# Patient Record
Sex: Male | Born: 1940 | Race: White | Hispanic: No | Marital: Married | Smoking: Former smoker
Health system: Southern US, Community
[De-identification: ages and names within clinical notes are randomized; demographics above are authoritative.]

## PROBLEM LIST (undated history)

## (undated) ENCOUNTER — Observation Stay (HOSPITAL_COMMUNITY)
Admission: RE | Payer: No Typology Code available for payment source | Source: Ambulatory Visit | Admitting: Cardiovascular Disease

## (undated) DIAGNOSIS — E669 Obesity, unspecified: Secondary | ICD-10-CM

## (undated) DIAGNOSIS — I2510 Atherosclerotic heart disease of native coronary artery without angina pectoris: Secondary | ICD-10-CM

## (undated) DIAGNOSIS — I739 Peripheral vascular disease, unspecified: Secondary | ICD-10-CM

## (undated) DIAGNOSIS — D509 Iron deficiency anemia, unspecified: Secondary | ICD-10-CM

## (undated) DIAGNOSIS — I219 Acute myocardial infarction, unspecified: Secondary | ICD-10-CM

## (undated) DIAGNOSIS — F1010 Alcohol abuse, uncomplicated: Secondary | ICD-10-CM

## (undated) DIAGNOSIS — E119 Type 2 diabetes mellitus without complications: Secondary | ICD-10-CM

## (undated) DIAGNOSIS — R112 Nausea with vomiting, unspecified: Secondary | ICD-10-CM

## (undated) DIAGNOSIS — M1990 Unspecified osteoarthritis, unspecified site: Secondary | ICD-10-CM

## (undated) DIAGNOSIS — Z9889 Other specified postprocedural states: Secondary | ICD-10-CM

## (undated) DIAGNOSIS — N400 Enlarged prostate without lower urinary tract symptoms: Secondary | ICD-10-CM

## (undated) DIAGNOSIS — I10 Essential (primary) hypertension: Secondary | ICD-10-CM

## (undated) DIAGNOSIS — E785 Hyperlipidemia, unspecified: Secondary | ICD-10-CM

## (undated) HISTORY — PX: ABDOMINAL HERNIA REPAIR: SHX539

## (undated) HISTORY — DX: Alcohol abuse, uncomplicated: F10.10

## (undated) HISTORY — PX: CORONARY ANGIOPLASTY WITH STENT PLACEMENT: SHX49

## (undated) HISTORY — PX: SKIN CANCER DESTRUCTION: SHX778

## (undated) HISTORY — PX: CAROTID ENDARTERECTOMY: SUR193

## (undated) HISTORY — DX: Iron deficiency anemia, unspecified: D50.9

## (undated) HISTORY — PX: SHOULDER ARTHROSCOPY W/ ROTATOR CUFF REPAIR: SHX2400

## (undated) HISTORY — DX: Obesity, unspecified: E66.9

## (undated) HISTORY — DX: Peripheral vascular disease, unspecified: I73.9

## (undated) HISTORY — PX: KNEE CARTILAGE SURGERY: SHX688

## (undated) HISTORY — DX: Atherosclerotic heart disease of native coronary artery without angina pectoris: I25.10

## (undated) HISTORY — PX: HERNIA REPAIR: SHX51

## (undated) HISTORY — DX: Benign prostatic hyperplasia without lower urinary tract symptoms: N40.0

## (undated) HISTORY — PX: UMBILICAL HERNIA REPAIR: SHX196

## (undated) HISTORY — DX: Hyperlipidemia, unspecified: E78.5

## (undated) HISTORY — PX: INGUINAL HERNIA REPAIR: SUR1180

## (undated) HISTORY — DX: Essential (primary) hypertension: I10

## (undated) HISTORY — PX: KNEE ARTHROSCOPY: SHX127

---

## 1966-05-31 HISTORY — PX: EYE SURGERY: SHX253

## 1993-05-31 DIAGNOSIS — I219 Acute myocardial infarction, unspecified: Secondary | ICD-10-CM

## 1993-05-31 HISTORY — DX: Acute myocardial infarction, unspecified: I21.9

## 1997-08-29 ENCOUNTER — Encounter: Admission: RE | Admit: 1997-08-29 | Discharge: 1997-11-27 | Payer: Self-pay | Admitting: Family Medicine

## 1998-05-16 ENCOUNTER — Emergency Department (HOSPITAL_COMMUNITY): Admission: EM | Admit: 1998-05-16 | Discharge: 1998-05-16 | Payer: Self-pay | Admitting: Emergency Medicine

## 2001-01-06 ENCOUNTER — Ambulatory Visit (HOSPITAL_COMMUNITY): Admission: RE | Admit: 2001-01-06 | Discharge: 2001-01-06 | Payer: Self-pay | Admitting: Gastroenterology

## 2001-01-06 ENCOUNTER — Encounter (INDEPENDENT_AMBULATORY_CARE_PROVIDER_SITE_OTHER): Payer: Self-pay | Admitting: *Deleted

## 2003-06-01 DIAGNOSIS — I2510 Atherosclerotic heart disease of native coronary artery without angina pectoris: Secondary | ICD-10-CM

## 2003-06-01 HISTORY — DX: Atherosclerotic heart disease of native coronary artery without angina pectoris: I25.10

## 2003-06-01 HISTORY — PX: CORONARY ARTERY BYPASS GRAFT: SHX141

## 2003-06-01 HISTORY — PX: CARDIAC CATHETERIZATION: SHX172

## 2004-01-30 ENCOUNTER — Ambulatory Visit (HOSPITAL_COMMUNITY): Admission: RE | Admit: 2004-01-30 | Discharge: 2004-01-30 | Payer: Self-pay | Admitting: Interventional Cardiology

## 2004-02-24 ENCOUNTER — Encounter: Admission: RE | Admit: 2004-02-24 | Discharge: 2004-02-24 | Payer: Self-pay | Admitting: Interventional Cardiology

## 2004-02-25 ENCOUNTER — Inpatient Hospital Stay (HOSPITAL_BASED_OUTPATIENT_CLINIC_OR_DEPARTMENT_OTHER): Admission: RE | Admit: 2004-02-25 | Discharge: 2004-02-25 | Payer: Self-pay | Admitting: Interventional Cardiology

## 2004-04-03 ENCOUNTER — Ambulatory Visit: Payer: Self-pay | Admitting: Oncology

## 2004-04-17 ENCOUNTER — Encounter: Admission: RE | Admit: 2004-04-17 | Discharge: 2004-04-17 | Payer: Self-pay | Admitting: Cardiothoracic Surgery

## 2004-04-27 ENCOUNTER — Inpatient Hospital Stay (HOSPITAL_COMMUNITY): Admission: RE | Admit: 2004-04-27 | Discharge: 2004-05-03 | Payer: Self-pay | Admitting: Cardiothoracic Surgery

## 2004-05-15 ENCOUNTER — Encounter: Admission: RE | Admit: 2004-05-15 | Discharge: 2004-05-15 | Payer: Self-pay | Admitting: Cardiothoracic Surgery

## 2004-05-19 ENCOUNTER — Ambulatory Visit: Payer: Self-pay | Admitting: Oncology

## 2004-06-04 ENCOUNTER — Encounter: Admission: RE | Admit: 2004-06-04 | Discharge: 2004-06-04 | Payer: Self-pay | Admitting: Cardiothoracic Surgery

## 2004-06-08 ENCOUNTER — Encounter (HOSPITAL_COMMUNITY): Admission: RE | Admit: 2004-06-08 | Discharge: 2004-09-06 | Payer: Self-pay | Admitting: Interventional Cardiology

## 2004-07-03 ENCOUNTER — Encounter: Admission: RE | Admit: 2004-07-03 | Discharge: 2004-07-03 | Payer: Self-pay | Admitting: Cardiothoracic Surgery

## 2004-07-07 ENCOUNTER — Ambulatory Visit: Payer: Self-pay | Admitting: Oncology

## 2004-08-24 ENCOUNTER — Ambulatory Visit: Payer: Self-pay | Admitting: Oncology

## 2004-09-07 ENCOUNTER — Ambulatory Visit: Payer: Self-pay | Admitting: Oncology

## 2004-11-02 ENCOUNTER — Ambulatory Visit: Payer: Self-pay | Admitting: Oncology

## 2005-02-11 ENCOUNTER — Ambulatory Visit: Payer: Self-pay | Admitting: Oncology

## 2005-04-08 ENCOUNTER — Ambulatory Visit: Payer: Self-pay | Admitting: Oncology

## 2005-06-09 ENCOUNTER — Ambulatory Visit: Payer: Self-pay | Admitting: Oncology

## 2005-09-17 ENCOUNTER — Encounter: Admission: RE | Admit: 2005-09-17 | Discharge: 2005-09-17 | Payer: Self-pay | Admitting: Family Medicine

## 2005-10-01 ENCOUNTER — Ambulatory Visit: Payer: Self-pay | Admitting: Oncology

## 2005-10-05 LAB — COMPREHENSIVE METABOLIC PANEL
ALT: 16 U/L (ref 0–40)
CO2: 24 mEq/L (ref 19–32)
Calcium: 9.2 mg/dL (ref 8.4–10.5)
Chloride: 97 mEq/L (ref 96–112)
Creatinine, Ser: 0.8 mg/dL (ref 0.4–1.5)
Sodium: 133 mEq/L — ABNORMAL LOW (ref 135–145)
Total Protein: 7.6 g/dL (ref 6.0–8.3)

## 2005-10-05 LAB — CBC WITH DIFFERENTIAL/PLATELET
BASO%: 0.4 % (ref 0.0–2.0)
HCT: 39.6 % (ref 38.7–49.9)
MCHC: 33.9 g/dL (ref 32.0–35.9)
MONO#: 0.5 10*3/uL (ref 0.1–0.9)
NEUT#: 3.7 10*3/uL (ref 1.5–6.5)
NEUT%: 64.1 % (ref 40.0–75.0)
WBC: 5.8 10*3/uL (ref 4.0–10.0)
lymph#: 1 10*3/uL (ref 0.9–3.3)

## 2005-10-05 LAB — LACTATE DEHYDROGENASE: LDH: 157 U/L (ref 94–250)

## 2006-02-01 ENCOUNTER — Ambulatory Visit: Payer: Self-pay | Admitting: Oncology

## 2006-02-03 LAB — COMPREHENSIVE METABOLIC PANEL
ALT: 11 U/L (ref 0–40)
AST: 19 U/L (ref 0–37)
Albumin: 4 g/dL (ref 3.5–5.2)
Alkaline Phosphatase: 53 U/L (ref 39–117)
BUN: 9 mg/dL (ref 6–23)
Calcium: 9 mg/dL (ref 8.4–10.5)
Chloride: 98 mEq/L (ref 96–112)
Potassium: 4 mEq/L (ref 3.5–5.3)
Sodium: 134 mEq/L — ABNORMAL LOW (ref 135–145)
Total Protein: 7.4 g/dL (ref 6.0–8.3)

## 2006-02-03 LAB — CBC WITH DIFFERENTIAL/PLATELET
BASO%: 0.3 % (ref 0.0–2.0)
EOS%: 8.1 % — ABNORMAL HIGH (ref 0.0–7.0)
LYMPH%: 19.4 % (ref 14.0–48.0)
MCHC: 33.8 g/dL (ref 32.0–35.9)
MONO#: 0.7 10*3/uL (ref 0.1–0.9)
Platelets: 239 10*3/uL (ref 145–400)
RBC: 4.58 10*6/uL (ref 4.20–5.71)
WBC: 5.9 10*3/uL (ref 4.0–10.0)

## 2006-02-03 LAB — IRON AND TIBC
%SAT: 19 % — ABNORMAL LOW (ref 20–55)
Iron: 80 ug/dL (ref 42–165)

## 2006-02-03 LAB — VITAMIN B12: Vitamin B-12: 233 pg/mL (ref 211–911)

## 2006-04-05 ENCOUNTER — Ambulatory Visit: Payer: Self-pay | Admitting: Oncology

## 2006-05-06 LAB — CBC WITH DIFFERENTIAL/PLATELET
BASO%: 0.3 % (ref 0.0–2.0)
Basophils Absolute: 0 10*3/uL (ref 0.0–0.1)
EOS%: 7.8 % — ABNORMAL HIGH (ref 0.0–7.0)
Eosinophils Absolute: 0.6 10*3/uL — ABNORMAL HIGH (ref 0.0–0.5)
HCT: 41.3 % (ref 38.7–49.9)
HGB: 13.9 g/dL (ref 13.0–17.1)
LYMPH%: 16.7 % (ref 14.0–48.0)
MCH: 30.3 pg (ref 28.0–33.4)
MCHC: 33.8 g/dL (ref 32.0–35.9)
MCV: 89.8 fL (ref 81.6–98.0)
MONO#: 0.6 10*3/uL (ref 0.1–0.9)
MONO%: 8.5 % (ref 0.0–13.0)
NEUT#: 5.1 10*3/uL (ref 1.5–6.5)
NEUT%: 66.7 % (ref 40.0–75.0)
Platelets: 237 10*3/uL (ref 145–400)
RBC: 4.59 10*6/uL (ref 4.20–5.71)
RDW: 13.9 % (ref 11.2–14.6)
WBC: 7.6 10*3/uL (ref 4.0–10.0)
lymph#: 1.3 10*3/uL (ref 0.9–3.3)

## 2006-05-06 LAB — IRON AND TIBC
%SAT: 25 % (ref 20–55)
Iron: 95 ug/dL (ref 42–165)
TIBC: 379 ug/dL (ref 215–435)
UIBC: 284 ug/dL

## 2006-05-06 LAB — VITAMIN B12: Vitamin B-12: 395 pg/mL (ref 211–911)

## 2006-05-28 ENCOUNTER — Ambulatory Visit: Payer: Self-pay | Admitting: Oncology

## 2006-07-29 ENCOUNTER — Ambulatory Visit: Payer: Self-pay | Admitting: Oncology

## 2006-08-03 LAB — COMPREHENSIVE METABOLIC PANEL
AST: 25 U/L (ref 0–37)
Albumin: 4.2 g/dL (ref 3.5–5.2)
Alkaline Phosphatase: 85 U/L (ref 39–117)
Calcium: 9.4 mg/dL (ref 8.4–10.5)
Chloride: 100 mEq/L (ref 96–112)
Potassium: 4.5 mEq/L (ref 3.5–5.3)
Sodium: 137 mEq/L (ref 135–145)
Total Protein: 7.7 g/dL (ref 6.0–8.3)

## 2006-08-03 LAB — CBC WITH DIFFERENTIAL/PLATELET
Basophils Absolute: 0 10*3/uL (ref 0.0–0.1)
EOS%: 9.3 % — ABNORMAL HIGH (ref 0.0–7.0)
Eosinophils Absolute: 0.6 10*3/uL — ABNORMAL HIGH (ref 0.0–0.5)
HGB: 14.6 g/dL (ref 13.0–17.1)
MCH: 30.6 pg (ref 28.0–33.4)
MCV: 89 fL (ref 81.6–98.0)
MONO%: 9 % (ref 0.0–13.0)
NEUT#: 4.6 10*3/uL (ref 1.5–6.5)
RBC: 4.79 10*6/uL (ref 4.20–5.71)
RDW: 13.1 % (ref 11.2–14.6)
lymph#: 0.9 10*3/uL (ref 0.9–3.3)

## 2006-08-03 LAB — VITAMIN B12: Vitamin B-12: 371 pg/mL (ref 211–911)

## 2006-08-03 LAB — FERRITIN: Ferritin: 35 ng/mL (ref 22–322)

## 2006-09-26 ENCOUNTER — Ambulatory Visit: Payer: Self-pay | Admitting: Oncology

## 2006-11-18 ENCOUNTER — Ambulatory Visit: Payer: Self-pay | Admitting: Oncology

## 2007-02-10 ENCOUNTER — Ambulatory Visit: Payer: Self-pay | Admitting: Oncology

## 2007-02-14 LAB — CBC WITH DIFFERENTIAL/PLATELET
Basophils Absolute: 0 10*3/uL (ref 0.0–0.1)
EOS%: 11 % — ABNORMAL HIGH (ref 0.0–7.0)
Eosinophils Absolute: 0.7 10*3/uL — ABNORMAL HIGH (ref 0.0–0.5)
HCT: 38.9 % (ref 38.7–49.9)
HGB: 13.8 g/dL (ref 13.0–17.1)
MONO#: 0.5 10*3/uL (ref 0.1–0.9)
NEUT#: 4.4 10*3/uL (ref 1.5–6.5)
RDW: 13.1 % (ref 11.2–14.6)
WBC: 6.7 10*3/uL (ref 4.0–10.0)
lymph#: 1 10*3/uL (ref 0.9–3.3)

## 2007-02-14 LAB — IRON AND TIBC: UIBC: 254 ug/dL

## 2007-02-14 LAB — COMPREHENSIVE METABOLIC PANEL
AST: 22 U/L (ref 0–37)
Albumin: 4.1 g/dL (ref 3.5–5.2)
BUN: 7 mg/dL (ref 6–23)
CO2: 24 mEq/L (ref 19–32)
Calcium: 8.8 mg/dL (ref 8.4–10.5)
Chloride: 98 mEq/L (ref 96–112)
Potassium: 4 mEq/L (ref 3.5–5.3)

## 2007-02-14 LAB — LACTATE DEHYDROGENASE: LDH: 152 U/L (ref 94–250)

## 2007-02-14 LAB — VITAMIN B12: Vitamin B-12: 301 pg/mL (ref 211–911)

## 2007-04-28 ENCOUNTER — Encounter: Admission: RE | Admit: 2007-04-28 | Discharge: 2007-04-28 | Payer: Self-pay | Admitting: Interventional Cardiology

## 2007-08-11 ENCOUNTER — Ambulatory Visit: Payer: Self-pay | Admitting: Oncology

## 2007-08-15 LAB — CBC WITH DIFFERENTIAL/PLATELET
BASO%: 0.5 % (ref 0.0–2.0)
Basophils Absolute: 0 10*3/uL (ref 0.0–0.1)
EOS%: 8.6 % — ABNORMAL HIGH (ref 0.0–7.0)
HGB: 13.9 g/dL (ref 13.0–17.1)
MCH: 30.6 pg (ref 28.0–33.4)
MONO#: 0.7 10*3/uL (ref 0.1–0.9)
RDW: 12.5 % (ref 11.2–14.6)
WBC: 8.2 10*3/uL (ref 4.0–10.0)
lymph#: 1.3 10*3/uL (ref 0.9–3.3)

## 2007-08-15 LAB — COMPREHENSIVE METABOLIC PANEL
ALT: 16 U/L (ref 0–53)
AST: 28 U/L (ref 0–37)
Albumin: 4 g/dL (ref 3.5–5.2)
BUN: 9 mg/dL (ref 6–23)
Calcium: 9.2 mg/dL (ref 8.4–10.5)
Chloride: 98 mEq/L (ref 96–112)
Potassium: 4.7 mEq/L (ref 3.5–5.3)

## 2007-08-15 LAB — VITAMIN B12: Vitamin B-12: 447 pg/mL (ref 211–911)

## 2008-02-13 ENCOUNTER — Ambulatory Visit: Payer: Self-pay | Admitting: Oncology

## 2008-02-15 LAB — CBC WITH DIFFERENTIAL/PLATELET
Basophils Absolute: 0 10*3/uL (ref 0.0–0.1)
Eosinophils Absolute: 0.8 10*3/uL — ABNORMAL HIGH (ref 0.0–0.5)
HGB: 13.9 g/dL (ref 13.0–17.1)
LYMPH%: 15.1 % (ref 14.0–48.0)
MONO#: 0.6 10*3/uL (ref 0.1–0.9)
NEUT#: 4.9 10*3/uL (ref 1.5–6.5)
Platelets: 200 10*3/uL (ref 145–400)
RBC: 4.5 10*6/uL (ref 4.20–5.71)
WBC: 7.4 10*3/uL (ref 4.0–10.0)

## 2008-02-15 LAB — COMPREHENSIVE METABOLIC PANEL
Albumin: 3.9 g/dL (ref 3.5–5.2)
BUN: 10 mg/dL (ref 6–23)
CO2: 25 mEq/L (ref 19–32)
Glucose, Bld: 112 mg/dL — ABNORMAL HIGH (ref 70–99)
Potassium: 4.4 mEq/L (ref 3.5–5.3)
Sodium: 135 mEq/L (ref 135–145)
Total Bilirubin: 0.5 mg/dL (ref 0.3–1.2)
Total Protein: 7.6 g/dL (ref 6.0–8.3)

## 2008-02-15 LAB — IRON AND TIBC
Iron: 85 ug/dL (ref 42–165)
UIBC: 219 ug/dL

## 2008-02-15 LAB — LACTATE DEHYDROGENASE: LDH: 162 U/L (ref 94–250)

## 2008-08-12 ENCOUNTER — Ambulatory Visit: Payer: Self-pay | Admitting: Oncology

## 2008-08-14 LAB — CBC WITH DIFFERENTIAL/PLATELET
BASO%: 0.3 % (ref 0.0–2.0)
Basophils Absolute: 0 10*3/uL (ref 0.0–0.1)
EOS%: 9.9 % — ABNORMAL HIGH (ref 0.0–7.0)
Eosinophils Absolute: 0.7 10*3/uL — ABNORMAL HIGH (ref 0.0–0.5)
HCT: 38.8 % (ref 38.4–49.9)
HGB: 13.3 g/dL (ref 13.0–17.1)
LYMPH%: 17.5 % (ref 14.0–49.0)
MCH: 30.5 pg (ref 27.2–33.4)
MCHC: 34.3 g/dL (ref 32.0–36.0)
MCV: 88.9 fL (ref 79.3–98.0)
MONO#: 0.7 10*3/uL (ref 0.1–0.9)
MONO%: 9.7 % (ref 0.0–14.0)
NEUT#: 4.3 10*3/uL (ref 1.5–6.5)
NEUT%: 62.6 % (ref 39.0–75.0)
Platelets: 188 10*3/uL (ref 140–400)
RBC: 4.36 10*6/uL (ref 4.20–5.82)
RDW: 13.1 % (ref 11.0–14.6)
WBC: 6.9 10*3/uL (ref 4.0–10.3)
lymph#: 1.2 10*3/uL (ref 0.9–3.3)

## 2008-08-14 LAB — COMPREHENSIVE METABOLIC PANEL
ALT: 20 U/L (ref 0–53)
Albumin: 3.9 g/dL (ref 3.5–5.2)
CO2: 27 mEq/L (ref 19–32)
Calcium: 9 mg/dL (ref 8.4–10.5)
Chloride: 99 mEq/L (ref 96–112)
Glucose, Bld: 168 mg/dL — ABNORMAL HIGH (ref 70–99)
Potassium: 4.4 mEq/L (ref 3.5–5.3)
Sodium: 135 mEq/L (ref 135–145)
Total Protein: 7.7 g/dL (ref 6.0–8.3)

## 2008-08-14 LAB — LACTATE DEHYDROGENASE: LDH: 157 U/L (ref 94–250)

## 2008-08-14 LAB — IRON AND TIBC: %SAT: 23 % (ref 20–55)

## 2009-02-12 ENCOUNTER — Ambulatory Visit: Payer: Self-pay | Admitting: Oncology

## 2009-02-14 LAB — COMPREHENSIVE METABOLIC PANEL
ALT: 15 U/L (ref 0–53)
AST: 21 U/L (ref 0–37)
Albumin: 3.8 g/dL (ref 3.5–5.2)
Alkaline Phosphatase: 79 U/L (ref 39–117)
BUN: 9 mg/dL (ref 6–23)
CO2: 23 mEq/L (ref 19–32)
Calcium: 8.8 mg/dL (ref 8.4–10.5)
Chloride: 97 mEq/L (ref 96–112)
Creatinine, Ser: 0.85 mg/dL (ref 0.40–1.50)
Glucose, Bld: 176 mg/dL — ABNORMAL HIGH (ref 70–99)
Potassium: 4 mEq/L (ref 3.5–5.3)
Sodium: 131 mEq/L — ABNORMAL LOW (ref 135–145)
Total Bilirubin: 0.5 mg/dL (ref 0.3–1.2)
Total Protein: 7.3 g/dL (ref 6.0–8.3)

## 2009-02-14 LAB — CBC WITH DIFFERENTIAL/PLATELET
BASO%: 0.3 % (ref 0.0–2.0)
Basophils Absolute: 0 10*3/uL (ref 0.0–0.1)
EOS%: 9.3 % — ABNORMAL HIGH (ref 0.0–7.0)
Eosinophils Absolute: 0.8 10*3/uL — ABNORMAL HIGH (ref 0.0–0.5)
HCT: 41.3 % (ref 38.4–49.9)
HGB: 14.2 g/dL (ref 13.0–17.1)
LYMPH%: 12.3 % — ABNORMAL LOW (ref 14.0–49.0)
MCH: 31 pg (ref 27.2–33.4)
MCHC: 34.4 g/dL (ref 32.0–36.0)
MCV: 90 fL (ref 79.3–98.0)
MONO#: 0.6 10*3/uL (ref 0.1–0.9)
MONO%: 7.1 % (ref 0.0–14.0)
NEUT#: 6 10*3/uL (ref 1.5–6.5)
NEUT%: 71 % (ref 39.0–75.0)
Platelets: 180 10*3/uL (ref 140–400)
RBC: 4.59 10*6/uL (ref 4.20–5.82)
RDW: 12.8 % (ref 11.0–14.6)
WBC: 8.5 10*3/uL (ref 4.0–10.3)
lymph#: 1.1 10*3/uL (ref 0.9–3.3)

## 2009-02-14 LAB — FERRITIN: Ferritin: 45 ng/mL (ref 22–322)

## 2009-02-14 LAB — VITAMIN B12: Vitamin B-12: 187 pg/mL — ABNORMAL LOW (ref 211–911)

## 2009-02-14 LAB — IRON AND TIBC
%SAT: 31 % (ref 20–55)
Iron: 108 ug/dL (ref 42–165)
TIBC: 344 ug/dL (ref 215–435)
UIBC: 236 ug/dL

## 2009-02-14 LAB — LACTATE DEHYDROGENASE: LDH: 153 U/L (ref 94–250)

## 2009-05-15 ENCOUNTER — Ambulatory Visit: Payer: Self-pay | Admitting: Oncology

## 2009-08-11 ENCOUNTER — Ambulatory Visit: Payer: Self-pay | Admitting: Oncology

## 2009-08-13 LAB — IRON AND TIBC
%SAT: 15 % — ABNORMAL LOW (ref 20–55)
Iron: 54 ug/dL (ref 42–165)
TIBC: 360 ug/dL (ref 215–435)
UIBC: 306 ug/dL

## 2009-08-13 LAB — CBC WITH DIFFERENTIAL/PLATELET
BASO%: 0.5 % (ref 0.0–2.0)
Basophils Absolute: 0 10*3/uL (ref 0.0–0.1)
EOS%: 10.4 % — ABNORMAL HIGH (ref 0.0–7.0)
Eosinophils Absolute: 0.8 10*3/uL — ABNORMAL HIGH (ref 0.0–0.5)
HCT: 39.5 % (ref 38.4–49.9)
HGB: 13.5 g/dL (ref 13.0–17.1)
LYMPH%: 14.8 % (ref 14.0–49.0)
MCH: 31.5 pg (ref 27.2–33.4)
MCHC: 34.2 g/dL (ref 32.0–36.0)
MCV: 91.9 fL (ref 79.3–98.0)
MONO#: 0.6 10*3/uL (ref 0.1–0.9)
MONO%: 8.2 % (ref 0.0–14.0)
NEUT#: 4.8 10*3/uL (ref 1.5–6.5)
NEUT%: 66.1 % (ref 39.0–75.0)
Platelets: 238 10*3/uL (ref 140–400)
RBC: 4.3 10*6/uL (ref 4.20–5.82)
RDW: 13 % (ref 11.0–14.6)
WBC: 7.3 10*3/uL (ref 4.0–10.3)
lymph#: 1.1 10*3/uL (ref 0.9–3.3)

## 2009-08-13 LAB — COMPREHENSIVE METABOLIC PANEL
ALT: 16 U/L (ref 0–53)
AST: 25 U/L (ref 0–37)
Albumin: 4 g/dL (ref 3.5–5.2)
Alkaline Phosphatase: 88 U/L (ref 39–117)
BUN: 12 mg/dL (ref 6–23)
CO2: 23 mEq/L (ref 19–32)
Calcium: 9 mg/dL (ref 8.4–10.5)
Chloride: 98 mEq/L (ref 96–112)
Creatinine, Ser: 0.81 mg/dL (ref 0.40–1.50)
Glucose, Bld: 214 mg/dL — ABNORMAL HIGH (ref 70–99)
Potassium: 4.3 mEq/L (ref 3.5–5.3)
Sodium: 133 mEq/L — ABNORMAL LOW (ref 135–145)
Total Bilirubin: 0.4 mg/dL (ref 0.3–1.2)
Total Protein: 7.4 g/dL (ref 6.0–8.3)

## 2009-08-13 LAB — FERRITIN: Ferritin: 91 ng/mL (ref 22–322)

## 2009-08-13 LAB — LACTATE DEHYDROGENASE: LDH: 164 U/L (ref 94–250)

## 2009-08-13 LAB — VITAMIN B12: Vitamin B-12: 1096 pg/mL — ABNORMAL HIGH (ref 211–911)

## 2010-02-10 ENCOUNTER — Ambulatory Visit: Payer: Self-pay | Admitting: Oncology

## 2010-03-13 ENCOUNTER — Ambulatory Visit: Payer: Self-pay | Admitting: Oncology

## 2010-03-17 LAB — CBC WITH DIFFERENTIAL/PLATELET
BASO%: 0.3 % (ref 0.0–2.0)
Basophils Absolute: 0 10*3/uL (ref 0.0–0.1)
EOS%: 10.5 % — ABNORMAL HIGH (ref 0.0–7.0)
Eosinophils Absolute: 0.9 10*3/uL — ABNORMAL HIGH (ref 0.0–0.5)
HCT: 39.3 % (ref 38.4–49.9)
HGB: 13.4 g/dL (ref 13.0–17.1)
LYMPH%: 14.7 % (ref 14.0–49.0)
MCH: 31.6 pg (ref 27.2–33.4)
MCHC: 34.2 g/dL (ref 32.0–36.0)
MCV: 92.2 fL (ref 79.3–98.0)
MONO#: 1 10*3/uL — ABNORMAL HIGH (ref 0.1–0.9)
MONO%: 11.2 % (ref 0.0–14.0)
NEUT#: 5.6 10*3/uL (ref 1.5–6.5)
NEUT%: 63.3 % (ref 39.0–75.0)
Platelets: 205 10*3/uL (ref 140–400)
RBC: 4.26 10*6/uL (ref 4.20–5.82)
RDW: 12.6 % (ref 11.0–14.6)
WBC: 8.9 10*3/uL (ref 4.0–10.3)
lymph#: 1.3 10*3/uL (ref 0.9–3.3)

## 2010-03-17 LAB — COMPREHENSIVE METABOLIC PANEL
ALT: 21 U/L (ref 0–53)
AST: 34 U/L (ref 0–37)
Albumin: 3.5 g/dL (ref 3.5–5.2)
Alkaline Phosphatase: 77 U/L (ref 39–117)
BUN: 7 mg/dL (ref 6–23)
CO2: 28 mEq/L (ref 19–32)
Calcium: 8.9 mg/dL (ref 8.4–10.5)
Chloride: 100 mEq/L (ref 96–112)
Creatinine, Ser: 0.96 mg/dL (ref 0.40–1.50)
Glucose, Bld: 114 mg/dL — ABNORMAL HIGH (ref 70–99)
Potassium: 4.4 mEq/L (ref 3.5–5.3)
Sodium: 135 mEq/L (ref 135–145)
Total Bilirubin: 0.8 mg/dL (ref 0.3–1.2)
Total Protein: 7.5 g/dL (ref 6.0–8.3)

## 2010-03-17 LAB — LACTATE DEHYDROGENASE: LDH: 171 U/L (ref 94–250)

## 2010-03-17 LAB — IRON AND TIBC
%SAT: 21 % (ref 20–55)
Iron: 65 ug/dL (ref 42–165)
TIBC: 316 ug/dL (ref 215–435)
UIBC: 251 ug/dL

## 2010-03-17 LAB — FERRITIN: Ferritin: 116 ng/mL (ref 22–322)

## 2010-03-17 LAB — VITAMIN B12: Vitamin B-12: 369 pg/mL (ref 211–911)

## 2010-06-20 ENCOUNTER — Encounter: Payer: Self-pay | Admitting: Cardiothoracic Surgery

## 2010-06-21 ENCOUNTER — Encounter: Payer: Self-pay | Admitting: Interventional Cardiology

## 2010-10-16 NOTE — Procedures (Signed)
[place]. [facility]  Patient:    [name], [name]                     MRN: [id number] Proc. Date: 01/06/01 Adm. Date:  [id number] Attending:  Rich Brave CC:         Desma Maxim, M.D.   Procedure Report  PROCEDURE:  Colonoscopy with biopsies.  INDICATION:  Screening for colon cancer in a 70 year old gentleman.  FINDINGS:  Several diminutive polyps present.  Scattered diverticulosis.  DESCRIPTION OF PROCEDURE:  The nature, purpose, and risks of the procedure had been discussed with the patient, who provided written consent.  Sedation was Phenergan 25 mg IV, followed by fentanyl 100 mcg and Versed 6 mg, without arrhythmias other than some mild bradycardia, or desaturation.  Digital exam of the prostate was normal.  The Olympus adult video colonoscope was advanced to the cecum as identified by visualization of the appendiceal orifice without significant difficulty, and pullback was then performed.  On the way in, I encountered a sessile 2 x 3 mm polyp in the general vicinity of the hepatic flexure, removed by several cold biopsies.  On the way out, I encountered several hyperplastic-appearing diminutive sessile 2 mm polyps, each removed by a single cold biopsy.  No large polyps, cancer, colitis, or vascular malformations were seen.  The patient did have scattered diverticular disease throughout the colon, especially in the left and the right colon, not so much in the transverse colon.  Retroflexion was not performed in the rectum due to the proximity of the biopsies of some small rectal hyperplastic-appearing polyps.  Careful antegrade viewing, however, disclosed no significant lesions.  The patient tolerated the procedure well, and there were no apparent complications.  He did not experience nausea or vomiting within 30 minutes following this procedure as he had following anesthesia for other minor surgical  procedures.  IMPRESSION: 1. Diminutive colonic polyps, pathology pending. 2. Scattered diverticulosis.  PLAN:  Await pathology.  Colonoscopic follow-up in three to five years depending on the histologic findings.DD:  01/06/01 TD:  01/07/01 Job: [id number]

## 2010-10-16 NOTE — Cardiovascular Report (Signed)
[name], [name]              ACCOUNT NO.:  [id number]   MEDICAL RECORD NO.:  [id number]          PATIENT TYPE:  OIB   LOCATION:  6501                         FACILITY:  [facility]   PHYSICIAN:  Lyn Records III, M.D.DATE OF BIRTH:  Jan 04, 1941   DATE OF PROCEDURE:  02/25/2004  DATE OF DISCHARGE:                              CARDIAC CATHETERIZATION   INDICATION:  The patient is diabetic, has history of coronary disease, and  is status post right coronary angioplasty in 1997 due to an acute inferior  infarction.  Recent surveillance Cardiolite study demonstrated anterior  ischemia.  This is a new finding in this patient who is known to have at the  time of angioplasty of the right coronary residual 50-70% proximal LAD  disease.  This study is being done to define anatomy and to look for  evidence of progression of disease.   PROCEDURE PERFORMED:  1.  Left heart catheterization.  2.  Selective coronary angiography.  3.  Left ventriculography.   DESCRIPTION:  After informed consent, a 4-French sheath was placed in the  right femoral artery using modified Seldinger technique.  A 4-French A2  multipurpose catheter was used for hemodynamic recordings, left  ventriculography by hand injection and selective right and left coronary  angiography.  We switched to a 4 left Judkins catheter, 4 [place].  200 mcg  of intracoronary nitroglycerin was administered.  The angiograms were then  reperformed on the LAD.  The patient tolerated the procedure without  complications.   RESULTS:   I. HEMODYNAMIC DATA:  A.  Aortic pressure 172/72.  B.  Left ventricular pressure 174/23.   II. LEFT VENTRICULOGRAPHY:  The left ventricle demonstrates inferobasal  severe hypokinesis.  EF 55%.  No significant MR.   III. CORONARY ANGIOGRAPHY:  Both the left and right coronary are heavily  calcified.  The left coronary in particular is calcified.  Both in the  circumflex, proximal LAD and in the left main.   A.  Left main coronary:  Mild proximal narrowing, perhaps 20%.  No high  grade obstruction.  B.  Left anterior descending coronary:  The LAD is a large vessel that wraps  around the left ventricular apex.  There is a segmental region of disease  starting in the proximal LAD near the origin of the first diagonal/ramus  branch and extending to the second diagonal/second septal perforator with  segmental narrowing in the 80-90% range.  The first diagonal contains  eccentric 70% narrowing.  The second diagonal contains ostial 90% narrowing.  The LAD beyond the second septal perforator is narrowed to 50% and then  becomes a large vessel that courses around the left ventricular apex.  C.  Circumflex artery:  Circumflex artery is a large vessel that gives  origin to two obtuse marginal branches.  There is 30-40% proximal circumflex  narrowing.  The second obtuse marginal branch is large, distal and free of  significant obstruction.  D.  Right coronary:  The right coronary artery contains irregularities in  the mid vessel with up to 50-60% narrowing.  Just proximal to the first  acute  marginal branch, there is 60-70% narrowing.  No collaterals are noted.  The PDA is large.  Two left ventricular branches are large.  No significant  obstruction is seen.   CONCLUSIONS:  1.  Severe two-vessel coronary disease with high grade diffuse narrowing in      a heavily calcified proximal left anterior descending obstructing the      artery up to 85-90% involving the first and second diagonals.  The first      diagonal is a large near ramus-appearing vessel that contains 70%      obstruction.  The right coronary contains borderline significant      proximal disease in the region of prior angioplasty.  Posterior descending artery and left ventricular branches are free of  significant obstruction.  The circumflex artery contains no significant  obstruction.  1.  Decreased left ventricular function with  inferobasal severe hypokinesis.   PLAN:  The patient has significant anemia at 9.1.  This needs to be  evaluated.  We will give consideration to left coronary percutaneous  intervention that will be at higher than normal risk versus coronary artery  bypass grafting.  Will discuss this with the patient and his family and  proceed accordingly after the anemia has been properly evaluated.       HWS/MEDQ  D:  02/25/2004  T:  02/25/2004  Job:  [id number]   cc:   Donia Guiles, M.D.  [address] [place]  [place] [address]  Fax: [phone]

## 2010-10-16 NOTE — Op Note (Signed)
NAMEARGYLE, GUSTAFSON              ACCOUNT NO.:  [id number]   MEDICAL RECORD NO.:  [id number]          PATIENT TYPE:  INP   LOCATION:  2302                         FACILITY:  [facility]   PHYSICIAN:  Kathlee Nations Trigt III, M.D.DATE OF BIRTH:  Jan 10, 1941   DATE OF PROCEDURE:  04/27/2004  DATE OF DISCHARGE:                                 OPERATIVE REPORT   OPERATION:  Coronary artery bypass grafting x3. (Left internal mammary  artery to LAD; saphenous vein graft to ramus intermediate; saphenous vein  graft to right coronary artery).   PREOPERATIVE DIAGNOSIS:  Class 3 progressive angina with severe three-vessel  coronary artery disease.   POSTOPERATIVE DIAGNOSIS:  Class 3 progressive angina with severe three-  vessel coronary artery disease.   SURGEON:  Kerin Perna, M.D.   ASSISTANT:  Jerold Coombe, P.A.   ANESTHESIA:  General.   ANESTHESIOLOGIST:  Judie Petit, M.D.   INDICATIONS:  The patient is a 70 year old obese male diabetic, who  presented with recurrent dyspnea on exertion, following a right coronary  stent procedure approximately one year ago.  Cardiac catheterization  demonstrated progression in his native coronary disease, with a patent  stent.  He had a 90% stenosis at the LAD-ramus intermediate bifurcation, and  a 70-80% stenosis in the mid right coronary.  His circumflex vessels had  mild disease, less than 30%.  His ejection fraction was fairly well  preserved, and he was felt to be a candidate for surgical revascularization  -- based on his symptoms and coronary anatomy.   Prior to surgery, I examined the patient in the office on two occasions.  Reviewed the results of the cardiac catheterization with the patient and his  wife.  I discussed the indications and expected benefits of coronary bypass  surgery for treatment of his coronary artery disease.  I discussed the  alternatives to surgical therapy for treatment of his coronary artery  disease as  well.  I reviewed with the patient and wife the major aspects of  the planned procedure, including the choice of conduits to include mammary  artery and saphenous vein, the location of the surgical incisions, the use  of general anesthesia and cardiopulmonary bypass, and the expected  postoperative hospital recovery.  I discussed with the patient the risks of  coronary artery bypass surgery, including the risks of MI, CVA, bleeding,  blood transfusion, infection and death.  The patient understood that because  of his preoperative anemia he was at high risk to require blood transfusion  therapy.  After reviewing all these factors, the patient indicated his  understanding of the benefits and risks of surgery, and agreed to proceed  with the operation under what I felt was an informed consent.   OPERATIVE FINDINGS:  The patient's body habitus made exposure of the back of  the heart difficult.  The coronaries had severe calcified disease, with  severe calcification of the posterior aspect of the entire right coronary  and diffuse disease of the LAD, with a posterior plaque as well.   DESCRIPTION OF PROCEDURE:  The saphenous vein was harvested endoscopically  from the right thigh.  It was exposed at the left thigh, but was a poor  conduit vessel.  The mammary artery was a good vessel with excellent flow.  The patient did not receive any blood products in the operating room.   The patient was brought to the operating room and placed supine on the  operating room table, where general anesthesia was induced under invasive  hemodynamic monitoring.  The chest, abdomen and legs were prepped with  Betadine and draped as a sterile field.  A sternal incision was made and the  saphenous vein was harvested from the right leg.  The left internal mammary  artery was harvested as a pedicle graft from its origin at the subclavian  vessels.  It was a good vessel with excellent flow.  Heparin was   administered and the ACT was documented as being therapeutic.  The sternal  retractor was placed using the deep blades.  The pericardium was opened and  suspended.  Pursestrings were placed in the ascending aorta and right  atrium, and the patient was cannulated and placed on bypass.  The coronaries  were identified for grafting and the mammary artery and vein grafts were  prepared for the distal anastomoses.  Cardioplegia cannula was replaced for  both antegrade aortic and retrograde coronary sinus cardioplegia.  The  patient was cooled to 32 degrees. The aortic crossclamp was applied.  Then  850 cc of cold blood cardioplegia was delivered in split doses, between the  antegrade aortic and retrograde coronary sinus catheters.  There was a good  cardioplegic arrest, with septal temperature dropping less than 12 degrees.  Topical iced saline was used to augment myocardial preservation, and a  pericardial insulator pad was used to insulate the heart and protect the  left phrenic nerve.   The distal coronary anastomoses were then performed.  The first distal  anastomosis was at the distal right.  This a 2.0 mm vessel with proximal 75%  stenosis.  It had a very heavily calcified posterior wall.  A reverse  saphenous vein was sewn end-to-side with running 7-0 Prolene, with good flow  through the graft.   The second distal anastomosis was the ramus intermediate.  This was a poor  target.  It was intramyocardial.  It was dissected out and had diffuse  calcium.  It was a 1.4 mm vessel in diameter.  A reverse saphenous vein was  sewn end-to-side with running 7-0 Prolene to this thin-walled vessel, and  there was adequate flow through the graft.   After the first anastomosis there was some bleeding, where some sutures had  pulled through on the heel end of the anastomosis.  The median anastomosis  was taken down and redone, using another 7-0 Prolene.  With this anastomosis there was excellent flow  and there was  hemostasis.  Cardioplegia was redosed.   The third distal anastomosis was to the distal aspect of the LAD.  Here it  was a 1.5 mm vessel and was more proximally and distally under a deep layer  of epicardial fat.  The left IMA with the pedicle was brought out through an  opening created; and then the left lateral pericardium was brought down onto  the LAD and sewn end-to-side with running 8-0 Prolene.  There was excellent  flow through the anastomosis, after briefly releasing the bulldog clamp on  the mammary pedicle.  The vascular bulldog was reapplied and the pedicle was  secured to the epicardium.  Cardioplegia was redosed.   While the crossclamp was still in place, two proximal vein anastomoses were  placed on the ascending aorta, using a 4.0 mm punch with running 6-0  Prolene.  The crossclamp was then removed, after the air had been vented  from the coronaries and ascending aorta.   The heart was cardioverted back to a regular rhythm.  Air was aspirated from  the vein grafts, using a 27-gauge needle.  The proximal and distal  anastomoses were checked and found to be hemostatic.  The heart resumed a  vigorous contraction.  The patient was rewarmed to 37 degrees.  Temporary  pacing wires were applied to the right atrium and right ventricle.  The  lungs were expanded and ventilator was resumed.  The patient was then weaned  from bypass, after reaching 37 degrees.  EKG and hemodynamics were stable.  Protamine was administered without adverse reaction, and the cannulae were  removed.  The mediastinum was inspected and found to be hemostatic; it was  irrigated with warm saline.  The leg incision was irrigated and closed in a  standard fashion.  The superior pericardial and mediastinal fat was closed  over the aorta and vein grafts.  Two mediastinal and a left pleural chest  tube were placed, and then brought out through separate incisions.  The  sternum was closed with  interrupted steel wire.  The pectoralis fascia was  closed with a running #1 Vicryl. The subcutaneous and skin layers were  closed with a running Vicryl.   TOTAL BYPASS TIME:  130 min.   CROSSCLAMP TIME:  (to perform the distal and proximal anastomoses)  84 min.      Pete   PV/MEDQ  D:  04/27/2004  T:  04/27/2004  Job:  [id number]   cc:   Lyn Records III, M.D.  [address]  [address]  [place]  [place] [address]  Fax: [phone]

## 2010-10-16 NOTE — Discharge Summary (Signed)
NAMEBREWER, HITCHMAN NO.:  [id number]   MEDICAL RECORD NO.:  [id number]          PATIENT TYPE:  INP   LOCATION:  2035                         FACILITY:  [facility]   PHYSICIAN:  Kerin Perna, M.D.  DATE OF BIRTH:  Jan 01, 1941   DATE OF ADMISSION:  04/27/2004  DATE OF DISCHARGE:  05/03/2004                                 DISCHARGE SUMMARY   ADMITTING DIAGNOSES:  Two vessel coronary artery disease.   DISCHARGE/SECONDARY DIAGNOSES:  1.  Two vessel coronary artery disease status post coronary artery bypass      graft.  2.  Postoperative atrial fibrillation with rapid ventricular response now in      sinus rhythm on amiodarone.  3.  History of anemia with negative upper and lower endoscopy.      Postoperative anemia as well status post transfusion.  4.  Obesity.  5.  Diabetes mellitus type 2.  6.  Hypertension.  7.  Hyperlipidemia.  8.  Remote history of smoking with 40-pack-year history.  9.  Peripheral vascular disease with intermittent claudication with last ABI      0.8 bilaterally.   ALLERGIES:  MYCINS which cause rash.   PROCEDURES:  On April 27, 2004 he underwent coronary artery bypass  grafting x3 using a left internal mammary artery to the LAD, saphenous vein  graft to the ramus intermediate, saphenous vein graft to the right coronary  artery, endoscopic vein harvesting from the right thigh.  Surgeon Dr. Kathlee Nations Trigt.   BRIEF HISTORY:  Mr. [name] is a 70 year old Caucasian male with history of  diabetes who underwent a cardiac catheterization earlier this fall by Dr.  Verdis Prime.  He had a history of coronary disease with an inferior  myocardial infarction in 1995 which was treated with a proximal right  coronary artery stent.  Recently, he developed dyspnea on exertion and was  evaluated for recurrent coronary artery disease which led to his cardiac  catheterization and a diagnosis of severe LAD diagonal stenosis of 90% and  60% stenosis of  the right coronary stent.  His ejection fraction was 55%  with an elevated LVEDP of 23 mmHg.  At the time of his cardiac  catheterization his hemoglobin was 9 and he underwent evaluation by Dr.  Matthias Hughs with negative upper and lower endoscopy.  He was also evaluated by  Dr. 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  He has  been taking iron and B12 shots and his hemoglobin had increased to 9.5.  Once his hemoglobin stabilized he was felt appropriate for surgical  revascularization and he was seen by Dr. Kathlee Nations Trigt in the CVTS office  in early November.  After examining the patient and review of his cardiac  catheterization findings, Dr. Donata Clay did feel that coronary artery bypass  grafting was the best treatment option and his surgery was tentatively  scheduled for April 27, 2004.   HOSPITAL COURSE:  On April 27, 2004 Mr. Prusinski was electively admitted  to [facility] and did undergo coronary artery bypass grafting as  discussed  above.  Overall, he was felt to tolerate the procedure well and  was transferred to the surgical intensive care unit in stable condition.  He  did require postoperative transfusion for a hemoglobin of 7.2.  Later that  evening he remained hemodynamically stable and was extubated neurologically  intact.  The following day he remained stable and atrial paced at 80.  Chest  x-ray showed mild basilar atelectasis as Pleurovac was positive for air  leak.  Otherwise, his laboratories remained stable.  He was started on  diuretic therapy for a mild fluid volume excess.  On postoperative day two  he continued to do well.  His chest tubes were discontinued without  incident.  He had been maintaining sinus rhythm.  However, late that evening  he did develop atrial fibrillation with rapid ventricular response with a  rate up into 160s.  He was started on IV Cardizem as well as amiodarone.  Shortly thereafter he did convert back to  sinus rhythm.  Postoperative day  three he had been transferred out of the surgical intensive care unit and  out to the floor.  He was maintaining a sinus rhythm on the previous  mentioned drips with a rate in the 60s and 70s.  By this point he had been  weaned from supplemental oxygen and was saturating above 90%.  His  hemoglobin and hematocrit were decreased, but stable and improved at 8.8 and  25.9, respectively.  He was continued on iron supplement.  He was tolerating  oral diet and the pain was controlled with oral medication.  His bowel and  bladder function were working appropriately.  His diabetes mellitus, fair  controlled and were primarily running less than 200 on a regimen of Lantus  and Avandia.  It was felt that at discharge the Lantus could be discontinued  and he could resume his home regimen of Glucophage as well.  By  postoperative day four he was maintaining sinus rhythm.  He was now on oral  amiodarone and the Cardizem had been discontinued.  He was maintaining sinus  rhythm with a rate in the 60s-80s.  His systolic blood pressure was ranging  from about 110-140 with diastolic ranging in the 60s and 70s.  He was  afebrile and saturating 95% on room air.  On examination his heart had a  regular rate and rhythm, but with fairly distant heart sounds.  His lung  sounds were clear.  His abdominal examination was rather benign.  His  extremities did show mild edema and his sternal and right leg incisions were  healing nicely without signs of infection.  His mobilization was also  increasing with cardiac rehabilitation.  He was ambulating in the hallways  and noted to have a steady gait.  It was felt that if he continued to make  good progression and his heart rhythm remained stable that he would be ready  for discharge within the next couple days.  His anticipated date of  discharge is May 03, 2004 pending no significant changes in his status.  LABORATORY DATA:  At the  time of this dictation his most recent laboratories  show a white blood count of 5.9, hemoglobin 8.8, hematocrit 25.9, platelet  count 138.  Sodium 134, potassium 3.7, chloride 102, CO2 28, BUN 10,  creatinine 0.7, glucose 137.  SGOT 29, SGPT 22, alkaline phosphatase 72,  total bilirubin 0.7, albumin 3.9.  Hemoglobin A1C 6.4.   DISCHARGE MEDICATIONS:  1.  Enteric-coated aspirin 325 mg  one p.o. daily.  2.  Lopressor 25 mg one p.o. b.i.d.  3.  Lipitor 20 mg one p.o. daily.  4.  Avandia 8 mg one p.o. q.h.s.  5.  Glucophage 1000 mg b.i.d.  6.  Niacin daily (home dose).  7.  Ferrous sulfate 325 mg daily.  8.  Vitamin B12 injections monthly.  9.  Amiodarone 200 mg one p.o. b.i.d.  10. Lasix 40 mg one p.o. daily x5 days.  11. K-Dur 20 mEq daily x5 days.  12. Darvocet-N 100 one to two tablets p.o. q.4-6h. p.r.n. pain.   ACTIVITY:  He is to avoid driving and heavy lifting of more than 10 pounds.  He is encouraged to continue daily walking and breathing exercises.   DIET:  He is to follow a low fat, low salt, carbohydrate modified diet.   WOUND CARE:  He may shower and clean his incisions daily with mild soap and  water.  He should notify the CVTS office if he develops fever greater than  101 or redness or drainage from his incision site.   FOLLOWUP:  He is to follow up with Dr. Kathlee Nations Trigt at the CVTS office in  three weeks.  The CVTS office will contact him regarding a specific  appointment time.  He was to call [phone] to schedule two-week follow-up  with Dr. Verdis Prime.  He is to have a chest x-ray taken at this appointment  and was instructed to bring this chest x-ray film with him to the  appointment with Dr. Donata Clay.      Revonda Standard   AWZ/MEDQ  D:  05/01/2004  T:  05/02/2004  Job:  [id number]   cc:   Lesleigh Noe, M.D.  [address]  [address]  [place]  [place] [address]  Fax: [phone]   Donia Guiles, M.D.  [address] [place]  [place] [address]   Fax: [phone]

## 2013-03-23 ENCOUNTER — Other Ambulatory Visit: Payer: Self-pay | Admitting: Physician Assistant

## 2013-03-23 ENCOUNTER — Ambulatory Visit
Admission: RE | Admit: 2013-03-23 | Discharge: 2013-03-23 | Disposition: A | Discharge status: Home / Self Care | Payer: Federal, State, Local not specified - PPO | Source: Ambulatory Visit | Attending: Physician Assistant | Admitting: Physician Assistant

## 2013-03-23 DIAGNOSIS — R0781 Pleurodynia: Secondary | ICD-10-CM

## 2013-07-31 ENCOUNTER — Encounter: Payer: Self-pay | Admitting: Interventional Cardiology

## 2013-09-05 ENCOUNTER — Ambulatory Visit: Payer: Self-pay | Admitting: Interventional Cardiology

## 2013-09-26 ENCOUNTER — Encounter: Payer: Self-pay | Admitting: Interventional Cardiology

## 2013-09-26 ENCOUNTER — Encounter (INDEPENDENT_AMBULATORY_CARE_PROVIDER_SITE_OTHER): Payer: Self-pay

## 2013-09-26 ENCOUNTER — Ambulatory Visit (INDEPENDENT_AMBULATORY_CARE_PROVIDER_SITE_OTHER): Payer: Federal, State, Local not specified - PPO | Admitting: Interventional Cardiology

## 2013-09-26 VITALS — BP 150/62 | HR 53 | Ht 68.0 in | Wt 193.0 lb

## 2013-09-26 DIAGNOSIS — E785 Hyperlipidemia, unspecified: Secondary | ICD-10-CM

## 2013-09-26 DIAGNOSIS — E119 Type 2 diabetes mellitus without complications: Secondary | ICD-10-CM | POA: Insufficient documentation

## 2013-09-26 DIAGNOSIS — I10 Essential (primary) hypertension: Secondary | ICD-10-CM

## 2013-09-26 DIAGNOSIS — I25810 Atherosclerosis of coronary artery bypass graft(s) without angina pectoris: Secondary | ICD-10-CM

## 2013-09-26 DIAGNOSIS — Z951 Presence of aortocoronary bypass graft: Secondary | ICD-10-CM | POA: Insufficient documentation

## 2013-09-26 DIAGNOSIS — I2510 Atherosclerotic heart disease of native coronary artery without angina pectoris: Secondary | ICD-10-CM

## 2013-09-26 NOTE — Progress Notes (Signed)
Patient ID: [name], male   DOB: 10/17/1940, 73 y.o.   MRN: [id number]    [address], [address] [place], [place]  [address] Phone: [phone] Fax:  [phone]  Date:  09/26/2013   ID:  [name], DOB 28-Dec-1940, MRN [id number]  PCP:  Lupita Raider, MD   ASSESSMENT:  1. CAD with bypass her 1995, asymptomatic 2. Hypertension, with only borderline control in office today. He states that at home his pressures are usually less than 140/90. 3. Hyperlipidemia  PLAN:  1. Active lifestyle 2. No change in therapy 3. One-year followup 4. Notify if chest discomfort was dramatic change in exertional tolerance   SUBJECTIVE: [name] is a 73 y.o. male who is doing well. His only limitation is burning and aching in his thighs and buttocks with activity. This is been an ongoing complaint now over several years. Doppler arterial evaluation has not demonstrated significant abnormality. He denies palpitations, orthopnea, PND, angina, dyspnea, and orthopnea. No medication side effects.   Wt Readings from Last 3 Encounters:  09/26/13 193 lb (87.544 kg)     No past medical history on file.  Current Outpatient Prescriptions  Medication Sig Dispense Refill  . amLODipine (NORVASC) 10 MG tablet Take 1/2 tab daily      . aspirin 81 MG tablet Take 81 mg by mouth daily.      . ferrous sulfate 325 (65 FE) MG tablet Take 325 mg by mouth daily with breakfast.      . furosemide (LASIX) 40 MG tablet Take 1 tab daily      . losartan (COZAAR) 50 MG tablet Take 1 tab daily      . metFORMIN (GLUCOPHAGE) 1000 MG tablet 1 tab twice a day      . metoprolol (LOPRESSOR) 100 MG tablet Take 1 tab twice a day      . Niacin CR 1000 MG TBCR Take by mouth. Take 1 tab twice a day      . nitroGLYCERIN (NITROSTAT) 0.4 MG SL tablet Place 0.4 mg under the tongue every 5 (five) minutes as needed for chest pain.      Marland Kitchen ZETIA 10 MG tablet Take 1 tab daily       No current facility-administered  medications for this visit.    Allergies:    Allergies  Allergen Reactions  . Fish Oil     Social History:  The patient  reports that he has quit smoking. He does not have any smokeless tobacco history on file.   ROS:  Please see the history of present illness.   No transient neurological symptomshe has not had syncope   All other systems reviewed and negative.   OBJECTIVE: VS:  BP 150/62  Pulse 53  Ht 5\' 8"  (1.727 m)  Wt 193 lb (87.544 kg)  BMI 29.35 kg/m2 Well nourished, well developed, in no acute distress, obese HEENT: normal Neck: JVD elderlyflat. Carotid bruit absent  Cardiac:  normal S1, S2; RRR; no murmur Lungs:  clear to auscultation bilaterally, no wheezing, rhonchi or rales Abd: soft, nontender, no hepatomegaly Ext: Edema absent. Pulses 2+ femoral and popliteal pulses Skin: warm and dry Neuro:  CNs 2-12 intact, no focal abnormalities noted  EKG:  Normal sinus rhythm with normal appearing tracing with mild left axis deviation       Signed, Darci Needle III, MD 09/26/2013 10:26 AM

## 2013-09-26 NOTE — Patient Instructions (Signed)
Your physician recommends that you continue on your current medications as directed. Please refer to the Current Medication list given to you today.  Your physician discussed the importance of regular exercise and recommended that you start or continue a regular exercise program for good health.   Your physician wants you to follow-up in: 1 year You will receive a reminder letter in the mail two months in advance. If you don't receive a letter, please call our office to schedule the follow-up appointment.  

## 2013-11-06 ENCOUNTER — Encounter: Payer: Self-pay | Admitting: Interventional Cardiology

## 2014-09-24 ENCOUNTER — Encounter: Payer: Self-pay | Admitting: *Deleted

## 2014-09-27 ENCOUNTER — Ambulatory Visit (INDEPENDENT_AMBULATORY_CARE_PROVIDER_SITE_OTHER): Payer: Federal, State, Local not specified - PPO | Admitting: Interventional Cardiology

## 2014-09-27 ENCOUNTER — Encounter: Payer: Self-pay | Admitting: Interventional Cardiology

## 2014-09-27 VITALS — BP 162/60 | HR 54 | Ht 68.0 in | Wt 152.1 lb

## 2014-09-27 DIAGNOSIS — R809 Proteinuria, unspecified: Secondary | ICD-10-CM

## 2014-09-27 DIAGNOSIS — I10 Essential (primary) hypertension: Secondary | ICD-10-CM

## 2014-09-27 DIAGNOSIS — E119 Type 2 diabetes mellitus without complications: Secondary | ICD-10-CM

## 2014-09-27 DIAGNOSIS — N184 Chronic kidney disease, stage 4 (severe): Secondary | ICD-10-CM | POA: Insufficient documentation

## 2014-09-27 DIAGNOSIS — I25810 Atherosclerosis of coronary artery bypass graft(s) without angina pectoris: Secondary | ICD-10-CM | POA: Diagnosis not present

## 2014-09-27 DIAGNOSIS — E785 Hyperlipidemia, unspecified: Secondary | ICD-10-CM | POA: Diagnosis not present

## 2014-09-27 NOTE — Progress Notes (Signed)
Cardiology Office Note   Date:  09/27/2014   ID:  Dnaiel, Dehring [birth year], MRN [id number]  PCP:  Lupita Raider, MD  Cardiologist:   Lesleigh Noe, MD   Chief Complaint  Patient presents with  . Medication Refill  . Coronary Artery Disease      History of Present Illness: SAGEN AREOLA is a 74 y.o. male who presents for hyperlipidemia, hypertension, coronary artery disease, previous bypass surgery, proteinuria, and statin intolerance.  He denies chest discomfort and dyspnea. There is no orthopnea or lower extremity swelling. He continues to play golf and to be physically active without limitations. He is concerned about his lipid levels. He wants to be off Zetia because of cost. We have tried statins in the past but they caused myopathy.  Past Medical History  Diagnosis Date  . CAD (coronary artery disease)   . HTN (hypertension)   . Hyperlipemia   . Diabetes mellitus type 2 with peripheral artery disease   . Peripheral vascular disease   . Benign prostatic hyperplasia   . Iron deficiency anemia   . Alcohol abuse   . Obesity     No past surgical history on file.   Current Outpatient Prescriptions  Medication Sig Dispense Refill  . amLODipine (NORVASC) 10 MG tablet Take 5 mg by mouth daily. Patient taking one half tablet by mouth daily    . aspirin 81 MG tablet Take 81 mg by mouth daily.    . ferrous sulfate 325 (65 FE) MG tablet Take 325 mg by mouth daily with breakfast.    . furosemide (LASIX) 40 MG tablet Take 40 mg by mouth daily.    Marland Kitchen losartan (COZAAR) 50 MG tablet Take 50 mg by mouth daily.    . metFORMIN (GLUCOPHAGE) 1000 MG tablet Take 1,000 mg by mouth 2 (two) times daily with a meal.    . metoprolol (LOPRESSOR) 100 MG tablet Take 100 mg by mouth 2 (two) times daily.    . Niacin CR 1000 MG TBCR Take by mouth. Take 1 tab twice a day    . nitroGLYCERIN (NITROSTAT) 0.4 MG SL tablet Place 0.4 mg under the tongue every 5 (five) minutes as needed  for chest pain.    Marland Kitchen ZETIA 10 MG tablet Take 10 mg by mouth daily. Take 1 tab daily     No current facility-administered medications for this visit.    Allergies:   Fish oil    Social History:  The patient  reports that he has quit smoking. He does not have any smokeless tobacco history on file.   Family History:  The patient's family history includes Heart attack in his mother.    ROS:  Please see the history of present illness.   Otherwise, review of systems are positive for none.   All other systems are reviewed and negative.    PHYSICAL EXAM: VS:  BP 162/60 mmHg  Pulse 54  Ht 5\' 8"  (1.727 m)  Wt 152 lb 1.9 oz (69.001 kg)  BMI 23.14 kg/m2 , BMI Body mass index is 23.14 kg/(m^2). GEN: Well nourished, well developed, in no acute distress HEENT: normal Neck: no JVD, carotid bruits, or masses Cardiac: RRR; no murmurs, rubs, or gallops,no edema  Respiratory:  clear to auscultation bilaterally, normal work of breathing GI: soft, nontender, nondistended, + BS MS: no deformity or atrophy Skin: warm and dry, no rash Neuro:  Strength and sensation are intact Psych: euthymic mood, full affect   EKG:  EKG is ordered today. The ekg ordered today demonstrates sinus bradycardia with PVC.   Recent Labs: No results found for requested labs within last 365 days.    Lipid Panel No results found for: CHOL, TRIG, HDL, CHOLHDL, VLDL, LDLCALC, LDLDIRECT    Wt Readings from Last 3 Encounters:  09/27/14 152 lb 1.9 oz (69.001 kg)  09/26/13 193 lb (87.544 kg)      Other studies Reviewed: Additional studies/ records that were reviewed today include:    ASSESSMENT AND PLAN:  Coronary artery disease involving coronary bypass graft of native heart without angina pectoris - Plan: EKG 12-Lead  Essential hypertension: Elevated systolic pressure. Possibly related to alcohol.  Hyperlipidemia: Not currently on an aggressive lipid regimen: Unable tolerate statins. He wants to come off  Zetia because of expense. No recent lipid values within this system. Last LDL was 84 in 2014. Lipid clinic for help with management.  Diabetes mellitus type II, controlled  Proteinuria     Current medicines are reviewed at length with the patient today.  The patient does not have concerns regarding medicines.  The following changes have been made:  no change  Labs/ tests ordered today include:  No orders of the defined types were placed in this encounter.     Disposition:   FU with HS in 1 year  Signed, Lesleigh Noe, MD  09/27/2014 9:05 AM    [facility] [address] [place], [place], [place]  [address] Phone: [phone]; Fax: [phone]

## 2014-09-27 NOTE — Patient Instructions (Addendum)
Medication Instructions:  Your physician recommends that you continue on your current medications as directed. Please refer to the Current Medication list given to you today.   Labwork: None   Testing/Procedures: None   Follow-Up: You have been referred to our Lipid Clinic  Your physician wants you to follow-up in: 1 year with Dr.Smith You will receive a reminder letter in the mail two months in advance. If you don't receive a letter, please call our office to schedule the follow-up appointment.    Any Other Special Instructions Will Be Listed Below (If Applicable).  Stay Active

## 2014-10-07 ENCOUNTER — Telehealth: Payer: Self-pay | Admitting: Pharmacist

## 2014-10-07 NOTE — Telephone Encounter (Signed)
Called patient to discuss Lipid Clinic appt for 5/10.  We did not have any labs since June 2015.  At that time his LDL was well controlled.  He was being sent by Dr. Katrinka Blazing because his Zetia was too expensive.  He has had intolerances to all of the statins he has tried in the past.  Given this information, would really prefer he stay on Zetia.  It will be generic sometime before the end of the year.  Will have him cut his tablet in half given the clinical data of LDL lowering between the 5mg  and the 10mg  dose were very similar and this will allow him to stretch his current Rx out for 6 months.  He is agreeable to this.  I do not see any reason to see the patient in the clinic so we have canceled his appointment.

## 2014-10-08 ENCOUNTER — Ambulatory Visit: Payer: Federal, State, Local not specified - PPO | Admitting: Pharmacist

## 2014-11-11 ENCOUNTER — Encounter: Payer: Self-pay | Admitting: Interventional Cardiology

## 2015-06-18 ENCOUNTER — Encounter: Payer: Self-pay | Admitting: Interventional Cardiology

## 2015-08-27 ENCOUNTER — Encounter (HOSPITAL_COMMUNITY): Payer: Self-pay | Admitting: Emergency Medicine

## 2015-08-27 ENCOUNTER — Inpatient Hospital Stay (HOSPITAL_COMMUNITY)
Admission: EM | Admit: 2015-08-27 | Discharge: 2015-08-31 | DRG: 287 | Disposition: A | Discharge status: Home / Self Care | Payer: Medicare Other | Attending: Internal Medicine | Admitting: Internal Medicine

## 2015-08-27 ENCOUNTER — Emergency Department (HOSPITAL_COMMUNITY): Payer: Medicare Other

## 2015-08-27 DIAGNOSIS — D6489 Other specified anemias: Secondary | ICD-10-CM | POA: Diagnosis present

## 2015-08-27 DIAGNOSIS — E669 Obesity, unspecified: Secondary | ICD-10-CM | POA: Diagnosis present

## 2015-08-27 DIAGNOSIS — E860 Dehydration: Secondary | ICD-10-CM | POA: Diagnosis present

## 2015-08-27 DIAGNOSIS — R079 Chest pain, unspecified: Secondary | ICD-10-CM

## 2015-08-27 DIAGNOSIS — E1151 Type 2 diabetes mellitus with diabetic peripheral angiopathy without gangrene: Secondary | ICD-10-CM | POA: Diagnosis present

## 2015-08-27 DIAGNOSIS — I10 Essential (primary) hypertension: Secondary | ICD-10-CM | POA: Diagnosis present

## 2015-08-27 DIAGNOSIS — I129 Hypertensive chronic kidney disease with stage 1 through stage 4 chronic kidney disease, or unspecified chronic kidney disease: Secondary | ICD-10-CM | POA: Diagnosis present

## 2015-08-27 DIAGNOSIS — N184 Chronic kidney disease, stage 4 (severe): Secondary | ICD-10-CM | POA: Diagnosis present

## 2015-08-27 DIAGNOSIS — I25710 Atherosclerosis of autologous vein coronary artery bypass graft(s) with unstable angina pectoris: Principal | ICD-10-CM | POA: Diagnosis present

## 2015-08-27 DIAGNOSIS — Z7984 Long term (current) use of oral hypoglycemic drugs: Secondary | ICD-10-CM

## 2015-08-27 DIAGNOSIS — I25709 Atherosclerosis of coronary artery bypass graft(s), unspecified, with unspecified angina pectoris: Secondary | ICD-10-CM | POA: Insufficient documentation

## 2015-08-27 DIAGNOSIS — Z87891 Personal history of nicotine dependence: Secondary | ICD-10-CM

## 2015-08-27 DIAGNOSIS — R001 Bradycardia, unspecified: Secondary | ICD-10-CM | POA: Diagnosis present

## 2015-08-27 DIAGNOSIS — I25110 Atherosclerotic heart disease of native coronary artery with unstable angina pectoris: Secondary | ICD-10-CM | POA: Diagnosis present

## 2015-08-27 DIAGNOSIS — D509 Iron deficiency anemia, unspecified: Secondary | ICD-10-CM | POA: Diagnosis present

## 2015-08-27 DIAGNOSIS — I252 Old myocardial infarction: Secondary | ICD-10-CM

## 2015-08-27 DIAGNOSIS — N400 Enlarged prostate without lower urinary tract symptoms: Secondary | ICD-10-CM | POA: Diagnosis present

## 2015-08-27 DIAGNOSIS — N183 Chronic kidney disease, stage 3 (moderate): Secondary | ICD-10-CM | POA: Diagnosis present

## 2015-08-27 DIAGNOSIS — Z951 Presence of aortocoronary bypass graft: Secondary | ICD-10-CM | POA: Diagnosis present

## 2015-08-27 DIAGNOSIS — E1122 Type 2 diabetes mellitus with diabetic chronic kidney disease: Secondary | ICD-10-CM | POA: Diagnosis present

## 2015-08-27 DIAGNOSIS — I249 Acute ischemic heart disease, unspecified: Secondary | ICD-10-CM | POA: Diagnosis present

## 2015-08-27 DIAGNOSIS — E785 Hyperlipidemia, unspecified: Secondary | ICD-10-CM | POA: Diagnosis present

## 2015-08-27 DIAGNOSIS — F1010 Alcohol abuse, uncomplicated: Secondary | ICD-10-CM | POA: Diagnosis present

## 2015-08-27 DIAGNOSIS — Z955 Presence of coronary angioplasty implant and graft: Secondary | ICD-10-CM

## 2015-08-27 DIAGNOSIS — Z7982 Long term (current) use of aspirin: Secondary | ICD-10-CM

## 2015-08-27 DIAGNOSIS — E871 Hypo-osmolality and hyponatremia: Secondary | ICD-10-CM | POA: Diagnosis present

## 2015-08-27 DIAGNOSIS — E1159 Type 2 diabetes mellitus with other circulatory complications: Secondary | ICD-10-CM | POA: Diagnosis present

## 2015-08-27 HISTORY — DX: Unspecified osteoarthritis, unspecified site: M19.90

## 2015-08-27 HISTORY — DX: Type 2 diabetes mellitus without complications: E11.9

## 2015-08-27 HISTORY — DX: Other specified postprocedural states: R11.2

## 2015-08-27 HISTORY — DX: Acute myocardial infarction, unspecified: I21.9

## 2015-08-27 HISTORY — DX: Other specified postprocedural states: Z98.890

## 2015-08-27 LAB — BASIC METABOLIC PANEL
ANION GAP: 11 (ref 5–15)
BUN: 12 mg/dL (ref 6–20)
CALCIUM: 8.2 mg/dL — AB (ref 8.9–10.3)
CO2: 21 mmol/L — ABNORMAL LOW (ref 22–32)
Chloride: 96 mmol/L — ABNORMAL LOW (ref 101–111)
Creatinine, Ser: 1.19 mg/dL (ref 0.61–1.24)
GFR calc Af Amer: >60 mL/min (ref >60)
GFR, EST NON AFRICAN AMERICAN: 58 mL/min — AB (ref >60)
GLUCOSE: 154 mg/dL — AB (ref 65–99)
POTASSIUM: 4.2 mmol/L (ref 3.5–5.1)
SODIUM: 128 mmol/L — AB (ref 135–145)

## 2015-08-27 LAB — CBC
HCT: 31.1 % — ABNORMAL LOW (ref 39.0–52.0)
HEMOGLOBIN: 10.5 g/dL — AB (ref 13.0–17.0)
MCH: 30.3 pg (ref 26.0–34.0)
MCHC: 33.8 g/dL (ref 30.0–36.0)
MCV: 89.9 fL (ref 78.0–100.0)
Platelets: 190 10*3/uL (ref 150–400)
RBC: 3.46 MIL/uL — ABNORMAL LOW (ref 4.22–5.81)
RDW: 12.9 % (ref 11.5–15.5)
WBC: 11.1 10*3/uL — AB (ref 4.0–10.5)

## 2015-08-27 LAB — I-STAT TROPONIN, ED: TROPONIN I, POC: 0.03 ng/mL (ref 0.00–0.08)

## 2015-08-27 MED ORDER — GI COCKTAIL ~~LOC~~
30.0000 mL | Freq: Once | ORAL | Status: AC
  Administered 2015-08-27: 30 mL via ORAL
  Filled 2015-08-27: qty 30

## 2015-08-27 MED ORDER — SODIUM CHLORIDE 0.9 % IV BOLUS (SEPSIS)
500.0000 mL | Freq: Once | INTRAVENOUS | Status: AC
  Administered 2015-08-27: 500 mL via INTRAVENOUS

## 2015-08-27 MED ORDER — ASPIRIN 81 MG PO CHEW
324.0000 mg | CHEWABLE_TABLET | Freq: Once | ORAL | Status: AC
  Administered 2015-08-27: 324 mg via ORAL
  Filled 2015-08-27: qty 4

## 2015-08-27 MED ORDER — METOPROLOL TARTRATE 25 MG PO TABS
100.0000 mg | ORAL_TABLET | Freq: Once | ORAL | Status: AC
  Administered 2015-08-27: 100 mg via ORAL
  Filled 2015-08-27: qty 4

## 2015-08-27 NOTE — ED Notes (Signed)
EDP ( Dr. Alric Ran ) , Charge nurse Elliot Gurney ) , and nurse first Evlyn Kanner RN ) notified on pt.'s elevated blood pressure , no order received from EDP .

## 2015-08-27 NOTE — ED Provider Notes (Signed)
CSN: [id number]     Arrival date & time 08/27/15  1918 History  By signing my name below, I, Rohini Rajnarayanan, attest that this documentation has been prepared under the direction and in the presence of Tilden Fossa, MD Electronically Signed: Charlean Merl, ED Scribe 08/27/2015 at 11:24 PM.   Chief Complaint  Patient presents with  . Chest Pain   The history is provided by the patient. No language interpreter was used.    HPI Comments: [name] is a 75 y.o. male with a pmhx of CAD, HTN, HLD, type 2 diabetes, peripheral vascular disease, MI -37- , and CABG, who presents to the Emergency Department complaining of sudden onset, constant, central CP with associated SOB, occasional dry cough, and emesis x2 which began around 4:30PM today. Pt states that CP "hurts all the way through his back." Pain is ameliorated by laying down and avoiding movement. He states no exacerbating factors. Pt took 1 NTG sublingually, PTA, but had no relief. Pt did not take any aspirin today, and states that he usually takes it at night. His last dose of aspirin was taken normally yesterday night. Pt states that his pain is currently rated 3/10, and is constantly decreasing after arrival to the ED. Pt denies any fever, cough, or leg swelling. Pt denies any recent medication changes. He will usually drink 5 lite beers a day. Pt has had 2 surgeries for hernias in the past. Pt's cardiologist is Dr. Verdis Prime.  Past Medical History  Diagnosis Date  . CAD (coronary artery disease)   . HTN (hypertension)   . Hyperlipemia   . Diabetes mellitus type 2 with peripheral artery disease (HCC)   . Peripheral vascular disease (HCC)   . Benign prostatic hyperplasia   . Iron deficiency anemia   . Alcohol abuse   . Obesity    Past Surgical History  Procedure Laterality Date  . Carotid endarterectomy    . Hernia repair     Family History  Problem Relation Age of Onset  . Heart attack Mother   . CAD Father     Social History  Substance Use Topics  . Smoking status: Former Games developer  . Smokeless tobacco: None  . Alcohol Use: Yes     Comment: 4-5 beers everyday.    Review of Systems  Constitutional: Negative for fever.  Respiratory: Positive for shortness of breath. Negative for cough.   Cardiovascular: Positive for chest pain. Negative for leg swelling.  Gastrointestinal: Positive for vomiting (x2).  All other systems reviewed and are negative.  10 Systems reviewed and all are negative for acute change except as noted in the HPI.  Allergies  Other; Statins; and Fish oil  Home Medications   Prior to Admission medications   Medication Sig Start Date End Date Taking? Authorizing Provider  aspirin 81 MG tablet Take 81 mg by mouth daily.   Yes Historical Provider, MD  Coenzyme Q10 (COQ10) 100 MG CAPS Take 100 mg by mouth daily.   Yes Historical Provider, MD  ferrous sulfate 325 (65 FE) MG tablet Take 325 mg by mouth daily with breakfast.   Yes Historical Provider, MD  furosemide (LASIX) 40 MG tablet Take 40 mg by mouth every morning.    Yes Historical Provider, MD  losartan (COZAAR) 50 MG tablet Take 50 mg by mouth every evening.    Yes Historical Provider, MD  metFORMIN (GLUCOPHAGE) 1000 MG tablet Take 1,000 mg by mouth 2 (two) times daily with a meal.  Yes Historical Provider, MD  metoprolol (LOPRESSOR) 100 MG tablet Take 100 mg by mouth 2 (two) times daily.   Yes Historical Provider, MD  Niacin CR 1000 MG TBCR Take 1 tablet by mouth 2 (two) times daily. Take 1 tab twice a day   Yes Historical Provider, MD  nitroGLYCERIN (NITROSTAT) 0.4 MG SL tablet Place 0.4 mg under the tongue every 5 (five) minutes as needed for chest pain.   Yes Historical Provider, MD  ZETIA 10 MG tablet Take 5 mg by mouth daily. TAKES 1/2 TAB 07/30/13  Yes Historical Provider, MD   BP 165/48 mmHg  Pulse 60  Temp(Src) 98.8 F (37.1 C) (Oral)  Resp 18  Ht  (1.727 m)  Wt 182 lb 8 oz (82.781 kg)  BMI 27.76  kg/m2  SpO2 100% Physical Exam  Constitutional: He is oriented to person, place, and time. He appears well-developed and well-nourished.  HENT:  Head: Normocephalic and atraumatic.  Cardiovascular: Normal rate and regular rhythm.   No murmur heard. Pulmonary/Chest: Effort normal and breath sounds normal. No respiratory distress.  Abdominal: Soft. There is no tenderness. There is no rebound and no guarding.  Musculoskeletal: He exhibits no edema or tenderness.  Neurological: He is alert and oriented to person, place, and time.  Skin: Skin is warm and dry.  Psychiatric: He has a normal mood and affect. His behavior is normal.  Nursing note and vitals reviewed.   ED Course  Procedures  DIAGNOSTIC STUDIES: Oxygen Saturation is 100% on RA, normal by my interpretation.    COORDINATION OF CARE: 11:15 PM-Discussed treatment plan which includes cardiac monitoring, blood work, DG Chest, EKG, and troponin, with pt at bedside and pt agreed to plan.   Labs Review Labs Reviewed  BASIC METABOLIC PANEL - Abnormal; Notable for the following:    Sodium 128 (*)    Chloride 96 (*)    CO2 21 (*)    Glucose, Bld 154 (*)    Calcium 8.2 (*)    GFR calc non Af Amer 58 (*)    All other components within normal limits  CBC - Abnormal; Notable for the following:    WBC 11.1 (*)    RBC 3.46 (*)    Hemoglobin 10.5 (*)    HCT 31.1 (*)    All other components within normal limits  HEPATIC FUNCTION PANEL - Abnormal; Notable for the following:    Total Protein 6.4 (*)    Albumin 2.6 (*)    AST 44 (*)    All other components within normal limits  TROPONIN I - Abnormal; Notable for the following:    Troponin I 0.04 (*)    All other components within normal limits  TROPONIN I - Abnormal; Notable for the following:    Troponin I 0.05 (*)    All other components within normal limits  CBC - Abnormal; Notable for the following:    RBC 3.22 (*)    Hemoglobin 10.2 (*)    HCT 28.9 (*)    All other  components within normal limits  BASIC METABOLIC PANEL - Abnormal; Notable for the following:    Sodium 131 (*)    Chloride 99 (*)    Glucose, Bld 119 (*)    Calcium 8.3 (*)    All other components within normal limits  HEPATIC FUNCTION PANEL - Abnormal; Notable for the following:    Total Protein 5.8 (*)    Albumin 2.4 (*)    All other components within  normal limits  LIPASE, BLOOD  LIPASE, BLOOD  TROPONIN I  TROPONIN I  SODIUM, URINE, RANDOM  I-STAT TROPOININ, ED    Imaging Review Dg Chest 2 View  08/27/2015  CLINICAL DATA:  75 year old male with chest pain EXAM: CHEST  2 VIEW COMPARISON:  Chest radiograph dated 03/23/2013 FINDINGS: Two views of the chest demonstrate mild emphysematous changes of the lungs. There is no focal consolidation, pleural effusion, or pneumothorax. 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 Stable cardiac silhouette. Median sternotomy wires and CABG vascular clips noted. There is degenerative changes of the shoulders and spine. No acute fracture. IMPRESSION: No active cardiopulmonary disease. Electronically Signed   By: Elgie Collard M.D.   On: 08/27/2015 19:59   I have personally reviewed and evaluated these images and lab results as part of my medical decision-making.   EKG Interpretation   Date/Time:  Wednesday August 27 2015 19:29:20 EDT Ventricular Rate:  59 PR Interval:  156 QRS Duration: 94 QT Interval:  446 QTC Calculation: 441 R Axis:   75 Text Interpretation:  Sinus bradycardia with Premature supraventricular  complexes Otherwise normal ECG Confirmed by Lincoln Brigham [phone]) on 08/27/2015  11:02:29 PM      MDM   Final diagnoses:  Chest pain, unspecified chest pain type   Patient here for evaluation of chest pain, has a history of  coronary artery disease. His pain is resolved on evaluation in the emergency department. Medicine consultation for observation for his chest pain. Presentation is not consistent with PE or dissection.  I personally performed the services described in this documentation, which was scribed in my presence. The recorded information has been reviewed and is accurate.     Tilden Fossa, MD 08/28/15 [phone]

## 2015-08-27 NOTE — ED Notes (Signed)
Pt. reports central chest pain with mild SOB , occasional dry cough and emesis onset this afternoon . Pt. took 1 NTG sl prior to arrival with no relief, history of CAD / CABG his cardiologist is Dr. Verdis Prime.

## 2015-08-28 ENCOUNTER — Observation Stay (HOSPITAL_BASED_OUTPATIENT_CLINIC_OR_DEPARTMENT_OTHER): Payer: Medicare Other

## 2015-08-28 ENCOUNTER — Encounter (HOSPITAL_COMMUNITY): Payer: Self-pay | Admitting: Internal Medicine

## 2015-08-28 DIAGNOSIS — E1159 Type 2 diabetes mellitus with other circulatory complications: Secondary | ICD-10-CM | POA: Diagnosis present

## 2015-08-28 DIAGNOSIS — R001 Bradycardia, unspecified: Secondary | ICD-10-CM | POA: Diagnosis present

## 2015-08-28 DIAGNOSIS — E785 Hyperlipidemia, unspecified: Secondary | ICD-10-CM | POA: Diagnosis present

## 2015-08-28 DIAGNOSIS — I252 Old myocardial infarction: Secondary | ICD-10-CM | POA: Diagnosis not present

## 2015-08-28 DIAGNOSIS — I249 Acute ischemic heart disease, unspecified: Secondary | ICD-10-CM | POA: Diagnosis present

## 2015-08-28 DIAGNOSIS — R071 Chest pain on breathing: Secondary | ICD-10-CM | POA: Diagnosis not present

## 2015-08-28 DIAGNOSIS — D6489 Other specified anemias: Secondary | ICD-10-CM | POA: Diagnosis present

## 2015-08-28 DIAGNOSIS — I25710 Atherosclerosis of autologous vein coronary artery bypass graft(s) with unstable angina pectoris: Secondary | ICD-10-CM | POA: Diagnosis present

## 2015-08-28 DIAGNOSIS — R079 Chest pain, unspecified: Secondary | ICD-10-CM

## 2015-08-28 DIAGNOSIS — D509 Iron deficiency anemia, unspecified: Secondary | ICD-10-CM | POA: Diagnosis present

## 2015-08-28 DIAGNOSIS — I25700 Atherosclerosis of coronary artery bypass graft(s), unspecified, with unstable angina pectoris: Secondary | ICD-10-CM

## 2015-08-28 DIAGNOSIS — Z87891 Personal history of nicotine dependence: Secondary | ICD-10-CM | POA: Diagnosis not present

## 2015-08-28 DIAGNOSIS — E860 Dehydration: Secondary | ICD-10-CM | POA: Diagnosis present

## 2015-08-28 DIAGNOSIS — N183 Chronic kidney disease, stage 3 (moderate): Secondary | ICD-10-CM | POA: Diagnosis not present

## 2015-08-28 DIAGNOSIS — Z7984 Long term (current) use of oral hypoglycemic drugs: Secondary | ICD-10-CM | POA: Diagnosis not present

## 2015-08-28 DIAGNOSIS — Z7982 Long term (current) use of aspirin: Secondary | ICD-10-CM | POA: Diagnosis not present

## 2015-08-28 DIAGNOSIS — E871 Hypo-osmolality and hyponatremia: Secondary | ICD-10-CM | POA: Diagnosis present

## 2015-08-28 DIAGNOSIS — I10 Essential (primary) hypertension: Secondary | ICD-10-CM

## 2015-08-28 DIAGNOSIS — N400 Enlarged prostate without lower urinary tract symptoms: Secondary | ICD-10-CM | POA: Diagnosis present

## 2015-08-28 DIAGNOSIS — E1151 Type 2 diabetes mellitus with diabetic peripheral angiopathy without gangrene: Secondary | ICD-10-CM | POA: Diagnosis present

## 2015-08-28 DIAGNOSIS — E1122 Type 2 diabetes mellitus with diabetic chronic kidney disease: Secondary | ICD-10-CM | POA: Diagnosis present

## 2015-08-28 DIAGNOSIS — F1010 Alcohol abuse, uncomplicated: Secondary | ICD-10-CM | POA: Diagnosis present

## 2015-08-28 DIAGNOSIS — R072 Precordial pain: Secondary | ICD-10-CM | POA: Diagnosis not present

## 2015-08-28 DIAGNOSIS — Z955 Presence of coronary angioplasty implant and graft: Secondary | ICD-10-CM | POA: Diagnosis not present

## 2015-08-28 DIAGNOSIS — E669 Obesity, unspecified: Secondary | ICD-10-CM | POA: Diagnosis present

## 2015-08-28 DIAGNOSIS — I25110 Atherosclerotic heart disease of native coronary artery with unstable angina pectoris: Secondary | ICD-10-CM | POA: Diagnosis not present

## 2015-08-28 DIAGNOSIS — I129 Hypertensive chronic kidney disease with stage 1 through stage 4 chronic kidney disease, or unspecified chronic kidney disease: Secondary | ICD-10-CM | POA: Diagnosis present

## 2015-08-28 LAB — SODIUM, URINE, RANDOM: Sodium, Ur: 51 mmol/L

## 2015-08-28 LAB — BASIC METABOLIC PANEL
Anion gap: 7 (ref 5–15)
BUN: 10 mg/dL (ref 6–20)
CHLORIDE: 99 mmol/L — AB (ref 101–111)
CO2: 25 mmol/L (ref 22–32)
Calcium: 8.3 mg/dL — ABNORMAL LOW (ref 8.9–10.3)
Creatinine, Ser: 1.05 mg/dL (ref 0.61–1.24)
GFR calc non Af Amer: >60 mL/min (ref >60)
Glucose, Bld: 119 mg/dL — ABNORMAL HIGH (ref 65–99)
POTASSIUM: 4 mmol/L (ref 3.5–5.1)
SODIUM: 131 mmol/L — AB (ref 135–145)

## 2015-08-28 LAB — LIPASE, BLOOD
LIPASE: 36 U/L (ref 11–51)
Lipase: 33 U/L (ref 11–51)

## 2015-08-28 LAB — HEPARIN LEVEL (UNFRACTIONATED): HEPARIN UNFRACTIONATED: 0.39 [IU]/mL (ref 0.30–0.70)

## 2015-08-28 LAB — HEPATIC FUNCTION PANEL
ALBUMIN: 2.4 g/dL — AB (ref 3.5–5.0)
ALK PHOS: 108 U/L (ref 38–126)
ALK PHOS: 109 U/L (ref 38–126)
ALT: 23 U/L (ref 17–63)
ALT: 25 U/L (ref 17–63)
AST: 40 U/L (ref 15–41)
AST: 44 U/L — ABNORMAL HIGH (ref 15–41)
Albumin: 2.6 g/dL — ABNORMAL LOW (ref 3.5–5.0)
BILIRUBIN DIRECT: 0.1 mg/dL (ref 0.1–0.5)
BILIRUBIN INDIRECT: 0.7 mg/dL (ref 0.3–0.9)
Bilirubin, Direct: 0.1 mg/dL (ref 0.1–0.5)
Indirect Bilirubin: 0.5 mg/dL (ref 0.3–0.9)
TOTAL PROTEIN: 5.8 g/dL — AB (ref 6.5–8.1)
Total Bilirubin: 0.6 mg/dL (ref 0.3–1.2)
Total Bilirubin: 0.8 mg/dL (ref 0.3–1.2)
Total Protein: 6.4 g/dL — ABNORMAL LOW (ref 6.5–8.1)

## 2015-08-28 LAB — GLUCOSE, CAPILLARY
GLUCOSE-CAPILLARY: 178 mg/dL — AB (ref 65–99)
GLUCOSE-CAPILLARY: 170 mg/dL — AB (ref 65–99)

## 2015-08-28 LAB — CBC
HCT: 28.9 % — ABNORMAL LOW (ref 39.0–52.0)
HEMOGLOBIN: 10.2 g/dL — AB (ref 13.0–17.0)
MCH: 31.7 pg (ref 26.0–34.0)
MCHC: 35.3 g/dL (ref 30.0–36.0)
MCV: 89.8 fL (ref 78.0–100.0)
Platelets: 166 10*3/uL (ref 150–400)
RBC: 3.22 MIL/uL — AB (ref 4.22–5.81)
RDW: 13 % (ref 11.5–15.5)
WBC: 10.1 10*3/uL (ref 4.0–10.5)

## 2015-08-28 LAB — PROTIME-INR
INR: 1.14 (ref 0.00–1.49)
Prothrombin Time: 14.8 seconds (ref 11.6–15.2)

## 2015-08-28 LAB — TROPONIN I
TROPONIN I: 0.05 ng/mL — AB (ref <0.031)
Troponin I: 0.04 ng/mL — ABNORMAL HIGH (ref <0.031)
Troponin I: 0.04 ng/mL — ABNORMAL HIGH (ref <0.031)
Troponin I: 0.06 ng/mL — ABNORMAL HIGH (ref <0.031)

## 2015-08-28 LAB — ECHOCARDIOGRAM COMPLETE
HEIGHTINCHES: 68 in
Weight: 2920 oz

## 2015-08-28 LAB — CBG MONITORING, ED
GLUCOSE-CAPILLARY: 93 mg/dL (ref 65–99)
Glucose-Capillary: 112 mg/dL — ABNORMAL HIGH (ref 65–99)

## 2015-08-28 MED ORDER — LORAZEPAM 2 MG/ML IJ SOLN: 1.0000 mg | Freq: Four times a day (QID) | INTRAMUSCULAR | Status: AC | PRN

## 2015-08-28 MED ORDER — NIACIN ER 500 MG PO TBCR
1000.0000 mg | EXTENDED_RELEASE_TABLET | Freq: Two times a day (BID) | ORAL | Status: DC
  Administered 2015-08-29 – 2015-08-31 (×5): 1000 mg via ORAL
  Filled 2015-08-28 (×9): qty 2

## 2015-08-28 MED ORDER — SODIUM CHLORIDE 0.9 % IV SOLN: 250.0000 mL | INTRAVENOUS | Status: DC | PRN

## 2015-08-28 MED ORDER — ACETAMINOPHEN 325 MG PO TABS: 650.0000 mg | ORAL_TABLET | ORAL | Status: DC | PRN

## 2015-08-28 MED ORDER — SODIUM CHLORIDE 0.9 % WEIGHT BASED INFUSION
1.0000 mL/kg/h | INTRAVENOUS | Status: DC
  Administered 2015-08-29: 1 mL/kg/h via INTRAVENOUS

## 2015-08-28 MED ORDER — NITROGLYCERIN 0.4 MG SL SUBL
0.4000 mg | SUBLINGUAL_TABLET | SUBLINGUAL | Status: DC | PRN
  Administered 2015-08-28 (×2): 0.4 mg via SUBLINGUAL
  Filled 2015-08-28: qty 1

## 2015-08-28 MED ORDER — FOLIC ACID 1 MG PO TABS
1.0000 mg | ORAL_TABLET | Freq: Every day | ORAL | Status: DC
  Administered 2015-08-28 – 2015-08-31 (×4): 1 mg via ORAL
  Filled 2015-08-28 (×4): qty 1

## 2015-08-28 MED ORDER — FERROUS SULFATE 325 (65 FE) MG PO TABS
325.0000 mg | ORAL_TABLET | Freq: Every day | ORAL | Status: DC
  Administered 2015-08-30 – 2015-08-31 (×2): 325 mg via ORAL
  Filled 2015-08-28 (×3): qty 1

## 2015-08-28 MED ORDER — HEPARIN (PORCINE) IN NACL 100-0.45 UNIT/ML-% IJ SOLN
1000.0000 [IU]/h | INTRAMUSCULAR | Status: DC
  Administered 2015-08-28: 1000 [IU]/h via INTRAVENOUS
  Filled 2015-08-28 (×2): qty 250

## 2015-08-28 MED ORDER — MORPHINE SULFATE (PF) 2 MG/ML IV SOLN
2.0000 mg | INTRAVENOUS | Status: DC | PRN
  Administered 2015-08-29: 2 mg via INTRAVENOUS
  Filled 2015-08-28: qty 1

## 2015-08-28 MED ORDER — INSULIN ASPART 100 UNIT/ML ~~LOC~~ SOLN
0.0000 [IU] | Freq: Three times a day (TID) | SUBCUTANEOUS | Status: DC
  Administered 2015-08-28: 2 [IU] via SUBCUTANEOUS
  Administered 2015-08-30: 1 [IU] via SUBCUTANEOUS

## 2015-08-28 MED ORDER — SODIUM CHLORIDE 0.9% FLUSH: 3.0000 mL | Freq: Two times a day (BID) | INTRAVENOUS | Status: DC

## 2015-08-28 MED ORDER — ASPIRIN 81 MG PO CHEW
81.0000 mg | CHEWABLE_TABLET | ORAL | Status: AC
  Administered 2015-08-29: 81 mg via ORAL
  Filled 2015-08-28: qty 1

## 2015-08-28 MED ORDER — LORAZEPAM 1 MG PO TABS
0.0000 mg | ORAL_TABLET | Freq: Two times a day (BID) | ORAL | Status: DC
Start: 2015-08-30 — End: 2015-08-31

## 2015-08-28 MED ORDER — LOSARTAN POTASSIUM 50 MG PO TABS
50.0000 mg | ORAL_TABLET | Freq: Every evening | ORAL | Status: DC
  Administered 2015-08-28 – 2015-08-29 (×2): 50 mg via ORAL
  Filled 2015-08-28 (×3): qty 1

## 2015-08-28 MED ORDER — ONDANSETRON HCL 4 MG/2ML IJ SOLN: 4.0000 mg | Freq: Four times a day (QID) | INTRAMUSCULAR | Status: DC | PRN

## 2015-08-28 MED ORDER — EZETIMIBE 10 MG PO TABS
5.0000 mg | ORAL_TABLET | Freq: Every day | ORAL | Status: DC
  Administered 2015-08-28 – 2015-08-31 (×4): 5 mg via ORAL
  Filled 2015-08-28: qty 1
  Filled 2015-08-28: qty 0.5
  Filled 2015-08-28 (×2): qty 1

## 2015-08-28 MED ORDER — SODIUM CHLORIDE 0.9 % IV SOLN
INTRAVENOUS | Status: AC
  Administered 2015-08-28 (×2): via INTRAVENOUS

## 2015-08-28 MED ORDER — NITROGLYCERIN IN D5W 200-5 MCG/ML-% IV SOLN
20.0000 ug/min | INTRAVENOUS | Status: DC
  Administered 2015-08-28: 20 ug/min via INTRAVENOUS
  Filled 2015-08-28: qty 250

## 2015-08-28 MED ORDER — VITAMIN B-1 100 MG PO TABS
100.0000 mg | ORAL_TABLET | Freq: Every day | ORAL | Status: DC
  Administered 2015-08-28 – 2015-08-30 (×3): 100 mg via ORAL
  Filled 2015-08-28 (×3): qty 1

## 2015-08-28 MED ORDER — METOPROLOL TARTRATE 100 MG PO TABS
100.0000 mg | ORAL_TABLET | Freq: Two times a day (BID) | ORAL | Status: DC
  Administered 2015-08-28 – 2015-08-31 (×7): 100 mg via ORAL
  Filled 2015-08-28: qty 4
  Filled 2015-08-28 (×6): qty 1

## 2015-08-28 MED ORDER — ASPIRIN EC 325 MG PO TBEC
325.0000 mg | DELAYED_RELEASE_TABLET | Freq: Every day | ORAL | Status: DC
  Administered 2015-08-28 – 2015-08-31 (×4): 325 mg via ORAL
  Filled 2015-08-28 (×4): qty 1

## 2015-08-28 MED ORDER — HEPARIN BOLUS VIA INFUSION
2000.0000 [IU] | Freq: Once | INTRAVENOUS | Status: AC
Start: 2015-08-28 — End: 2015-08-28
  Administered 2015-08-28: 2000 [IU] via INTRAVENOUS
  Filled 2015-08-28: qty 2000

## 2015-08-28 MED ORDER — SODIUM CHLORIDE 0.9% FLUSH: 3.0000 mL | INTRAVENOUS | Status: DC | PRN

## 2015-08-28 MED ORDER — ADULT MULTIVITAMIN W/MINERALS CH
1.0000 | ORAL_TABLET | Freq: Every day | ORAL | Status: DC
  Administered 2015-08-28 – 2015-08-30 (×3): 1 via ORAL
  Filled 2015-08-28 (×3): qty 1

## 2015-08-28 MED ORDER — LORAZEPAM 1 MG PO TABS: 0.0000 mg | ORAL_TABLET | Freq: Four times a day (QID) | ORAL | Status: AC

## 2015-08-28 MED ORDER — ENOXAPARIN SODIUM 40 MG/0.4ML ~~LOC~~ SOLN
40.0000 mg | SUBCUTANEOUS | Status: DC
  Administered 2015-08-28: 40 mg via SUBCUTANEOUS
  Filled 2015-08-28: qty 0.4

## 2015-08-28 MED ORDER — LORAZEPAM 1 MG PO TABS: 1.0000 mg | ORAL_TABLET | Freq: Four times a day (QID) | ORAL | Status: AC | PRN

## 2015-08-28 MED ORDER — THIAMINE HCL 100 MG/ML IJ SOLN: 100.0000 mg | Freq: Every day | INTRAMUSCULAR | Status: DC

## 2015-08-28 NOTE — ED Notes (Signed)
Called and informed provider of increase in Trop. Level, he reported that he would be in to see the pt.

## 2015-08-28 NOTE — Progress Notes (Signed)
ANTICOAGULATION CONSULT NOTE - Follow Up Consult  Pharmacy Consult for Heparin  Indication: chest pain/ACS   Patient Measurements: Height: 5\' 8"  (172.7 cm) Weight: 177 lb 11.1 oz (80.6 kg) (scale A) IBW/kg (Calculated) : 68.4  Vital Signs: BP: 189/71 mmHg (03/30 1759) Pulse Rate: 57 (03/30 1651)  Labs:  Recent Labs  08/27/15 1936  08/28/15 0445 08/28/15 1022 08/28/15 1557 08/28/15 1830 08/28/15 2147  HGB 10.5*  --  10.2*  --   --   --   --   HCT 31.1*  --  28.9*  --   --   --   --   PLT 190  --  166  --   --   --   --   LABPROT  --   --   --   --   --  14.8  --   INR  --   --   --   --   --  1.14  --   HEPARINUNFRC  --   --   --   --   --   --  0.39  CREATININE 1.19  --  1.05  --   --   --   --   TROPONINI  --   < > 0.05* 0.06* 0.04*  --   --   < > = values in this interval not displayed.  Estimated Creatinine Clearance: 58.8 mL/min (by C-G formula based on Cr of 1.05).   Assessment: Started on heparin, likely cath in AM, initial heparin level is therapeutic  Goal of Therapy:  Heparin level 0.3-0.7 units/ml Monitor platelets by anticoagulation protocol: Yes   Plan:  -Cont heparin 1000 units/hr -Confirmatory HL with AM labs  Abran Duke 08/28/2015,11:25 PM

## 2015-08-28 NOTE — ED Notes (Signed)
PT returned to room

## 2015-08-28 NOTE — Consult Note (Addendum)
Cardiologist: Katrinka Blazing Reason for Consult:  Chest Pain Referring Physician: Saiquan Hands is an 75 y.o. male.  HPI:   Patient is a 75 year old male with history of coronary artery disease, hypertension, hyperlipidemia, diabetes mellitus type 2, peripheral vascular disease, obesity.  He had coronary artery bypass grafting x3 in 2005 by Dr. Morton Peters.    He presents with chest pain(epigastric) which started yesterday after getting off his tractor.  He was giving his grandchild a ride.  He went into the house and sat down.  After about 15 mins he vomited.  Pain was 7/10 and radiated through to his back.  He ate about 2.5 hours before onset.  One SL NTG did nothing.  He received a GI cocktail at 2345hrs.  Current pain level 1/10.  The patient denies fever, shortness of breath, diaphoresis, orthopnea, dizziness, PND, cough, congestion, abdominal pain, hematochezia, melena,  claudication.  He had some issues with LEE about 2 months ago and held amlodipine for a while and it improved.     Past Medical History  Diagnosis Date  . CAD (coronary artery disease)   . HTN (hypertension)   . Hyperlipemia   . Diabetes mellitus type 2 with peripheral artery disease (HCC)   . Peripheral vascular disease (HCC)   . Benign prostatic hyperplasia   . Iron deficiency anemia   . Alcohol abuse   . Obesity     Past Surgical History  Procedure Laterality Date  . Carotid endarterectomy    . Hernia repair      Family History  Problem Relation Age of Onset  . Heart attack Mother   . CAD Father     Social History:  reports that he has quit smoking. He does not have any smokeless tobacco history on file. He reports that he drinks alcohol. He reports that he does not use illicit drugs.  Allergies:  Allergies  Allergen Reactions  . Other Anaphylaxis    ALL BELL PEPPERS  . Statins Other (See Comments)    MYLAGIAS AND LEG NERVE DAMAGE    . Fish Oil Hives    Medications: Scheduled Meds: .  aspirin EC  325 mg Oral Daily  . enoxaparin (LOVENOX) injection  40 mg Subcutaneous Q24H  . ezetimibe  5 mg Oral Daily  . ferrous sulfate  325 mg Oral Q breakfast  . folic acid  1 mg Oral Daily  . insulin aspart  0-9 Units Subcutaneous TID WC  . LORazepam  0-4 mg Oral Q6H   Followed by  . [START ON 08/30/2015] LORazepam  0-4 mg Oral Q12H  . losartan  50 mg Oral QPM  . metoprolol  100 mg Oral BID  . multivitamin with minerals  1 tablet Oral Daily  . Niacin CR  1 tablet Oral BID  . thiamine  100 mg Oral Daily   Or  . thiamine  100 mg Intravenous Daily   Continuous Infusions: . sodium chloride 75 mL/hr at 08/28/15 0452   PRN Meds:.acetaminophen, LORazepam **OR** LORazepam, morphine injection, nitroGLYCERIN, ondansetron (ZOFRAN) IV   Results for orders placed or performed during the hospital encounter of 08/27/15 (from the past 48 hour(s))  Basic metabolic panel     Status: Abnormal   Collection Time: 08/27/15  7:36 PM  Result Value Ref Range   Sodium 128 (L) 135 - 145 mmol/L   Potassium 4.2 3.5 - 5.1 mmol/L   Chloride 96 (L) 101 - 111 mmol/L   CO2 21 (L)  22 - 32 mmol/L   Glucose, Bld 154 (H) 65 - 99 mg/dL   BUN 12 6 - 20 mg/dL   Creatinine, Ser 1.19 0.61 - 1.24 mg/dL   Calcium 8.2 (L) 8.9 - 10.3 mg/dL   GFR calc non Af Amer 58 (L) >60 mL/min   GFR calc Af Amer >60 >60 mL/min    Comment: (NOTE) The eGFR has been calculated using the CKD EPI equation. This calculation has not been validated in all clinical situations. eGFR's persistently <60 mL/min signify possible Chronic Kidney Disease.    Anion gap 11 5 - 15  CBC     Status: Abnormal   Collection Time: 08/27/15  7:36 PM  Result Value Ref Range   WBC 11.1 (H) 4.0 - 10.5 K/uL   RBC 3.46 (L) 4.22 - 5.81 MIL/uL   Hemoglobin 10.5 (L) 13.0 - 17.0 g/dL   HCT 31.1 (L) 39.0 - 52.0 %   MCV 89.9 78.0 - 100.0 fL   MCH 30.3 26.0 - 34.0 pg   MCHC 33.8 30.0 - 36.0 g/dL   RDW 12.9 11.5 - 15.5 %   Platelets 190 150 - 400 K/uL    I-stat troponin, ED (not at [facility], [facility])     Status: None   Collection Time: 08/27/15  7:42 PM  Result Value Ref Range   Troponin i, poc 0.03 0.00 - 0.08 ng/mL   Comment 3            Comment: Due to the release kinetics of cTnI, a negative result within the first hours of the onset of symptoms does not rule out myocardial infarction with certainty. If myocardial infarction is still suspected, repeat the test at appropriate intervals.   Hepatic function panel     Status: Abnormal   Collection Time: 08/27/15 11:36 PM  Result Value Ref Range   Total Protein 6.4 (L) 6.5 - 8.1 g/dL   Albumin 2.6 (L) 3.5 - 5.0 g/dL   AST 44 (H) 15 - 41 U/L   ALT 25 17 - 63 U/L   Alkaline Phosphatase 109 38 - 126 U/L   Total Bilirubin 0.8 0.3 - 1.2 mg/dL   Bilirubin, Direct 0.1 0.1 - 0.5 mg/dL   Indirect Bilirubin 0.7 0.3 - 0.9 mg/dL  Lipase, blood     Status: None   Collection Time: 08/27/15 11:36 PM  Result Value Ref Range   Lipase 36 11 - 51 U/L  Troponin I     Status: Abnormal   Collection Time: 08/27/15 11:36 PM  Result Value Ref Range   Troponin I 0.04 (H) <0.031 ng/mL    Comment:        PERSISTENTLY INCREASED TROPONIN VALUES IN THE RANGE OF 0.04-0.49 ng/mL CAN BE SEEN IN:       -UNSTABLE ANGINA       -CONGESTIVE HEART FAILURE       -MYOCARDITIS       -CHEST TRAUMA       -ARRYHTHMIAS       -LATE PRESENTING MYOCARDIAL INFARCTION       -COPD   CLINICAL FOLLOW-UP RECOMMENDED.   Troponin I (q 6hr x 3)     Status: Abnormal   Collection Time: 08/28/15  4:45 AM  Result Value Ref Range   Troponin I 0.05 (H) <0.031 ng/mL    Comment:        PERSISTENTLY INCREASED TROPONIN VALUES IN THE RANGE OF 0.04-0.49 ng/mL CAN BE SEEN IN:       -UNSTABLE  ANGINA       -CONGESTIVE HEART FAILURE       -MYOCARDITIS       -CHEST TRAUMA       -ARRYHTHMIAS       -LATE PRESENTING MYOCARDIAL INFARCTION       -COPD   CLINICAL FOLLOW-UP RECOMMENDED.   CBC     Status: Abnormal   Collection Time: 08/28/15   4:45 AM  Result Value Ref Range   WBC 10.1 4.0 - 10.5 K/uL   RBC 3.22 (L) 4.22 - 5.81 MIL/uL   Hemoglobin 10.2 (L) 13.0 - 17.0 g/dL   HCT 28.9 (L) 39.0 - 52.0 %   MCV 89.8 78.0 - 100.0 fL   MCH 31.7 26.0 - 34.0 pg   MCHC 35.3 30.0 - 36.0 g/dL   RDW 13.0 11.5 - 15.5 %   Platelets 166 150 - 400 K/uL  Basic metabolic panel     Status: Abnormal   Collection Time: 08/28/15  4:45 AM  Result Value Ref Range   Sodium 131 (L) 135 - 145 mmol/L   Potassium 4.0 3.5 - 5.1 mmol/L   Chloride 99 (L) 101 - 111 mmol/L   CO2 25 22 - 32 mmol/L   Glucose, Bld 119 (H) 65 - 99 mg/dL   BUN 10 6 - 20 mg/dL   Creatinine, Ser 1.05 0.61 - 1.24 mg/dL   Calcium 8.3 (L) 8.9 - 10.3 mg/dL   GFR calc non Af Amer >60 >60 mL/min   GFR calc Af Amer >60 >60 mL/min    Comment: (NOTE) The eGFR has been calculated using the CKD EPI equation. This calculation has not been validated in all clinical situations. eGFR's persistently <60 mL/min signify possible Chronic Kidney Disease.    Anion gap 7 5 - 15  Hepatic function panel     Status: Abnormal   Collection Time: 08/28/15  4:45 AM  Result Value Ref Range   Total Protein 5.8 (L) 6.5 - 8.1 g/dL   Albumin 2.4 (L) 3.5 - 5.0 g/dL   AST 40 15 - 41 U/L   ALT 23 17 - 63 U/L   Alkaline Phosphatase 108 38 - 126 U/L   Total Bilirubin 0.6 0.3 - 1.2 mg/dL   Bilirubin, Direct 0.1 0.1 - 0.5 mg/dL   Indirect Bilirubin 0.5 0.3 - 0.9 mg/dL  Lipase, blood     Status: None   Collection Time: 08/28/15  4:45 AM  Result Value Ref Range   Lipase 33 11 - 51 U/L  Sodium, urine, random     Status: None   Collection Time: 08/28/15  6:54 AM  Result Value Ref Range   Sodium, Ur 51 mmol/L  CBG monitoring, ED     Status: Abnormal   Collection Time: 08/28/15  7:53 AM  Result Value Ref Range   Glucose-Capillary 112 (H) 65 - 99 mg/dL    Dg Chest 2 View  08/27/2015  CLINICAL DATA:  74 year old male with chest pain EXAM: CHEST  2 VIEW COMPARISON:  Chest radiograph dated 03/23/2013  FINDINGS: Two views of the chest demonstrate mild emphysematous changes of the lungs. There is no focal consolidation, pleural effusion, or pneumothorax. 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 Stable cardiac silhouette. Median sternotomy wires and CABG vascular clips noted. There is degenerative changes of the shoulders and  spine. No acute fracture. IMPRESSION: No active cardiopulmonary disease. Electronically Signed   By: Anner Crete M.D.   On: 08/27/2015 19:59    Review of Systems  Constitutional: Negative for fever and diaphoresis.  HENT: Negative for congestion and sore throat.   Respiratory: Negative for cough and shortness of breath.   Cardiovascular: Positive for chest pain. Negative for palpitations, orthopnea, leg swelling and PND.  Gastrointestinal: Positive for nausea and vomiting. Negative for abdominal pain, blood in stool and melena.  Genitourinary: Negative for hematuria.  Musculoskeletal: Positive for back pain. Negative for myalgias.  Neurological: Negative for dizziness.  All other systems reviewed and are negative.  Blood pressure 189/71, pulse 53, temperature 98.8 F (37.1 C), temperature source Oral, resp. rate 16, height '5\' 8"'$  (1.727 m), weight 182 lb 8 oz (82.781 kg), SpO2 95 %. Physical Exam  Nursing note and vitals reviewed. Constitutional: He is oriented to person, place, and time. He appears well-developed and well-nourished. No distress.  HENT:  Head: Normocephalic and atraumatic.  Mouth/Throat: No oropharyngeal exudate.  Eyes: EOM are normal. Pupils are equal, round, and reactive to light. No scleral icterus.  Neck: Normal range of motion. Neck supple. No JVD present.  Cardiovascular: Normal rate, regular rhythm, S1 normal and S2 normal.   No  murmur heard. Pulses:      Radial pulses are 2+ on the right side, and 2+ on the left side.       Dorsalis pedis pulses are 2+ on the right side, and 2+ on the left side.  Respiratory: Effort normal and breath sounds normal. He has no wheezes. He has no rales.  GI: Soft. Bowel sounds are normal. He exhibits no distension. There is no tenderness.  Musculoskeletal: He exhibits edema (Trace LEE).  Lymphadenopathy:    He has no cervical adenopathy.  Neurological: He is alert and oriented to person, place, and time. He exhibits normal muscle tone.  Skin: Skin is warm and dry.  Psychiatric: He has a normal mood and affect.    Assessment/Plan: Active Problems:   Essential hypertension   Hyperlipidemia   CAD (coronary artery disease) of artery bypass graft   CKD (chronic kidney disease), stage III   Chest pain   Type 2 diabetes mellitus with vascular disease (HCC)   Hyponatremia  75 year old male with history of coronary artery disease, hypertension, hyperlipidemia, diabetes mellitus type 2, peripheral vascular disease, obesity.  He had coronary artery bypass grafting x3 in 2005 by Dr. Nils Pyle.   He presents with CP/epigastric pain, HTN.  Troponin is 0.05 and there are no acute ischemic changes on the EKG.  BP is ranging 153/71-192/72 and may explain his troponin elevation.  Will review echo.  A nuclear stress test is probably reasonable.  Will review echo.   BP is very high.  He received lopressor already but Cozaar has not been given yet.    Dr. Tamala Julian to see.   Tarri Fuller, [place] 08/28/2015, 10:14 AM     The patient has been seen in conjunction with Tarri Fuller, PAC. All aspects of care have been considered and discussed. The patient has been personally interviewed, examined, and all clinical data has been reviewed.   Presentation worrisome for ACS. Had recurrent pain in ER responsive to NTG. Bypass grafts >  62 years old, should have cath with coronary and Bypass angiography to r/o  suspected graft failure.  IV heparin and NTG  Plan to proceed in AM. The patient was counseled to undergo left  heart catheterization, coronary angiography, and possible percutaneous coronary intervention with stent implantation. The procedural risks and benefits were discussed in detail. The risks discussed included death, stroke, myocardial infarction, life-threatening bleeding, limb ischemia, kidney injury, allergy, and possible emergency cardiac surgery. The risk of these significant complications were estimated to occur less than 1% of the time. After discussion, the patient has agreed to proceed.

## 2015-08-28 NOTE — ED Notes (Signed)
Heart healthy/carb modified lunch tray ordered at 13:30pm. Spoke w/ Darius

## 2015-08-28 NOTE — Progress Notes (Signed)
  Echocardiogram 2D Echocardiogram has been performed.  [name] M 08/28/2015, 10:01 AM

## 2015-08-28 NOTE — ED Notes (Signed)
Carb modified breakfast tray ordered 

## 2015-08-28 NOTE — ED Notes (Signed)
Cardiology at the bedside.

## 2015-08-28 NOTE — ED Notes (Signed)
Pt transported to echo 

## 2015-08-28 NOTE — ED Notes (Signed)
MD Hager at the bedside

## 2015-08-28 NOTE — Progress Notes (Signed)
ANTICOAGULATION CONSULT NOTE - Initial Consult  Pharmacy Consult for heparin  Indication: chest pain/ACS  Allergies  Allergen Reactions  . Other Anaphylaxis    ALL BELL PEPPERS  . Statins Other (See Comments)    MYLAGIAS AND LEG NERVE DAMAGE    . Fish Oil Hives    Patient Measurements: Height: 5\' 8"  (172.7 cm) Weight: 182 lb 8 oz (82.781 kg) IBW/kg (Calculated) : 68.4   Vital Signs: BP: 166/63 mmHg (03/30 1330) Pulse Rate: 63 (03/30 1330)  Labs:  Recent Labs  08/27/15 1936 08/27/15 2336 08/28/15 0445 08/28/15 1022  HGB 10.5*  --  10.2*  --   HCT 31.1*  --  28.9*  --   PLT 190  --  166  --   CREATININE 1.19  --  1.05  --   TROPONINI  --  0.04* 0.05* 0.06*    Estimated Creatinine Clearance: 63.8 mL/min (by C-G formula based on Cr of 1.05).   Medical History: Past Medical History  Diagnosis Date  . CAD (coronary artery disease)   . HTN (hypertension)   . Hyperlipemia   . Diabetes mellitus type 2 with peripheral artery disease (HCC)   . Peripheral vascular disease (HCC)   . Benign prostatic hyperplasia   . Iron deficiency anemia   . Alcohol abuse   . Obesity      Assessment: 16 yoM admitted with CP and concern for ACS. Pharmacy to assist with heparin dosing. No known anticoagulation PTA, Lovenox 40 mg this am at ~ 0630) , CBC stable.    Goal of Therapy:  Heparin level 0.3-0.7 units/ml Monitor platelets by anticoagulation protocol: Yes   Plan:  1. Stop Lovenox 2. Heparin 2000 units bolus; partial bolus 2/2 to lovenox VTE prophylaxis dose given earlier today 3. Heparin gtt at 1000 units/hr 4. HL in 8 hours and daily HL with CBC thereafter 5. F/u cath plans  Pollyann Samples, PharmD, BCPS 08/28/2015, 1:55 PM Pager: [phone]

## 2015-08-28 NOTE — H&P (Addendum)
[facility] History and Physical  [name] SRP:[id number] DOB: [birth year] DOA: 08/27/2015  Referring physician: Dr. Madilyn Hook. PCP: Lupita Raider, MD  Specialists: Dr. Katrinka Blazing. Cardiologist.  Chief Complaint: Chest pain.  HPI: [name] is a 75 y.o. male history of CAD status post stenting and CABG, hypertension, diabetes mellitus, hyperlipidemia presents to the ER because of chest pain. Patient started developing chest pain last evening around 4 PM after driving his tractor. Pain is mostly in the lower part of the sternum and epigastrium and was associated with nausea and vomiting 3-4 times. Denies any abdominal pain and abdomen was benign on exam. Denies any shortness of breath. Pain lasted for almost 4-5 hours. Improved after patient got pain relief medication. Cardiac markers is mildly elevated. EKG was unremarkable. Patient is currently chest pain-free. Chest x-ray is unremarkable. Patient will be admitted for further management of chest pain with history of CAD status post CABG.   Review of Systems: As presented in the history of presenting illness, rest negative.  Past Medical History  Diagnosis Date  . CAD (coronary artery disease)   . HTN (hypertension)   . Hyperlipemia   . Diabetes mellitus type 2 with peripheral artery disease (HCC)   . Peripheral vascular disease (HCC)   . Benign prostatic hyperplasia   . Iron deficiency anemia   . Alcohol abuse   . Obesity    Past Surgical History  Procedure Laterality Date  . Carotid endarterectomy    . Hernia repair     Social History:  reports that he has quit smoking. He does not have any smokeless tobacco history on file. He reports that he drinks alcohol. He reports that he does not use illicit drugs. Where does patient live At home. Can patient participate in ADLs? Yes.  Allergies  Allergen Reactions  . Other Anaphylaxis    ALL BELL PEPPERS  . Statins Other (See Comments)    MYLAGIAS AND LEG NERVE DAMAGE     . Fish Oil Hives    Family History:  Family History  Problem Relation Age of Onset  . Heart attack Mother   . CAD Father       Prior to Admission medications   Medication Sig Start Date End Date Taking? Authorizing Provider  aspirin 81 MG tablet Take 81 mg by mouth daily.   Yes Historical Provider, MD  Coenzyme Q10 (COQ10) 100 MG CAPS Take 100 mg by mouth daily.   Yes Historical Provider, MD  ferrous sulfate 325 (65 FE) MG tablet Take 325 mg by mouth daily with breakfast.   Yes Historical Provider, MD  furosemide (LASIX) 40 MG tablet Take 40 mg by mouth every morning.    Yes Historical Provider, MD  losartan (COZAAR) 50 MG tablet Take 50 mg by mouth every evening.    Yes Historical Provider, MD  metFORMIN (GLUCOPHAGE) 1000 MG tablet Take 1,000 mg by mouth 2 (two) times daily with a meal.   Yes Historical Provider, MD  metoprolol (LOPRESSOR) 100 MG tablet Take 100 mg by mouth 2 (two) times daily.   Yes Historical Provider, MD  Niacin CR 1000 MG TBCR Take 1 tablet by mouth 2 (two) times daily. Take 1 tab twice a day   Yes Historical Provider, MD  nitroGLYCERIN (NITROSTAT) 0.4 MG SL tablet Place 0.4 mg under the tongue every 5 (five) minutes as needed for chest pain.   Yes Historical Provider, MD  ZETIA 10 MG tablet Take 5 mg by mouth daily. TAKES 1/2 TAB  07/30/13  Yes Historical Provider, MD    Physical Exam: Filed Vitals:   08/28/15 0145 08/28/15 0215 08/28/15 0230 08/28/15 0315  BP: 170/68 176/62 181/53 183/60  Pulse: 51 51 50 47  Temp:      TempSrc:      Resp: 17 13 14 19   Height:      Weight:      SpO2: 98% 100% 95% 99%     General:  Moderately built and nourished.  Eyes: Anicteric no pallor.  ENT: No discharge from the ears eyes nose or mouth.  Neck: No mass felt. No JVD appreciated.  Cardiovascular: S1 and S2 heard.  Respiratory: No rhonchi or crepitations.  Abdomen: Soft nontender bowel sounds present.  Skin: No rash.  Musculoskeletal: No  edema.  Psychiatric: Appears normal.  Neurologic: Alert and oriented to time place and person. Moves all extremities.  Labs on Admission:  Basic Metabolic Panel:  Recent Labs Lab 08/27/15 1936  NA 128*  K 4.2  CL 96*  CO2 21*  GLUCOSE 154*  BUN 12  CREATININE 1.19  CALCIUM 8.2*   Liver Function Tests:  Recent Labs Lab 08/27/15 2336  AST 44*  ALT 25  ALKPHOS 109  BILITOT 0.8  PROT 6.4*  ALBUMIN 2.6*    Recent Labs Lab 08/27/15 2336  LIPASE 36   No results for input(s): AMMONIA in the last 168 hours. CBC:  Recent Labs Lab 08/27/15 1936  WBC 11.1*  HGB 10.5*  HCT 31.1*  MCV 89.9  PLT 190   Cardiac Enzymes:  Recent Labs Lab 08/27/15 2336  TROPONINI 0.04*    BNP (last 3 results) No results for input(s): BNP in the last 8760 hours.  ProBNP (last 3 results) No results for input(s): PROBNP in the last 8760 hours.  CBG: No results for input(s): GLUCAP in the last 168 hours.  Radiological Exams on Admission: Dg Chest 2 View  08/27/2015  CLINICAL DATA:  75 year old male with chest pain EXAM: CHEST  2 VIEW COMPARISON:  Chest radiograph dated 03/23/2013 FINDINGS: Two views of the chest demonstrate mild emphysematous changes of the lungs. There is no focal consolidation, pleural effusion, or pneumothorax. 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 Stable cardiac silhouette. Median sternotomy wires and CABG vascular clips noted. There is degenerative changes of the shoulders and spine. No acute fracture. IMPRESSION: No active cardiopulmonary disease. Electronically Signed   By: Elgie Collard M.D.   On: 08/27/2015 19:59    EKG: Independently reviewed. Sinus bradycardia with PVCs.  Assessment/Plan Active Problems:   Essential hypertension    Hyperlipidemia   CAD (coronary artery disease) of artery bypass graft   CKD (chronic kidney disease), stage III   Chest pain   Type 2 diabetes mellitus with vascular disease (HCC)   Hyponatremia   1. Chest pain with elevated troponin concerning for unstable angina versus non-ST elevation MI - at this time patient has been kept nothing by mouth except medications. Continue aspirin. Nitroglycerin morphine cycle cardiac markers check 2-D echo. Cardiologist was consulted and at this time requested regarding cardiac markers. Patient is intolerant to statins. Continue metoprolol note that patient is mildly bradycardic. 2. Hyponatremia - probably from nausea and vomiting and dehydration. Gently hydrate check urine sodium and closely follow metabolic panel. 3. Diabetes mellitus type 2 - oral metformin while  inpatient anticipation of procedure. Patient will be on sliding scale coverage. 4. Hypertension on Cozaar and metoprolol - closely follow blood pressure trends. 5. Chronic kidney disease stage II - follow metabolic panel. Lasix on hold due to dehydration. 6. Normocytic normochromic anemia on iron supplements follow CBC. 7. Hyperlipidemia intolerant to statins.  Patient is placed on CIWA protocol since patient drinks 4-5 beers a day.   DVT Prophylaxis Lovenox.  Code Status: Full code.  Family Communication: Discussed with patient.  Disposition Plan: Admit for observation.    Boomer Winders N. [facility] Pager [phone].  If 7PM-7AM, please contact night-coverage [email or web address] Password [email or web address] 08/28/2015, 4:01 AM

## 2015-08-28 NOTE — Progress Notes (Signed)
[facility]  75 y/o male admitted by my colleague this AM for chest pain followed by vomiting. H and P reviewed, patient examined. Currently eating his meal without difficulty. Appreciate cardiology eval. Plan is for cath in AM. We will continue to follow. No acute needs at this time.   Principal Problem:   ACS (acute coronary syndrome)-  CAD (coronary artery disease) of artery bypass graft - mild Troponin elevation, EKG unrevealing - Heparin/ Nitro per cardiology- cath tomorrow  Active Problems:   Hyponatremia - likely due to vomiting- IVF    Essential hypertension - Losartan, Metoprolol   CKD (chronic kidney disease), stage III - stable  HLD - allergic to statin, on Zetia and Niacin    Type 2 diabetes mellitus with vascular disease (HCC) - hold Metformin, SSI  Alcohol use - started on CIWA protocol on admission   Calvert Cantor, MD Pager: [email or web address]

## 2015-08-29 ENCOUNTER — Encounter (HOSPITAL_COMMUNITY)
Admission: EM | Disposition: A | Discharge status: Home / Self Care | Payer: Self-pay | Source: Home / Self Care | Attending: Internal Medicine

## 2015-08-29 ENCOUNTER — Encounter (HOSPITAL_COMMUNITY): Payer: Self-pay | Admitting: Cardiology

## 2015-08-29 DIAGNOSIS — R071 Chest pain on breathing: Secondary | ICD-10-CM

## 2015-08-29 DIAGNOSIS — I25709 Atherosclerosis of coronary artery bypass graft(s), unspecified, with unspecified angina pectoris: Secondary | ICD-10-CM | POA: Insufficient documentation

## 2015-08-29 DIAGNOSIS — I25110 Atherosclerotic heart disease of native coronary artery with unstable angina pectoris: Secondary | ICD-10-CM

## 2015-08-29 DIAGNOSIS — I249 Acute ischemic heart disease, unspecified: Secondary | ICD-10-CM

## 2015-08-29 HISTORY — PX: CARDIAC CATHETERIZATION: SHX172

## 2015-08-29 LAB — HEPARIN LEVEL (UNFRACTIONATED): Heparin Unfractionated: 0.47 IU/mL (ref 0.30–0.70)

## 2015-08-29 LAB — BASIC METABOLIC PANEL
ANION GAP: 8 (ref 5–15)
BUN: 10 mg/dL (ref 6–20)
CALCIUM: 8 mg/dL — AB (ref 8.9–10.3)
CO2: 23 mmol/L (ref 22–32)
Chloride: 103 mmol/L (ref 101–111)
Creatinine, Ser: 1.03 mg/dL (ref 0.61–1.24)
Glucose, Bld: 152 mg/dL — ABNORMAL HIGH (ref 65–99)
POTASSIUM: 4 mmol/L (ref 3.5–5.1)
Sodium: 134 mmol/L — ABNORMAL LOW (ref 135–145)

## 2015-08-29 LAB — GLUCOSE, CAPILLARY
GLUCOSE-CAPILLARY: 108 mg/dL — AB (ref 65–99)
GLUCOSE-CAPILLARY: 161 mg/dL — AB (ref 65–99)
Glucose-Capillary: 130 mg/dL — ABNORMAL HIGH (ref 65–99)

## 2015-08-29 LAB — CBC WITH DIFFERENTIAL/PLATELET
Basophils Absolute: 0 10*3/uL (ref 0.0–0.1)
Basophils Relative: 0 %
Eosinophils Absolute: 0.4 10*3/uL (ref 0.0–0.7)
Eosinophils Relative: 4 %
HEMATOCRIT: 28.4 % — AB (ref 39.0–52.0)
Hemoglobin: 9.3 g/dL — ABNORMAL LOW (ref 13.0–17.0)
LYMPHS ABS: 1 10*3/uL (ref 0.7–4.0)
LYMPHS PCT: 10 %
MCH: 29.2 pg (ref 26.0–34.0)
MCHC: 32.7 g/dL (ref 30.0–36.0)
MCV: 89.3 fL (ref 78.0–100.0)
Monocytes Absolute: 1.2 10*3/uL — ABNORMAL HIGH (ref 0.1–1.0)
Monocytes Relative: 11 %
NEUTROS ABS: 7.7 10*3/uL (ref 1.7–7.7)
Neutrophils Relative %: 75 %
PLATELETS: 179 10*3/uL (ref 150–400)
RBC: 3.18 MIL/uL — ABNORMAL LOW (ref 4.22–5.81)
RDW: 13 % (ref 11.5–15.5)
WBC: 10.3 10*3/uL (ref 4.0–10.5)

## 2015-08-29 SURGERY — LEFT HEART CATH AND CORONARY ANGIOGRAPHY

## 2015-08-29 MED ORDER — LIDOCAINE HCL (PF) 1 % IJ SOLN
INTRAMUSCULAR | Status: AC
  Filled 2015-08-29: qty 30

## 2015-08-29 MED ORDER — HYDRALAZINE HCL 20 MG/ML IJ SOLN
10.0000 mg | Freq: Four times a day (QID) | INTRAMUSCULAR | Status: DC | PRN
  Administered 2015-08-29 – 2015-08-30 (×2): 10 mg via INTRAVENOUS
  Filled 2015-08-29 (×2): qty 1

## 2015-08-29 MED ORDER — IOPAMIDOL (ISOVUE-370) INJECTION 76%
INTRAVENOUS | Status: DC | PRN
  Administered 2015-08-29: 110 mL via INTRA_ARTERIAL

## 2015-08-29 MED ORDER — SODIUM CHLORIDE 0.9 % WEIGHT BASED INFUSION
1.0000 mL/kg/h | INTRAVENOUS | Status: AC
  Administered 2015-08-29: 1 mL/kg/h via INTRAVENOUS

## 2015-08-29 MED ORDER — OXYMETAZOLINE HCL 0.05 % NA SOLN
1.0000 | Freq: Two times a day (BID) | NASAL | Status: DC | PRN
  Administered 2015-08-30: 1 via NASAL
  Filled 2015-08-29: qty 15

## 2015-08-29 MED ORDER — HEPARIN SODIUM (PORCINE) 1000 UNIT/ML IJ SOLN
INTRAMUSCULAR | Status: DC | PRN
  Administered 2015-08-29: 4000 [IU] via INTRAVENOUS

## 2015-08-29 MED ORDER — MIDAZOLAM HCL 2 MG/2ML IJ SOLN
INTRAMUSCULAR | Status: AC
Start: 2015-08-29 — End: 2015-08-29
  Filled 2015-08-29: qty 2

## 2015-08-29 MED ORDER — FENTANYL CITRATE (PF) 100 MCG/2ML IJ SOLN
INTRAMUSCULAR | Status: DC | PRN
  Administered 2015-08-29: 50 ug via INTRAVENOUS

## 2015-08-29 MED ORDER — SODIUM CHLORIDE 0.9% FLUSH: 3.0000 mL | INTRAVENOUS | Status: DC | PRN

## 2015-08-29 MED ORDER — LIDOCAINE HCL (PF) 1 % IJ SOLN
INTRAMUSCULAR | Status: DC | PRN
  Administered 2015-08-29: 2 mL via INTRADERMAL

## 2015-08-29 MED ORDER — IOPAMIDOL (ISOVUE-370) INJECTION 76%
INTRAVENOUS | Status: AC
  Filled 2015-08-29: qty 50

## 2015-08-29 MED ORDER — AMLODIPINE BESYLATE 5 MG PO TABS
5.0000 mg | ORAL_TABLET | Freq: Once | ORAL | Status: DC
  Filled 2015-08-29: qty 1

## 2015-08-29 MED ORDER — IOPAMIDOL (ISOVUE-370) INJECTION 76%
INTRAVENOUS | Status: AC
  Filled 2015-08-29: qty 100

## 2015-08-29 MED ORDER — SODIUM CHLORIDE 0.9% FLUSH
3.0000 mL | Freq: Two times a day (BID) | INTRAVENOUS | Status: DC
  Administered 2015-08-29 – 2015-08-30 (×3): 3 mL via INTRAVENOUS

## 2015-08-29 MED ORDER — GI COCKTAIL ~~LOC~~
30.0000 mL | Freq: Once | ORAL | Status: AC
  Administered 2015-08-29: 30 mL via ORAL
  Filled 2015-08-29: qty 30

## 2015-08-29 MED ORDER — HEPARIN (PORCINE) IN NACL 2-0.9 UNIT/ML-% IJ SOLN
INTRAMUSCULAR | Status: AC
  Filled 2015-08-29: qty 1000

## 2015-08-29 MED ORDER — HEPARIN (PORCINE) IN NACL 2-0.9 UNIT/ML-% IJ SOLN
INTRAMUSCULAR | Status: DC | PRN
  Administered 2015-08-29: 17:00:00 via INTRA_ARTERIAL

## 2015-08-29 MED ORDER — HEPARIN (PORCINE) IN NACL 2-0.9 UNIT/ML-% IJ SOLN
INTRAMUSCULAR | Status: DC | PRN
  Administered 2015-08-29: 1000 mL

## 2015-08-29 MED ORDER — FENTANYL CITRATE (PF) 100 MCG/2ML IJ SOLN
INTRAMUSCULAR | Status: AC
  Filled 2015-08-29: qty 2

## 2015-08-29 MED ORDER — VERAPAMIL HCL 2.5 MG/ML IV SOLN
INTRAVENOUS | Status: AC
  Filled 2015-08-29: qty 2

## 2015-08-29 MED ORDER — HEPARIN SODIUM (PORCINE) 1000 UNIT/ML IJ SOLN
INTRAMUSCULAR | Status: AC
  Filled 2015-08-29: qty 1

## 2015-08-29 MED ORDER — MIDAZOLAM HCL 2 MG/2ML IJ SOLN
INTRAMUSCULAR | Status: DC | PRN
  Administered 2015-08-29: 2 mg via INTRAVENOUS

## 2015-08-29 MED ORDER — SODIUM CHLORIDE 0.9 % IV SOLN: 250.0000 mL | INTRAVENOUS | Status: DC | PRN

## 2015-08-29 SURGICAL SUPPLY — 10 items
CATH INFINITI 5 FR IM (CATHETERS) ×3 IMPLANT
CATH INFINITI 5FR MULTPACK ANG (CATHETERS) ×3 IMPLANT
DEVICE RAD COMP TR BAND LRG (VASCULAR PRODUCTS) ×3 IMPLANT
GLIDESHEATH SLEND A-KIT 6F 22G (SHEATH) ×3 IMPLANT
KIT HEART LEFT (KITS) ×3 IMPLANT
PACK CARDIAC CATHETERIZATION (CUSTOM PROCEDURE TRAY) ×3 IMPLANT
SYR MEDRAD MARK V 150ML (SYRINGE) ×3 IMPLANT
TRANSDUCER W/STOPCOCK (MISCELLANEOUS) ×3 IMPLANT
TUBING CIL FLEX 10 FLL-RA (TUBING) ×3 IMPLANT
WIRE SAFE-T 1.5MM-J .035X260CM (WIRE) ×3 IMPLANT

## 2015-08-29 NOTE — Interval H&P Note (Signed)
History and Physical Interval Note:  08/29/2015 4:13 PM  [name]  has presented today for surgery, with the diagnosis of acs/Unstable Angina.  The various methods of treatment have been discussed with the patient and family. After consideration of risks, benefits and other options for treatment, the patient has consented to  Procedure(s): Left Heart Cath and Coronary Angiography (N/A) = & Graft Angiography with Possible Percutaneous Coronary Intervention as a surgical intervention .  The patient's history has been reviewed, patient examined, no change in status, stable for surgery.  I have reviewed the patient's chart and labs.  Questions were answered to the patient's satisfaction.    Cath Lab Visit (complete for each Cath Lab visit)  Clinical Evaluation Leading to the Procedure:   ACS: Yes.    Non-ACS:    Anginal Classification: CCS IV  Anti-ischemic medical therapy: Maximal Therapy (2 or more classes of medications)  Non-Invasive Test Results: No non-invasive testing performed  Prior CABG: Previous CABG  TIMI SCORE  Patient Information:  TIMI Score is 5   A/NSTEMI and high-risk features for short-term risk of death or nonfatal MI Revascularization of the presumed culprit artery  A (9)  Indication: 11; Score: 9 TIMI SCORE  Patient Information:  TIMI Score is 5   A/NSTEMI and high-risk features for short-term risk of death or nonfatal MI Revascularization of multiple coronary arteries when the culprit artery cannot clearly be determined  A (9)  Indication: 12; Score: 9   HARDING, DAVID W

## 2015-08-29 NOTE — Progress Notes (Addendum)
       Patient Name: [name] Date of Encounter: 08/29/2015    SUBJECTIVE: Presented with unstable angina and suspected bypass graft failure. Pain-free since initiation of IV heparin and nitroglycerin.  TELEMETRY:  Normal sinus rhythm: Filed Vitals:   08/28/15 1759 08/29/15 0429 08/29/15 0430 08/29/15 1000  BP: 189/71 161/58 159/63 170/58  Pulse:  57  61  Temp:  98.4 F (36.9 C)  98.1 F (36.7 C)  TempSrc:  Oral  Oral  Resp:  16    Height:      Weight:   178 lb 4.8 oz (80.876 kg)   SpO2:  92%  90%    Intake/Output Summary (Last 24 hours) at 08/29/15 1036 Last data filed at 08/29/15 0758  Gross per 24 hour  Intake 2569.43 ml  Output    650 ml  Net 1919.43 ml   LABS: Basic Metabolic Panel:  Recent Labs  08/28/15 0445 08/29/15 0512  NA 131* 134*  K 4.0 4.0  CL 99* 103  CO2 25 23  GLUCOSE 119* 152*  BUN 10 10  CREATININE 1.05 1.03  CALCIUM 8.3* 8.0*   CBC:  Recent Labs  08/28/15 0445 08/29/15 0512  WBC 10.1 10.3  NEUTROABS  --  7.7  HGB 10.2* 9.3*  HCT 28.9* 28.4*  MCV 89.8 89.3  PLT 166 179   Cardiac Enzymes:  Recent Labs  08/28/15 0445 08/28/15 1022 08/28/15 1557  TROPONINI 0.05* 0.06* 0.04*     Radiology/Studies:  No new data  Physical Exam: Blood pressure 170/58, pulse 61, temperature 98.1 F (36.7 C), temperature source Oral, resp. rate 16, height 5' 8" (1.727 m), weight 178 lb 4.8 oz (80.876 kg), SpO2 90 %. Weight change: -4 lb 13 oz (-2.182 kg)  Wt Readings from Last 3 Encounters:  08/29/15 178 lb 4.8 oz (80.876 kg)  09/27/14 152 lb 1.9 oz (69.001 kg)  09/26/13 193 lb (87.544 kg)    Chest is clear Unremarkable cardiac exam No peripheral edema  ASSESSMENT:  1. Non-ST elevation ACS suspect related to bypass graft failure 2. Hyponatremia improved 3. Hypertension is uncontrolled  Plan:  1. Remain nothing by mouth 2. Cath orders written yesterday 3. Intensify antihypertensive therapy: 1 dose of amlodipine today  since diuretics on hold for cath. Signed, SMITH III,HENRY W 08/29/2015, 10:36 AM 

## 2015-08-29 NOTE — Progress Notes (Signed)
ANTICOAGULATION CONSULT NOTE - Follow Up Consult  Pharmacy Consult for Heparin  Indication: chest pain/ACS   Patient Measurements: Height: 5\' 8"  (172.7 cm) Weight: 178 lb 4.8 oz (80.876 kg) IBW/kg (Calculated) : 68.4  Vital Signs: Temp: 98.4 F (36.9 C) (03/31 0429) Temp Source: Oral (03/31 0429) BP: 159/63 mmHg (03/31 0430) Pulse Rate: 57 (03/31 0429)  Labs:  Recent Labs  08/27/15 1936  08/28/15 0445 08/28/15 1022 08/28/15 1557 08/28/15 1830 08/28/15 2147 08/29/15 0512  HGB 10.5*  --  10.2*  --   --   --   --  9.3*  HCT 31.1*  --  28.9*  --   --   --   --  28.4*  PLT 190  --  166  --   --   --   --  179  LABPROT  --   --   --   --   --  14.8  --   --   INR  --   --   --   --   --  1.14  --   --   HEPARINUNFRC  --   --   --   --   --   --  0.39 0.47  CREATININE 1.19  --  1.05  --   --   --   --  1.03  TROPONINI  --   < > 0.05* 0.06* 0.04*  --   --   --   < > = values in this interval not displayed.  Estimated Creatinine Clearance: 60 mL/min (by C-G formula based on Cr of 1.03).   Assessment: Started on heparin, likely cath in AM, heparin level therapeutic  Goal of Therapy:  Heparin level 0.3-0.7 units/ml Monitor platelets by anticoagulation protocol: Yes   Plan:  -Cont heparin 1000 units/hr -Follow up after cath  Thank you Okey Regal, PharmD [phone]  08/29/2015,8:57 AM

## 2015-08-29 NOTE — Progress Notes (Signed)
Text paged doctor to see if they wanted nitro drip continued for its been discontinued in epic. Also patient is requesting something for congestion, notified in text of patient's blood pressure elevated to 184/72 with no PRNs to help lower it, as well as finding out if patient can advance to a heart healthy diet. Awaiting return page.

## 2015-08-29 NOTE — Progress Notes (Signed)
[facility] PROGRESS NOTE  [name]:[id number] DOB: 27-Mar-1941 DOA: 08/27/2015 PCP: Lupita Raider, MD  Length of stay: 1   Assessment/Plan: Principal Problem:   ACS (acute coronary syndrome) (HCC) Active Problems:   Essential hypertension   Hyperlipidemia   CAD (coronary artery disease) of artery bypass graft   CKD (chronic kidney disease), stage III   Type 2 diabetes mellitus with vascular disease (HCC)   Hyponatremia     75 y/o male admitted by my colleague this AM for chest pain followed by vomiting. H and P reviewed, patient examined. Currently eating his meal without difficulty. Appreciate cardiology eval. Plan is for cath in AM. We will continue to follow. No acute needs at this time.   Principal Problem:  ACS (acute coronary syndrome)- CAD (coronary artery disease) of artery bypass graft - mild Troponin elevation, EKG unrevealing - Heparin/ Nitro per cardiology- cath today Presentation worrisome for ACS. Had recurrent pain in ER responsive to NTG. Bypass grafts > 61 years old, should have cath with coronary and Bypass angiography to r/o suspected graft failure.    Hyponatremia, 128>134 - likely due to vomiting- cont  IVF   Essential hypertension - Losartan, Metoprolol  CKD (chronic kidney disease), stage III - stable  HLD - allergic to statin, on Zetia and Niacin   Type 2 diabetes mellitus with vascular disease (HCC) - hold Metformin, SSI  Alcohol use - started on CIWA protocol on admission      DVT prophylaxsis heparin gtt  Code Status:      Code Status Orders        Start     Ordered   08/28/15 0359  Full code   Continuous     08/28/15 0400    Code Status History    Date Active Date Inactive Code Status Order ID Comments User Context   This patient has a current code status but no historical code status.      Family Communication: Discussed in detail with the patient, all imaging results, lab results  explained to the patient   Disposition Plan:  Pending results of cath      Consultants:  cardiology  Procedures:  None   Antibiotics: Anti-infectives    None         HPI/Subjective: No cp this morning , no sob   Objective: Filed Vitals:   08/28/15 1651 08/28/15 1759 08/29/15 0429 08/29/15 0430  BP: 188/68 189/71 161/58 159/63  Pulse: 57  57   Temp:   98.4 F (36.9 C)   TempSrc:   Oral   Resp:   16   Height:      Weight:    80.876 kg (178 lb 4.8 oz)  SpO2: 99%  92%     Intake/Output Summary (Last 24 hours) at 08/29/15 9562 Last data filed at 08/29/15 1308  Gross per 24 hour  Intake 2569.43 ml  Output    650 ml  Net 1919.43 ml    Exam:  General: No acute respiratory distress Lungs: Clear to auscultation bilaterally without wheezes or crackles Cardiovascular: Regular rate and rhythm without murmur gallop or rub normal S1 and S2 Abdomen: Nontender, nondistended, soft, bowel sounds positive, no rebound, no ascites, no appreciable mass Extremities: No significant cyanosis, clubbing, or edema bilateral lower extremities     Data Review   Micro Results No results found for this or any previous visit (from the past 240 hour(s)).  Radiology Reports Dg Chest 2 View  08/27/2015  CLINICAL DATA:  75 year old male with chest pain EXAM: CHEST  2 VIEW COMPARISON:  Chest radiograph dated 03/23/2013 FINDINGS: Two views of the chest demonstrate mild emphysematous changes of the lungs. There is no focal consolidation, pleural effusion, or pneumothorax. 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 Stable cardiac silhouette. Median sternotomy wires and CABG vascular clips noted. There is degenerative changes of the shoulders and spine. No acute fracture. IMPRESSION: No  active cardiopulmonary disease. Electronically Signed   By: Elgie Collard M.D.   On: 08/27/2015 19:59     CBC  Recent Labs Lab 08/27/15 1936 08/28/15 0445 08/29/15 0512  WBC 11.1* 10.1 10.3  HGB 10.5* 10.2* 9.3*  HCT 31.1* 28.9* 28.4*  PLT 190 166 179  MCV 89.9 89.8 89.3  MCH 30.3 31.7 29.2  MCHC 33.8 35.3 32.7  RDW 12.9 13.0 13.0  LYMPHSABS  --   --  1.0  MONOABS  --   --  1.2*  EOSABS  --   --  0.4  BASOSABS  --   --  0.0    Chemistries   Recent Labs Lab 08/27/15 1936 08/27/15 2336 08/28/15 0445 08/29/15 0512  NA 128*  --  131* 134*  K 4.2  --  4.0 4.0  CL 96*  --  99* 103  CO2 21*  --  25 23  GLUCOSE 154*  --  119* 152*  BUN 12  --  10 10  CREATININE 1.19  --  1.05 1.03  CALCIUM 8.2*  --  8.3* 8.0*  AST  --  44* 40  --   ALT  --  25 23  --   ALKPHOS  --  109 108  --   BILITOT  --  0.8 0.6  --    ------------------------------------------------------------------------------------------------------------------ estimated creatinine clearance is 60 mL/min (by C-G formula based on Cr of 1.03). ------------------------------------------------------------------------------------------------------------------ No results for input(s): HGBA1C in the last 72 hours. ------------------------------------------------------------------------------------------------------------------ No results for input(s): CHOL, HDL, LDLCALC, TRIG, CHOLHDL, LDLDIRECT in the last 72 hours. ------------------------------------------------------------------------------------------------------------------ No results for input(s): TSH, T4TOTAL, T3FREE, THYROIDAB in the last 72 hours.  Invalid input(s): FREET3 ------------------------------------------------------------------------------------------------------------------ No results for input(s): VITAMINB12, FOLATE, FERRITIN, TIBC, IRON, RETICCTPCT in the last 72 hours.  Coagulation profile  Recent Labs Lab 08/28/15 1830  INR 1.14     No results for input(s): DDIMER in the last 72 hours.  Cardiac Enzymes  Recent Labs Lab 08/28/15 0445 08/28/15 1022 08/28/15 1557  TROPONINI 0.05* 0.06* 0.04*   ------------------------------------------------------------------------------------------------------------------ Invalid input(s): POCBNP   CBG:  Recent Labs Lab 08/28/15 0753 08/28/15 1154 08/28/15 1654 08/28/15 2226 08/29/15 0616  GLUCAP 112* 93 178* 170* 130*       Studies: Dg Chest 2 View  08/27/2015  CLINICAL DATA:  75 year old male with chest pain EXAM: CHEST  2 VIEW COMPARISON:  Chest radiograph dated 03/23/2013 FINDINGS: Two views of the chest demonstrate mild emphysematous changes of the lungs. There is no focal consolidation, pleural effusion, or pneumothorax. 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 Stable cardiac silhouette. Median sternotomy wires and CABG vascular clips noted. There is degenerative changes of the shoulders and spine. No  acute fracture. IMPRESSION: No active cardiopulmonary disease. Electronically Signed   By: Elgie Collard M.D.   On: 08/27/2015 19:59      No results found for: HGBA1C Lab Results  Component Value Date   CREATININE 1.03 08/29/2015       Scheduled Meds: . aspirin EC  325 mg Oral Daily  . ezetimibe  5 mg Oral Daily  . ferrous sulfate  325 mg Oral Q breakfast  . folic acid  1 mg Oral Daily  . insulin aspart  0-9 Units Subcutaneous TID WC  . LORazepam  0-4 mg Oral Q6H   Followed by  . [START ON 08/30/2015] LORazepam  0-4 mg Oral Q12H  . losartan  50 mg Oral QPM  . metoprolol  100 mg Oral BID  . multivitamin with minerals  1 tablet Oral Daily  . niacin  1,000 mg Oral BID  . sodium chloride flush  3 mL Intravenous Q12H  . thiamine  100 mg Oral  Daily   Continuous Infusions: . sodium chloride 1 mL/kg/hr (08/29/15 0017)  . heparin 1,000 Units/hr (08/28/15 1829)  . nitroGLYCERIN 20 mcg/min (08/28/15 1900)    Principal Problem:   ACS (acute coronary syndrome) (HCC) Active Problems:   Essential hypertension   Hyperlipidemia   CAD (coronary artery disease) of artery bypass graft   CKD (chronic kidney disease), stage III   Type 2 diabetes mellitus with vascular disease (HCC)   Hyponatremia    Time spent: 45 minutes   [facility]  [facility] Pager [phone]. If 7PM-7AM, please contact night-coverage at [email or web address], password [email or web address] 08/29/2015, 9:42 AM  LOS: 1 day

## 2015-08-29 NOTE — H&P (View-Only) (Signed)
       Patient Name: [name] Date of Encounter: 08/29/2015    SUBJECTIVE: Presented with unstable angina and suspected bypass graft failure. Pain-free since initiation of IV heparin and nitroglycerin.  TELEMETRY:  Normal sinus rhythm: Filed Vitals:   08/28/15 1759 08/29/15 0429 08/29/15 0430 08/29/15 1000  BP: 189/71 161/58 159/63 170/58  Pulse:  57  61  Temp:  98.4 F (36.9 C)  98.1 F (36.7 C)  TempSrc:  Oral  Oral  Resp:  16    Height:      Weight:   178 lb 4.8 oz (80.876 kg)   SpO2:  92%  90%    Intake/Output Summary (Last 24 hours) at 08/29/15 1036 Last data filed at 08/29/15 0758  Gross per 24 hour  Intake 2569.43 ml  Output    650 ml  Net 1919.43 ml   LABS: Basic Metabolic Panel:  Recent Labs  91/66/06 0445 08/29/15 0512  NA 131* 134*  K 4.0 4.0  CL 99* 103  CO2 25 23  GLUCOSE 119* 152*  BUN 10 10  CREATININE 1.05 1.03  CALCIUM 8.3* 8.0*   CBC:  Recent Labs  08/28/15 0445 08/29/15 0512  WBC 10.1 10.3  NEUTROABS  --  7.7  HGB 10.2* 9.3*  HCT 28.9* 28.4*  MCV 89.8 89.3  PLT 166 179   Cardiac Enzymes:  Recent Labs  08/28/15 0445 08/28/15 1022 08/28/15 1557  TROPONINI 0.05* 0.06* 0.04*     Radiology/Studies:  No new data  Physical Exam: Blood pressure 170/58, pulse 61, temperature 98.1 F (36.7 C), temperature source Oral, resp. rate 16, height 5\' 8"  (1.727 m), weight 178 lb 4.8 oz (80.876 kg), SpO2 90 %. Weight change: -4 lb 13 oz (-2.182 kg)  Wt Readings from Last 3 Encounters:  08/29/15 178 lb 4.8 oz (80.876 kg)  09/27/14 152 lb 1.9 oz (69.001 kg)  09/26/13 193 lb (87.544 kg)    Chest is clear Unremarkable cardiac exam No peripheral edema  ASSESSMENT:  1. Non-ST elevation ACS suspect related to bypass graft failure 2. Hyponatremia improved 3. Hypertension is uncontrolled  Plan:  1. Remain nothing by mouth 2. Cath orders written yesterday 3. Intensify antihypertensive therapy: 1 dose of amlodipine today  since diuretics on hold for cath. Selinda Eon 08/29/2015, 10:36 AM

## 2015-08-29 NOTE — Progress Notes (Signed)
Not able to get in contact with cardiologist on-cal after two pages for he had an acute priority. On-call hospitalist able to address issue with elevated blood pressure, sinus congestion, and diet. Orders to stop nitroglycerin drip per discontinued order and to given hydralazine as needed for elevated blood pressure. Nasal spray ordered for sinus congestion and will administer when available from pharmacy. Will continue to monitor patient to end of shift and administer meds per doctor's order.

## 2015-08-29 NOTE — Progress Notes (Signed)
Pt with Cath scheduled for 3PM. Labs and EKG within date. Pt declined video. Consent signed and in chart.

## 2015-08-30 DIAGNOSIS — R079 Chest pain, unspecified: Secondary | ICD-10-CM | POA: Insufficient documentation

## 2015-08-30 LAB — GLUCOSE, CAPILLARY
GLUCOSE-CAPILLARY: 123 mg/dL — AB (ref 65–99)
GLUCOSE-CAPILLARY: 116 mg/dL — AB (ref 65–99)
Glucose-Capillary: 97 mg/dL (ref 65–99)
Glucose-Capillary: 116 mg/dL — ABNORMAL HIGH (ref 65–99)

## 2015-08-30 LAB — BASIC METABOLIC PANEL
ANION GAP: 6 (ref 5–15)
BUN: 5 mg/dL — ABNORMAL LOW (ref 6–20)
CHLORIDE: 99 mmol/L — AB (ref 101–111)
CO2: 22 mmol/L (ref 22–32)
Calcium: 7.6 mg/dL — ABNORMAL LOW (ref 8.9–10.3)
Creatinine, Ser: 0.93 mg/dL (ref 0.61–1.24)
GFR calc non Af Amer: >60 mL/min (ref >60)
Glucose, Bld: 195 mg/dL — ABNORMAL HIGH (ref 65–99)
POTASSIUM: 3.8 mmol/L (ref 3.5–5.1)
SODIUM: 127 mmol/L — AB (ref 135–145)

## 2015-08-30 MED ORDER — TRIAMTERENE-HCTZ 37.5-25 MG PO TABS
1.0000 | ORAL_TABLET | Freq: Every day | ORAL | Status: DC
  Administered 2015-08-30 – 2015-08-31 (×2): 1 via ORAL
  Filled 2015-08-30 (×3): qty 1

## 2015-08-30 MED ORDER — ENOXAPARIN SODIUM 40 MG/0.4ML ~~LOC~~ SOLN
40.0000 mg | SUBCUTANEOUS | Status: DC
  Administered 2015-08-30: 40 mg via SUBCUTANEOUS
  Filled 2015-08-30: qty 0.4

## 2015-08-30 MED ORDER — LOSARTAN POTASSIUM 50 MG PO TABS
50.0000 mg | ORAL_TABLET | Freq: Two times a day (BID) | ORAL | Status: DC
  Administered 2015-08-30 – 2015-08-31 (×3): 50 mg via ORAL
  Filled 2015-08-30 (×3): qty 1

## 2015-08-30 MED ORDER — AMLODIPINE BESYLATE 5 MG PO TABS: 5.0000 mg | ORAL_TABLET | Freq: Every day | ORAL | Status: DC

## 2015-08-30 NOTE — Progress Notes (Addendum)
   Subjective: Sinus congestion  NO CP   Objective: Filed Vitals:   08/29/15 2025 08/29/15 2136 08/30/15 0053 08/30/15 0644  BP: 184/72 186/70 157/61 166/61  Pulse: 57  61 54  Temp:  98.1 F (36.7 C) 97.8 F (36.6 C) 98 F (36.7 C)  TempSrc:   Oral Oral  Resp:   18   Height:      Weight:    176 lb 1.6 oz (79.878 kg)  SpO2:   93% 92%   Weight change: -1 lb 9.5 oz (-0.722 kg)  Intake/Output Summary (Last 24 hours) at 08/30/15 0924 Last data filed at 08/30/15 0849  Gross per 24 hour  Intake    243 ml  Output    500 ml  Net   -257 ml    General: Alert, awake, oriented x3, in no acute distress Neck:  JVP is normal Heart: Regular rate and rhythm, without murmurs, rubs, gallops.  Lungs: Some decreased airflow  . Exemities:  No edema.   Neuro: Grossly intact, nonfocal.  Tele:  SR with PACs   Lab Results: Results for orders placed or performed during the hospital encounter of 08/27/15 (from the past 24 hour(s))  Glucose, capillary     Status: Abnormal   Collection Time: 08/29/15 11:22 AM  Result Value Ref Range   Glucose-Capillary 108 (H) 65 - 99 mg/dL  Glucose, capillary     Status: Abnormal   Collection Time: 08/29/15 10:21 PM  Result Value Ref Range   Glucose-Capillary 161 (H) 65 - 99 mg/dL   Comment 1 Notify RN    Comment 2 Document in Chart   Glucose, capillary     Status: None   Collection Time: 08/30/15  6:20 AM  Result Value Ref Range   Glucose-Capillary 97 65 - 99 mg/dL   Comment 1 Notify RN    Comment 2 Document in Chart     Studies/Results: No results found.  Medications: Reviewed  @PROBHOSP @  1  CAD  Cath yesterday with diffuse dz of native vessel  Patent grafts  Would recomm continued medical Rx    WIll titrate as bp tolerates    2  HTN  BP is up Did not tolerate amlodipine in past  Edema    3  HL  Intol to statins  On Zetia    Ambulate and follow BP Poss home tomorrow    LOS: 2 days   [name] 08/30/2015, 9:24 AM

## 2015-08-30 NOTE — Progress Notes (Signed)
[facility] PROGRESS NOTE  [name] ZOX:[id number] DOB: 1940-09-09 DOA: 08/27/2015 PCP: Lupita Raider, MD  Length of stay: 2   Assessment/Plan: Principal Problem:   ACS (acute coronary syndrome) (HCC) Active Problems:   Essential hypertension   Hyperlipidemia   CAD (coronary artery disease) of artery bypass graft   CKD (chronic kidney disease), stage III   Type 2 diabetes mellitus with vascular disease (HCC)   Hyponatremia   Coronary artery disease involving native coronary artery of native heart with unstable angina pectoris (HCC)   Pain in the chest     75 y/o male admitted by my colleague this AM for chest pain followed by vomiting. H and P reviewed, patient examined. Currently eating his meal without difficulty. Appreciate cardiology eval. Plan is for cath in AM. We will continue to follow. No acute needs at this time.   Principal Problem:  ACS (acute coronary syndrome)- CAD (coronary artery disease) of artery bypass graft - mild Troponin elevation, EKG unrevealing, - Heparin/ Nitro per cardiology- cath 3/31 diffuse dz of native vessel Patent grafts Would recomm continued medical Rx WIll titrate as bp tolerates  Cardiology recommends continued monitoring pending improvement in his blood pressure prior to discharge, up titrating medications    Hyponatremia, 128>134 - likely due to vomiting- cont  IVF, repeat labs tomorrow   Essential hypertension uncontrolled, Currently on Norvasc, Cozaar, metoprolol, Maxzide,   CKD (chronic kidney disease), stage III - stable  HLD - allergic to statin, on Zetia and Niacin   Type 2 diabetes mellitus with vascular disease (HCC) - hold Metformin, SSI  Alcohol use - started on CIWA protocol on admission      DVT prophylaxsis Lovenox   Code Status:      Code Status Orders        Start     Ordered   08/28/15 0359  Full code   Continuous     08/28/15 0400    Code Status History    Date  Active Date Inactive Code Status Order ID Comments User Context   This patient has a current code status but no historical code status.      Family Communication: Discussed in detail with the patient, all imaging results, lab results explained to the patient   Disposition Plan:  Pending improvement in blood pressure, tomorrow      Consultants:  cardiology  Procedures:  None   Antibiotics: Anti-infectives    None         HPI/Subjective: No cp this morning , no sob   Objective: Filed Vitals:   08/29/15 2136 08/30/15 0053 08/30/15 0644 08/30/15 0928  BP: 186/70 157/61 166/61 169/61  Pulse:  61 54 68  Temp: 98.1 F (36.7 C) 97.8 F (36.6 C) 98 F (36.7 C)   TempSrc:  Oral Oral   Resp:  18    Height:      Weight:   79.878 kg (176 lb 1.6 oz)   SpO2:  93% 92%     Intake/Output Summary (Last 24 hours) at 08/30/15 1035 Last data filed at 08/30/15 0849  Gross per 24 hour  Intake    243 ml  Output    500 ml  Net   -257 ml    Exam:  General: No acute respiratory distress Lungs: Clear to auscultation bilaterally without wheezes or crackles Cardiovascular: Regular rate and rhythm without murmur gallop or rub normal S1 and S2 Abdomen: Nontender, nondistended, soft, bowel sounds positive, no  rebound, no ascites, no appreciable mass Extremities: No significant cyanosis, clubbing, or edema bilateral lower extremities     Data Review   Micro Results No results found for this or any previous visit (from the past 240 hour(s)).  Radiology Reports Dg Chest 2 View  08/27/2015  CLINICAL DATA:  75 year old male with chest pain EXAM: CHEST  2 VIEW COMPARISON:  Chest radiograph dated 03/23/2013 FINDINGS: Two views of the chest demonstrate mild emphysematous changes of the lungs. There is no focal consolidation, pleural effusion, or pneumothorax. Stable appearing hazy density at the left costophrenic angle on the lateral projection corresponds to the small fat containing  posterior diaphragmatic hernia seen on the CT dated 04/20/2007 with associated mild atelectatic changes of the adjacent lung. Stable cardiac silhouette. Median sternotomy wires and CABG vascular clips noted. There is degenerative changes of the shoulders and spine. No acute fracture. IMPRESSION: No active cardiopulmonary disease. Electronically Signed   By: Elgie Collard M.D.   On: 08/27/2015 19:59     CBC  Recent Labs Lab 08/27/15 1936 08/28/15 0445 08/29/15 0512  WBC 11.1* 10.1 10.3  HGB 10.5* 10.2* 9.3*  HCT 31.1* 28.9* 28.4*  PLT 190 166 179  MCV 89.9 89.8 89.3  MCH 30.3 31.7 29.2  MCHC 33.8 35.3 32.7  RDW 12.9 13.0 13.0  LYMPHSABS  --   --  1.0  MONOABS  --   --  1.2*  EOSABS  --   --  0.4  BASOSABS  --   --  0.0    Chemistries   Recent Labs Lab 08/27/15 1936 08/27/15 2336 08/28/15 0445 08/29/15 0512  NA 128*  --  131* 134*  K 4.2  --  4.0 4.0  CL 96*  --  99* 103  CO2 21*  --  25 23  GLUCOSE 154*  --  119* 152*  BUN 12  --  10 10  CREATININE 1.19  --  1.05 1.03  CALCIUM 8.2*  --  8.3* 8.0*  AST  --  44* 40  --   ALT  --  25 23  --   ALKPHOS  --  109 108  --   BILITOT  --  0.8 0.6  --    ------------------------------------------------------------------------------------------------------------------ estimated creatinine clearance is 60 mL/min (by C-G formula based on Cr of 1.03). ------------------------------------------------------------------------------------------------------------------ No results for input(s): HGBA1C in the last 72 hours. ------------------------------------------------------------------------------------------------------------------ No results for input(s): CHOL, HDL, LDLCALC, TRIG, CHOLHDL, LDLDIRECT in the last 72 hours. ------------------------------------------------------------------------------------------------------------------ No results for input(s): TSH, T4TOTAL, T3FREE, THYROIDAB in the last 72 hours.  Invalid  input(s): FREET3 ------------------------------------------------------------------------------------------------------------------ No results for input(s): VITAMINB12, FOLATE, FERRITIN, TIBC, IRON, RETICCTPCT in the last 72 hours.  Coagulation profile  Recent Labs Lab 08/28/15 1830  INR 1.14    No results for input(s): DDIMER in the last 72 hours.  Cardiac Enzymes  Recent Labs Lab 08/28/15 0445 08/28/15 1022 08/28/15 1557  TROPONINI 0.05* 0.06* 0.04*   ------------------------------------------------------------------------------------------------------------------ Invalid input(s): POCBNP   CBG:  Recent Labs Lab 08/28/15 2226 08/29/15 0616 08/29/15 1122 08/29/15 2221 08/30/15 0620  GLUCAP 170* 130* 108* 161* 97       Studies: No results found.    No results found for: HGBA1C Lab Results  Component Value Date   CREATININE 1.03 08/29/2015       Scheduled Meds: . amLODipine  5 mg Oral Once  . aspirin EC  325 mg Oral Daily  . ezetimibe  5 mg Oral Daily  . ferrous sulfate  325 mg Oral Q breakfast  . folic acid  1 mg Oral Daily  . insulin aspart  0-9 Units Subcutaneous TID WC  . LORazepam  0-4 mg Oral Q12H  . losartan  50 mg Oral BID  . metoprolol  100 mg Oral BID  . multivitamin with minerals  1 tablet Oral Daily  . niacin  1,000 mg Oral BID  . sodium chloride flush  3 mL Intravenous Q12H  . thiamine  100 mg Oral Daily  . triamterene-hydrochlorothiazide  1 tablet Oral Daily   Continuous Infusions:    Principal Problem:   ACS (acute coronary syndrome) (HCC) Active Problems:   Essential hypertension   Hyperlipidemia   CAD (coronary artery disease) of artery bypass graft   CKD (chronic kidney disease), stage III   Type 2 diabetes mellitus with vascular disease (HCC)   Hyponatremia   Coronary artery disease involving native coronary artery of native heart with unstable angina pectoris (HCC)   Pain in the chest    Time spent: 45  minutes   [facility]  [facility] Pager [phone]. If 7PM-7AM, please contact night-coverage at [email or web address], password [facility] 08/30/2015, 10:35 AM  LOS: 2 days

## 2015-08-30 NOTE — Progress Notes (Signed)
Error

## 2015-08-31 DIAGNOSIS — R072 Precordial pain: Secondary | ICD-10-CM

## 2015-08-31 LAB — COMPREHENSIVE METABOLIC PANEL
ALBUMIN: 2.2 g/dL — AB (ref 3.5–5.0)
ALK PHOS: 103 U/L (ref 38–126)
ALT: 18 U/L (ref 17–63)
ANION GAP: 8 (ref 5–15)
AST: 29 U/L (ref 15–41)
BILIRUBIN TOTAL: 0.7 mg/dL (ref 0.3–1.2)
BUN: 8 mg/dL (ref 6–20)
CALCIUM: 8.2 mg/dL — AB (ref 8.9–10.3)
CO2: 22 mmol/L (ref 22–32)
CREATININE: 1.06 mg/dL (ref 0.61–1.24)
Chloride: 100 mmol/L — ABNORMAL LOW (ref 101–111)
GFR calc Af Amer: >60 mL/min (ref >60)
GFR calc non Af Amer: >60 mL/min (ref >60)
GLUCOSE: 94 mg/dL (ref 65–99)
Potassium: 3.8 mmol/L (ref 3.5–5.1)
Sodium: 130 mmol/L — ABNORMAL LOW (ref 135–145)
TOTAL PROTEIN: 5.6 g/dL — AB (ref 6.5–8.1)

## 2015-08-31 LAB — CBC
HCT: 30.4 % — ABNORMAL LOW (ref 39.0–52.0)
HEMOGLOBIN: 10.2 g/dL — AB (ref 13.0–17.0)
MCH: 30.1 pg (ref 26.0–34.0)
MCHC: 33.6 g/dL (ref 30.0–36.0)
MCV: 89.7 fL (ref 78.0–100.0)
PLATELETS: 164 10*3/uL (ref 150–400)
RBC: 3.39 MIL/uL — ABNORMAL LOW (ref 4.22–5.81)
RDW: 13.1 % (ref 11.5–15.5)
WBC: 9.4 10*3/uL (ref 4.0–10.5)

## 2015-08-31 LAB — GLUCOSE, CAPILLARY: Glucose-Capillary: 80 mg/dL (ref 65–99)

## 2015-08-31 MED ORDER — LOSARTAN POTASSIUM 50 MG PO TABS: 50.0000 mg | ORAL_TABLET | Freq: Two times a day (BID) | ORAL | Status: DC

## 2015-08-31 MED ORDER — FOLIC ACID 1 MG PO TABS: 1.0000 mg | ORAL_TABLET | Freq: Every day | ORAL | Status: DC

## 2015-08-31 MED ORDER — THIAMINE HCL 100 MG PO TABS: 100.0000 mg | ORAL_TABLET | Freq: Every day | ORAL | Status: DC

## 2015-08-31 MED ORDER — AMLODIPINE BESYLATE 10 MG PO TABS: 5.0000 mg | ORAL_TABLET | Freq: Every day | ORAL | Status: DC

## 2015-08-31 MED ORDER — TRIAMTERENE-HCTZ 37.5-25 MG PO TABS: 1.0000 | ORAL_TABLET | Freq: Every day | ORAL | Status: DC

## 2015-08-31 NOTE — Discharge Summary (Signed)
Physician Discharge Summary  CLAYBORNE DIVIS MRN: [id number] DOB/AGE: [birth year] 75 y.o.  PCP: Mayra Neer, MD   Admit date: 08/27/2015 Discharge date: 08/31/2015  Discharge Diagnoses:     Principal Problem:   ACS (acute coronary syndrome) [facility]) Active Problems:   Essential hypertension   Hyperlipidemia   CAD (coronary artery disease) of artery bypass graft   CKD (chronic kidney disease), stage III   Type 2 diabetes mellitus with vascular disease (HCC)   Hyponatremia   Coronary artery disease involving native coronary artery of native heart with unstable angina pectoris (HCC)   Pain in the chest    Follow-up recommendations Follow-up with PCP in 3-5 days , including all  additional recommended appointments as below Follow-up CBC, CMP in 3-5 days Please monitor patient's electrolytes including sodium closely     Medication List    TAKE these medications        amLODipine 10 MG tablet  Commonly known as:  NORVASC  Take 0.5 tablets (5 mg total) by mouth daily.     aspirin 81 MG tablet  Take 81 mg by mouth daily.     CoQ10 100 MG Caps  Take 100 mg by mouth daily.     ferrous sulfate 325 (65 FE) MG tablet  Take 325 mg by mouth daily with breakfast.     folic acid 1 MG tablet  Commonly known as:  FOLVITE  Take 1 tablet (1 mg total) by mouth daily.     furosemide 40 MG tablet  Commonly known as:  LASIX  Take 40 mg by mouth every morning.     losartan 50 MG tablet  Commonly known as:  COZAAR  Take 1 tablet (50 mg total) by mouth 2 (two) times daily.     metFORMIN 1000 MG tablet  Commonly known as:  GLUCOPHAGE  Take 1,000 mg by mouth 2 (two) times daily with a meal.     metoprolol 100 MG tablet  Commonly known as:  LOPRESSOR  Take 100 mg by mouth 2 (two) times daily.     Niacin CR 1000 MG Tbcr  Take 1 tablet by mouth 2 (two) times daily. Take 1 tab twice a day     nitroGLYCERIN 0.4 MG SL tablet  Commonly known as:  NITROSTAT  Place 0.4 mg under the  tongue every 5 (five) minutes as needed for chest pain.     thiamine 100 MG tablet  Take 1 tablet (100 mg total) by mouth daily.     triamterene-hydrochlorothiazide 37.5-25 MG tablet  Commonly known as:  MAXZIDE-25  Take 1 tablet by mouth daily.     ZETIA 10 MG tablet  Generic drug:  ezetimibe  Take 5 mg by mouth daily. TAKES 1/2 TAB         Discharge Condition: Stable Discharge Instructions Get Medicines reviewed and adjusted: Please take all your medications with you for your next visit with your Primary MD  Please request your Primary MD to go over all hospital tests and procedure/radiological results at the follow up, please ask your Primary MD to get all Hospital records sent to his/her office.  If you experience worsening of your admission symptoms, develop shortness of breath, life threatening emergency, suicidal or homicidal thoughts you must seek medical attention immediately by calling 911 or calling your MD immediately if symptoms less severe.  You must read complete instructions/literature along with all the possible adverse reactions/side effects for all the Medicines you take and that have been prescribed to  you. Take any new Medicines after you have completely understood and accpet all the possible adverse reactions/side effects.   Do not drive when taking Pain medications.   Do not take more than prescribed Pain, Sleep and Anxiety Medications  Special Instructions: If you have smoked or chewed Tobacco in the last 2 yrs please stop smoking, stop any regular Alcohol and or any Recreational drug use.  Wear Seat belts while driving.  Please note  You were cared for by a hospitalist during your hospital stay. Once you are discharged, your primary care physician will handle any further medical issues. Please note that NO REFILLS for any discharge medications will be authorized once you are discharged, as it is imperative that you return to your primary care  physician (or establish a relationship with a primary care physician if you do not have one) for your aftercare needs so that they can reassess your need for medications and monitor your lab values.      Discharge Instructions    Diet - low sodium heart healthy    Complete by:  As directed      Increase activity slowly    Complete by:  As directed            Allergies  Allergen Reactions  . Other Anaphylaxis    ALL BELL PEPPERS  . Statins Other (See Comments)    MYLAGIAS AND LEG NERVE DAMAGE    . Fish Oil Hives      Disposition:    Consults: Cardiology   Significant Diagnostic Studies:  Dg Chest 2 View  08/27/2015  CLINICAL DATA:  75 year old male with chest pain EXAM: CHEST  2 VIEW COMPARISON:  Chest radiograph dated 03/23/2013 FINDINGS: Two views of the chest demonstrate mild emphysematous changes of the lungs. There is no focal consolidation, pleural effusion, or pneumothorax. 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 Stable cardiac silhouette. Median sternotomy wires and CABG vascular clips noted. There is degenerative changes of the shoulders and spine. No acute fracture. IMPRESSION: No active cardiopulmonary disease. Electronically Signed   By: Anner Crete M.D.   On: 08/27/2015 19:59    2-D echo LV EF: 65% - 70%  ------------------------------------------------------------------- Indications: Chest pain 786.51.  ------------------------------------------------------------------- History: PMH: Chronic Kidney Disease. Coronary artery disease. Risk factors: Hypertension. Diabetes mellitus. Dyslipidemia.  ------------------------------------------------------------------- Study Conclusions  - Left ventricle: The cavity size was normal.  Wall thickness was  increased in a pattern of mild LVH. Systolic function was  vigorous. The estimated ejection fraction was in the range of 65%  to 70%. Wall motion was normal; there were no regional wall  motion abnormalities. Doppler parameters are consistent with  abnormal left ventricular relaxation (grade 1 diastolic  dysfunction). The E/e&' ratio is between 8-15, suggesting  indeterminate LV filling pressure. - Mitral valve: Mildly thickened leaflets . There was trivial  regurgitation. - Left atrium: The atrium was mildly dilated. - Right ventricle: The cavity size was mildly dilated. Systolic  function is reduced. - Right atrium: Severely dilated at 28 cm2. - Tricuspid valve: There was moderate regurgitation. - Pulmonic valve: The valve appears to be grossly normal. There was  mild regurgitation. - Pulmonary arteries: PA peak pressure: 41 mm Hg (S). - Inferior vena cava: The vessel was dilated. The respirophasic  diameter changes were blunted (< 50%),  consistent with elevated  central venous pressure.  Impressions:  - LVEF 65-70%, mild LVH, normal wall motion, diastolic dysfunction,  indeterminate LV filling pressure, mild LAE, dilated RV with  reduced systolic function, severe RAE, moderate TR, mild PI, RVSP  41 mmHg, dilated IVC.  Cardiac cath Left Heart Cath and Coronary Angiography    Conclusion     Ost 1st Diag to 1st Diag lesion, 100% stenosed. SVG-DIAG is normal in caliber, and is anatomically normal.  Ost LAD to Prox LAD lesion, 80% stenosed. Prox LAD lesion, 100% stenosed. LIMA-LAD is normal in caliber, and is anatomically normal.  Prox RCA to Mid RCA lesion, 80% stenosed. SVG-dRCA is large and competitive flow minimizes the antegrade flow from the native coronary.  Ost RPDA lesion, 60% stenosed.  The left ventricular systolic function is normal.      Filed Weights   08/29/15 0430 08/30/15 0644 08/31/15 0515  Weight: 80.876 kg (178  lb 4.8 oz) 79.878 kg (176 lb 1.6 oz) 78.971 kg (174 lb 1.6 oz)     Microbiology: No results found for this or any previous visit (from the past 240 hour(s)).     Blood Culture No results found for: SDES, [place], CULT, REPTSTATUS    Labs: Results for orders placed or performed during the hospital encounter of 08/27/15 (from the past 48 hour(s))  Glucose, capillary     Status: Abnormal   Collection Time: 08/29/15 11:22 AM  Result Value Ref Range   Glucose-Capillary 108 (H) 65 - 99 mg/dL  Glucose, capillary     Status: Abnormal   Collection Time: 08/29/15 10:21 PM  Result Value Ref Range   Glucose-Capillary 161 (H) 65 - 99 mg/dL   Comment 1 Notify RN    Comment 2 Document in Chart   Glucose, capillary     Status: None   Collection Time: 08/30/15  6:20 AM  Result Value Ref Range   Glucose-Capillary 97 65 - 99 mg/dL   Comment 1 Notify RN    Comment 2 Document in Chart   Basic metabolic panel     Status: Abnormal   Collection Time: 08/30/15 10:00 AM  Result Value Ref Range   Sodium 127 (L) 135 - 145 mmol/L    Comment: DELTA CHECK NOTED   Potassium 3.8 3.5 - 5.1 mmol/L   Chloride 99 (L) 101 - 111 mmol/L   CO2 22 22 - 32 mmol/L   Glucose, Bld 195 (H) 65 - 99 mg/dL   BUN 5 (L) 6 - 20 mg/dL   Creatinine, Ser 0.93 0.61 - 1.24 mg/dL   Calcium 7.6 (L) 8.9 - 10.3 mg/dL   GFR calc non Af Amer >60 >60 mL/min   GFR calc Af Amer >60 >60 mL/min    Comment: (NOTE) The eGFR has been calculated using the CKD EPI equation. This calculation has not been validated in all clinical situations. eGFR's persistently <60 mL/min signify possible Chronic Kidney Disease.    Anion gap 6 5 - 15  Glucose, capillary     Status: Abnormal   Collection Time: 08/30/15 12:28 PM  Result Value Ref Range   Glucose-Capillary 123 (H) 65 - 99 mg/dL   Comment 1 Notify RN    Comment 2 Document in Chart   Glucose, capillary     Status: Abnormal   Collection Time: 08/30/15  4:15 PM  Result Value Ref  Range   Glucose-Capillary 116 (H) 65 - 99 mg/dL   Comment 1 Notify RN    Comment  2 Document in Chart   Glucose, capillary     Status: Abnormal   Collection Time: 08/30/15 10:06 PM  Result Value Ref Range   Glucose-Capillary 116 (H) 65 - 99 mg/dL  Comprehensive metabolic panel     Status: Abnormal   Collection Time: 08/31/15  4:12 AM  Result Value Ref Range   Sodium 130 (L) 135 - 145 mmol/L   Potassium 3.8 3.5 - 5.1 mmol/L   Chloride 100 (L) 101 - 111 mmol/L   CO2 22 22 - 32 mmol/L   Glucose, Bld 94 65 - 99 mg/dL   BUN 8 6 - 20 mg/dL   Creatinine, Ser 1.06 0.61 - 1.24 mg/dL   Calcium 8.2 (L) 8.9 - 10.3 mg/dL   Total Protein 5.6 (L) 6.5 - 8.1 g/dL   Albumin 2.2 (L) 3.5 - 5.0 g/dL   AST 29 15 - 41 U/L   ALT 18 17 - 63 U/L   Alkaline Phosphatase 103 38 - 126 U/L   Total Bilirubin 0.7 0.3 - 1.2 mg/dL   GFR calc non Af Amer >60 >60 mL/min   GFR calc Af Amer >60 >60 mL/min    Comment: (NOTE) The eGFR has been calculated using the CKD EPI equation. This calculation has not been validated in all clinical situations. eGFR's persistently <60 mL/min signify possible Chronic Kidney Disease.    Anion gap 8 5 - 15  CBC     Status: Abnormal   Collection Time: 08/31/15  4:12 AM  Result Value Ref Range   WBC 9.4 4.0 - 10.5 K/uL   RBC 3.39 (L) 4.22 - 5.81 MIL/uL   Hemoglobin 10.2 (L) 13.0 - 17.0 g/dL   HCT 30.4 (L) 39.0 - 52.0 %   MCV 89.7 78.0 - 100.0 fL   MCH 30.1 26.0 - 34.0 pg   MCHC 33.6 30.0 - 36.0 g/dL   RDW 13.1 11.5 - 15.5 %   Platelets 164 150 - 400 K/uL  Glucose, capillary     Status: None   Collection Time: 08/31/15  7:42 AM  Result Value Ref Range   Glucose-Capillary 80 65 - 99 mg/dL   Comment 1 Notify RN    Comment 2 Document in Chart      Lipid Panel  No results found for: CHOL, TRIG, HDL, CHOLHDL, VLDL, LDLCALC, LDLDIRECT   No results found for: HGBA1C   Lab Results  Component Value Date   CREATININE 1.06 08/31/2015     HPI : 75 year old male with  history of coronary artery disease, hypertension, hyperlipidemia, diabetes mellitus type 2, peripheral vascular disease, obesity. He had coronary artery bypass grafting x3 in 2005 by Dr. Nils Pyle.  He presents with chest pain(epigastric) which started yesterday after getting off his tractor. He was giving his grandchild a ride. He went into the house and sat down. After about 15 mins he vomited. Pain was 7/10 and radiated through to his back. He ate about 2.5 hours before onset. One SL NTG did nothing. He received a GI cocktail at 2345hrs. Current pain level 1/10. The patient denies fever, shortness of breath, diaphoresis, orthopnea, dizziness, PND, cough, congestion, abdominal pain, hematochezia, melena, claudication. He had some issues with LEE about 2 months ago and held amlodipine for a while and it improved.    HOSPITAL COURSE:  ACS (acute coronary syndrome)- CAD (coronary artery disease) of artery bypass graft - mild Troponin elevation, EKG unrevealing, - Heparin/ Nitro per cardiology- cath 3/31 diffuse dz of native vessel Patent  grafts , results as above Cardiology recommends continued medical Rx WIll titrate as bp tolerates , restarted on Norvasc, [previously caused him to have  lower extremity edema] Cardiology recommends continued monitoring his blood pressure may need up titrating of his medications    Hyponatremia, baseline around 128 -134 Sodium 130 prior to discharge, currently on HCTZ which may be contributing, can be switched to another antihypertensive if needed by PCP   Essential hypertension uncontrolled, Currently on Norvasc, Cozaar, metoprolol, Maxzide,   CKD (chronic kidney disease), stage III - stable  HLD - allergic to statin, on Zetia and Niacin   Type 2 diabetes mellitus with vascular disease (HCC) - hold Metformin, okay to resume   Alcohol use - started on CIWA protocol on admission, no signs of withdrawal Continue  folic acid and thiamine     Discharge Exam:   Blood pressure 152/51, pulse 59, temperature 98.5 F (36.9 C), temperature source Oral, resp. rate 18, height '5\' 8"'$  (1.727 m), weight 78.971 kg (174 lb 1.6 oz), SpO2 98 %.  General: No acute respiratory distress Lungs: Clear to auscultation bilaterally without wheezes or crackles Cardiovascular: Regular rate and rhythm without murmur gallop or rub normal S1 and S2 Abdomen: Nontender, nondistended, soft, bowel sounds positive, no rebound, no ascites, no appreciable mass Extremities: No significant cyanosis, clubbing, or edema bilateral lower extremities    Follow-up Information    Follow up with SHAW,KIMBERLEE, MD. Schedule an appointment as soon as possible for a visit in 3 days.   Specialty:  Family Medicine   Contact information:   [address] [place] [address] [phone]       Signed: Reyne Dumas 08/31/2015, 10:32 AM        Time spent >45 mins

## 2015-08-31 NOTE — Progress Notes (Signed)
Pt discharged home, IV and tele removed. Pt given discharge instructions.

## 2015-08-31 NOTE — Progress Notes (Signed)
OK to discharge home  Pt has appt with H SMith in 1 month .

## 2015-09-01 ENCOUNTER — Encounter (HOSPITAL_COMMUNITY): Payer: Self-pay | Admitting: Cardiology

## 2015-09-02 ENCOUNTER — Encounter: Payer: Self-pay | Admitting: Cardiology

## 2015-09-02 ENCOUNTER — Ambulatory Visit (INDEPENDENT_AMBULATORY_CARE_PROVIDER_SITE_OTHER): Payer: Federal, State, Local not specified - PPO | Admitting: Cardiology

## 2015-09-02 VITALS — BP 140/62 | HR 64 | Ht 68.0 in | Wt 179.1 lb

## 2015-09-02 DIAGNOSIS — I2510 Atherosclerotic heart disease of native coronary artery without angina pectoris: Secondary | ICD-10-CM | POA: Diagnosis not present

## 2015-09-02 NOTE — Patient Instructions (Signed)
Medication Instructions:  None  Labwork: None  Testing/Procedures: none  Follow-Up: Keep your follow up appointment with Dr. Katrinka Blazing as previously scheduled.  Any Other Special Instructions Will Be Listed Below (If Applicable).     If you need a refill on your cardiac medications before your next appointment, please call your pharmacy.

## 2015-09-02 NOTE — Progress Notes (Signed)
09/02/2015 [name]   01/08/1941  [id number]  Primary Physician Lupita Raider, MD Primary Cardiologist: Dr. Katrinka Blazing   Reason for Visit/CC: [facility] F/u for CP/ CAD  HPI:  75 year old male, followed by Dr. Katrinka Blazing, with history of coronary artery disease, hypertension, hyperlipidemia, diabetes mellitus type 2, peripheral vascular disease and obesity. He had coronary artery bypass grafting x3 (LIMA-LAD, SVG-DIAG1, SVG-dRCA, with patent native circumflex-OM1 and OM 2) in 2005 by Dr. Morton Peters.   He recently presented to [facility] with chest pain (epigastric) which after getting off his tractor. He was giving his grandchild a ride. He went into the house and sat down. After about 15 mins he vomited. Pain was 7/10 and radiated through to his back. He ate about 2.5 hours before onset. One SL NTG did nothing.Based on his history, Dr. Katrinka Blazing felt this was concerning for possible unstable angina. He was therefore referred for invasive evaluation with cardiac catheterization for possible acute coronary syndrome/unstable angina. Procedure was performed by Dr. Herbie Baltimore. He was found to have diffuse disease of native vessels but patent grafts. EF was normal at 55-65%. No PCI was required. Continued medical therapy was recommended. Amlodipine was added to his regimen of metoprolol, ASA and losartan. He was discharged home in stable condition on 08/31/15.   He reports to clinic for post hospital f/u. He reports that he has done well. No issues. He denies recurrent CP. No dyspnea. His radical cath site is stable w/o complication. He reports full medication compliance. He has been tolerating amlodipine but has concerns as he had issues with LEE in the past. He has not noticed and significant edema since restarting it.      Current Outpatient Prescriptions  Medication Sig Dispense Refill  . amLODipine (NORVASC) 10 MG tablet Take 0.5 tablets (5 mg total) by mouth daily. 30 tablet 1  . aspirin 81 MG  tablet Take 81 mg by mouth daily.    . Coenzyme Q10 (COQ10) 100 MG CAPS Take 100 mg by mouth daily.    . ferrous sulfate 325 (65 FE) MG tablet Take 325 mg by mouth daily with breakfast.    . folic acid (FOLVITE) 1 MG tablet Take 1 tablet (1 mg total) by mouth daily. 30 tablet 0  . furosemide (LASIX) 40 MG tablet Take 40 mg by mouth every morning.     Marland Kitchen losartan (COZAAR) 50 MG tablet Take 1 tablet (50 mg total) by mouth 2 (two) times daily. 60 tablet 1  . metFORMIN (GLUCOPHAGE) 1000 MG tablet Take 1,000 mg by mouth 2 (two) times daily with a meal.    . metoprolol (LOPRESSOR) 100 MG tablet Take 100 mg by mouth 2 (two) times daily.    . Niacin CR 1000 MG TBCR Take 1 tablet by mouth 2 (two) times daily. Take 1 tab twice a day    . nitroGLYCERIN (NITROSTAT) 0.4 MG SL tablet Place 0.4 mg under the tongue every 5 (five) minutes as needed for chest pain.    Marland Kitchen thiamine 100 MG tablet Take 1 tablet (100 mg total) by mouth daily. 30 tablet 1  . triamterene-hydrochlorothiazide (MAXZIDE-25) 37.5-25 MG tablet Take 1 tablet by mouth daily. 30 tablet 1  . ZETIA 10 MG tablet Take 5 mg by mouth daily. TAKES 1/2 TAB     No current facility-administered medications for this visit.    Allergies  Allergen Reactions  . Other Anaphylaxis    ALL BELL PEPPERS  . Statins Other (See Comments)  MYLAGIAS AND LEG NERVE DAMAGE    . Fish Oil Hives    Social History   Social History  . Marital Status: Married    Spouse Name: N/A  . Number of Children: N/A  . Years of Education: N/A   Occupational History  . Not on file.   Social History Main Topics  . Smoking status: Former Smoker -- 1.50 packs/day for 35 years    Types: Cigarettes    Quit date: 01/08/1994  . Smokeless tobacco: Never Used  . Alcohol Use: 16.8 oz/week    28 Cans of beer per week     Comment: 3-5 beers everyday.  . Drug Use: No  . Sexual Activity: Not Currently   Other Topics Concern  . Not on file   Social History Narrative      Review of Systems: General: negative for chills, fever, night sweats or weight changes.  Cardiovascular: negative for chest pain, dyspnea on exertion, edema, orthopnea, palpitations, paroxysmal nocturnal dyspnea or shortness of breath Dermatological: negative for rash Respiratory: negative for cough or wheezing Urologic: negative for hematuria Abdominal: negative for nausea, vomiting, diarrhea, bright red blood per rectum, melena, or hematemesis Neurologic: negative for visual changes, syncope, or dizziness All other systems reviewed and are otherwise negative except as noted above.    Blood pressure 140/62, pulse 64, height 5\' 8"  (1.727 m), weight 179 lb 1.9 oz (81.248 kg), SpO2 96 %.  General appearance: alert, cooperative and no distress Neck: no carotid bruit and no JVD Lungs: clear to auscultation bilaterally Heart: regular rate and rhythm, S1, S2 normal, no murmur, click, rub or gallop Extremities: no LEE Pulses: 2+ and symmetric Skin: warm and dry Neurologic: Grossly normal  EKG not performed.   ASSESSMENT AND PLAN:   1. CAD: recent cath showed stable coronary anatomy with patient grafts. He denies any recurrent CP with addition of amlodipine. Continue amlodipine along with metoprolol, ASA and ARB.   2. HTN: BP is controlled on current regimen.   3. H/o HLD: patient with known CAD but currently not on statin therapy due to listed history of allergy/intolerance to statins. I failed to discuss other medical  options with patient. He has already left the clinic. He has f/u with Dr. Katrinka Blazing next month in May. Recommend considering addition of PCSK inhibitors for cholesterol. Dr. Katrinka Blazing can have this discussion with him.   PLAN  Keep f/u with Dr. Katrinka Blazing in May  Robbie Lis PA-C 09/02/2015 9:01 AM

## 2015-09-09 ENCOUNTER — Emergency Department (HOSPITAL_COMMUNITY)
Admission: EM | Admit: 2015-09-09 | Discharge: 2015-09-09 | Disposition: A | Discharge status: Home / Self Care | Payer: Federal, State, Local not specified - PPO | Attending: Emergency Medicine | Admitting: Emergency Medicine

## 2015-09-09 ENCOUNTER — Encounter (HOSPITAL_COMMUNITY): Payer: Self-pay | Admitting: *Deleted

## 2015-09-09 DIAGNOSIS — E785 Hyperlipidemia, unspecified: Secondary | ICD-10-CM | POA: Diagnosis not present

## 2015-09-09 DIAGNOSIS — N179 Acute kidney failure, unspecified: Secondary | ICD-10-CM | POA: Insufficient documentation

## 2015-09-09 DIAGNOSIS — Z9889 Other specified postprocedural states: Secondary | ICD-10-CM | POA: Diagnosis not present

## 2015-09-09 DIAGNOSIS — D509 Iron deficiency anemia, unspecified: Secondary | ICD-10-CM | POA: Diagnosis not present

## 2015-09-09 DIAGNOSIS — Z87891 Personal history of nicotine dependence: Secondary | ICD-10-CM | POA: Insufficient documentation

## 2015-09-09 DIAGNOSIS — E871 Hypo-osmolality and hyponatremia: Secondary | ICD-10-CM | POA: Diagnosis not present

## 2015-09-09 DIAGNOSIS — R531 Weakness: Secondary | ICD-10-CM

## 2015-09-09 DIAGNOSIS — Z7982 Long term (current) use of aspirin: Secondary | ICD-10-CM | POA: Insufficient documentation

## 2015-09-09 DIAGNOSIS — Z859 Personal history of malignant neoplasm, unspecified: Secondary | ICD-10-CM | POA: Insufficient documentation

## 2015-09-09 DIAGNOSIS — I10 Essential (primary) hypertension: Secondary | ICD-10-CM | POA: Insufficient documentation

## 2015-09-09 DIAGNOSIS — Z9861 Coronary angioplasty status: Secondary | ICD-10-CM | POA: Insufficient documentation

## 2015-09-09 DIAGNOSIS — M1990 Unspecified osteoarthritis, unspecified site: Secondary | ICD-10-CM | POA: Diagnosis not present

## 2015-09-09 DIAGNOSIS — I252 Old myocardial infarction: Secondary | ICD-10-CM | POA: Insufficient documentation

## 2015-09-09 DIAGNOSIS — Z951 Presence of aortocoronary bypass graft: Secondary | ICD-10-CM | POA: Diagnosis not present

## 2015-09-09 DIAGNOSIS — E119 Type 2 diabetes mellitus without complications: Secondary | ICD-10-CM | POA: Diagnosis not present

## 2015-09-09 DIAGNOSIS — Z87438 Personal history of other diseases of male genital organs: Secondary | ICD-10-CM | POA: Diagnosis not present

## 2015-09-09 DIAGNOSIS — I2510 Atherosclerotic heart disease of native coronary artery without angina pectoris: Secondary | ICD-10-CM | POA: Insufficient documentation

## 2015-09-09 DIAGNOSIS — Z7984 Long term (current) use of oral hypoglycemic drugs: Secondary | ICD-10-CM | POA: Insufficient documentation

## 2015-09-09 DIAGNOSIS — E669 Obesity, unspecified: Secondary | ICD-10-CM | POA: Insufficient documentation

## 2015-09-09 DIAGNOSIS — Z79899 Other long term (current) drug therapy: Secondary | ICD-10-CM | POA: Insufficient documentation

## 2015-09-09 LAB — URINE MICROSCOPIC-ADD ON

## 2015-09-09 LAB — BASIC METABOLIC PANEL
Anion gap: 12 (ref 5–15)
BUN: 29 mg/dL — AB (ref 6–20)
CHLORIDE: 99 mmol/L — AB (ref 101–111)
CO2: 19 mmol/L — AB (ref 22–32)
CREATININE: 2.19 mg/dL — AB (ref 0.61–1.24)
Calcium: 8.3 mg/dL — ABNORMAL LOW (ref 8.9–10.3)
GFR calc Af Amer: 32 mL/min — ABNORMAL LOW (ref >60)
GFR calc non Af Amer: 28 mL/min — ABNORMAL LOW (ref >60)
GLUCOSE: 326 mg/dL — AB (ref 65–99)
Potassium: 4.4 mmol/L (ref 3.5–5.1)
Sodium: 130 mmol/L — ABNORMAL LOW (ref 135–145)

## 2015-09-09 LAB — CBC
HCT: 29.1 % — ABNORMAL LOW (ref 39.0–52.0)
Hemoglobin: 9.8 g/dL — ABNORMAL LOW (ref 13.0–17.0)
MCH: 30.3 pg (ref 26.0–34.0)
MCHC: 33.7 g/dL (ref 30.0–36.0)
MCV: 90.1 fL (ref 78.0–100.0)
PLATELETS: 262 10*3/uL (ref 150–400)
RBC: 3.23 MIL/uL — ABNORMAL LOW (ref 4.22–5.81)
RDW: 13.4 % (ref 11.5–15.5)
WBC: 9.1 10*3/uL (ref 4.0–10.5)

## 2015-09-09 LAB — URINALYSIS, ROUTINE W REFLEX MICROSCOPIC
GLUCOSE, UA: 250 mg/dL — AB
Ketones, ur: NEGATIVE mg/dL
Leukocytes, UA: NEGATIVE
Nitrite: NEGATIVE
PROTEIN: 100 mg/dL — AB
Specific Gravity, Urine: 1.015 (ref 1.005–1.030)
pH: 5.5 (ref 5.0–8.0)

## 2015-09-09 LAB — I-STAT TROPONIN, ED: Troponin i, poc: 0.05 ng/mL (ref 0.00–0.08)

## 2015-09-09 LAB — CBG MONITORING, ED: Glucose-Capillary: 265 mg/dL — ABNORMAL HIGH (ref 65–99)

## 2015-09-09 MED ORDER — SODIUM CHLORIDE 0.9 % IV BOLUS (SEPSIS)
1000.0000 mL | Freq: Once | INTRAVENOUS | Status: AC
  Administered 2015-09-09: 1000 mL via INTRAVENOUS

## 2015-09-09 MED ORDER — SODIUM CHLORIDE 0.9 % IV BOLUS (SEPSIS): 500.0000 mL | Freq: Once | INTRAVENOUS | Status: DC

## 2015-09-09 NOTE — ED Notes (Signed)
Pt sent to ED for eval by Dr. Clelia Croft d/t hypotension and lethargy, pt c/o generalized weakness & fatigue, pt denies CP & SOB, symptom onset x 2 days, pt recently seen for CP with catherization x 2 wks ago, no slurred speech noted, no facial droop noted, A&O x4

## 2015-09-09 NOTE — ED Provider Notes (Signed)
CSN: [id number]     Arrival date & time 09/09/15  1129 History   First MD Initiated Contact with Patient 09/09/15 1507     Chief Complaint  Patient presents with  . Weakness     (Consider location/radiation/quality/duration/timing/severity/associated sxs/prior Treatment) HPI  [name] is a 75 y.o. male with PMH significant for HTN, CAD s/p LHC 08/29/15, HLD, PVD, IDA, DM, and CKD who presents with 2 days of constant, moderate, unchanging generalized fatigue and generalized weakness.  He describes it as "feeling lethargic and lacking energy".  Denies unilateral weakness, fall, syncope, CP, cough, SOB, abdominal pain, N/V/D, urinary symptoms, fever, headache, dizziness, or bloody stools.  He reports normal PO intake.  Endorses medication changes including "half a tab of amlodipine".  He reports he was sent by his PCP, Dr. Clelia Croft, for evaluation of hypotension and lethargy.   He was seen 08/27/15 in the ED for chest pain and admitted for observation.  LHC showed diffuse disease of native vessel with patent grafts.  He has followed up with cardiology 08/31/15, and ha not experienced recurrent chest pain.  Discharged home with medical treatment with Norvasc, Lasix, Cozaar, Lopressor, Maxzide  Past Medical History  Diagnosis Date  . CAD (coronary artery disease) 2005    mLAD ~90%, D1/RI-70, mRCA 70% --> CABG 3: LIMA-LAD, SVG-D1/RI, SVG-dRCA  . HTN (hypertension)   . Hyperlipemia   . Peripheral vascular disease (HCC)   . Benign prostatic hyperplasia   . Iron deficiency anemia   . Alcohol abuse   . Obesity   . Cancer (HCC)     "6 cut off back & arms; several burned off" (08/28/2015)  . PONV (postoperative nausea and vomiting)   . Myocardial infarction [facility]) 1995    s/p PTCA  . Type II diabetes mellitus (HCC)   . Arthritis     "just about qwhere now" (08/28/2015)   Past Surgical History  Procedure Laterality Date  . Carotid endarterectomy    . Coronary artery bypass graft  2005   CABG 3: LIMA-LAD, SVG-D1/RI, SVG-dRCA (Dr.VanTrigt)  . Hernia repair    . Inguinal hernia repair Right   . Umbilical hernia repair    . Abdominal hernia repair    . Knee arthroscopy Left   . Knee cartilage surgery Right 1960s    "took cartilage out"  . Skin cancer destruction      "6 cut off" (08/28/2015)  . Shoulder arthroscopy w/ rotator cuff repair Left   . Coronary angioplasty with stent placement  12/1993; 04/1994  . Cardiac catheterization  2005    mLAD ~90%, D1/RI-70, mRCA 70%   . Eye surgery Left 1968    "dug hot steel out that was imbedded"   . Cardiac catheterization N/A 08/29/2015    Procedure: Left Heart Cath and Coronary Angiography;  Surgeon: Marykay Lex, MD;  Location: [facility] INVASIVE CV LAB;  Service: Cardiovascular;  Laterality: N/A;   Family History  Problem Relation Age of Onset  . Heart attack Mother   . CAD Father    Social History  Substance Use Topics  . Smoking status: Former Smoker -- 1.50 packs/day for 35 years    Types: Cigarettes    Quit date: 01/08/1994  . Smokeless tobacco: Never Used  . Alcohol Use: 16.8 oz/week    28 Cans of beer per week     Comment: 3-5 beers everyday.    Review of Systems All other systems negative unless otherwise stated in HPI    Allergies  Other; Statins; Fish oil; and Norvasc  Home Medications   Prior to Admission medications   Medication Sig Start Date End Date Taking? Authorizing Provider  amLODipine (NORVASC) 10 MG tablet Take 0.5 tablets (5 mg total) by mouth daily. 08/31/15  Yes Richarda Overlie, MD  aspirin 81 MG tablet Take 81 mg by mouth daily.   Yes Historical Provider, MD  Coenzyme Q10 (COQ10) 100 MG CAPS Take 100 mg by mouth daily.   Yes Historical Provider, MD  ferrous sulfate 325 (65 FE) MG tablet Take 325 mg by mouth daily with breakfast.   Yes Historical Provider, MD  folic acid (FOLVITE) 1 MG tablet Take 1 tablet (1 mg total) by mouth daily. 08/31/15  Yes Richarda Overlie, MD  furosemide (LASIX) 40 MG tablet  Take 20 mg by mouth daily.    Yes Historical Provider, MD  losartan (COZAAR) 50 MG tablet Take 1 tablet (50 mg total) by mouth 2 (two) times daily. 08/31/15  Yes Richarda Overlie, MD  metFORMIN (GLUCOPHAGE) 1000 MG tablet Take 1,000 mg by mouth 2 (two) times daily with a meal.   Yes Historical Provider, MD  metoprolol (LOPRESSOR) 100 MG tablet Take 100 mg by mouth 2 (two) times daily.   Yes Historical Provider, MD  Niacin CR 1000 MG TBCR Take 1 tablet by mouth 2 (two) times daily. Take 1 tab twice a day   Yes Historical Provider, MD  nitroGLYCERIN (NITROSTAT) 0.4 MG SL tablet Place 0.4 mg under the tongue every 5 (five) minutes as needed for chest pain.   Yes Historical Provider, MD  thiamine 100 MG tablet Take 1 tablet (100 mg total) by mouth daily. 08/31/15  Yes Richarda Overlie, MD  ZETIA 10 MG tablet Take 5 mg by mouth daily. TAKES 1/2 TAB 07/30/13  Yes Historical Provider, MD  triamterene-hydrochlorothiazide (MAXZIDE-25) 37.5-25 MG tablet Take 1 tablet by mouth daily. Patient not taking: Reported on 09/09/2015 08/31/15   Richarda Overlie, MD   BP 164/58 mmHg  Pulse 71  Temp(Src) 98.2 F (36.8 C) (Oral)  Resp 16  SpO2 97% Physical Exam  Constitutional: He is oriented to person, place, and time. He appears well-developed and well-nourished.  Non-toxic appearance. He does not have a sickly appearance. He does not appear ill.  HENT:  Head: Normocephalic and atraumatic.  Mouth/Throat: Oropharynx is clear and moist.  Eyes: Conjunctivae are normal. Pupils are equal, round, and reactive to light.  Neck: Normal range of motion. Neck supple.  Cardiovascular: Normal rate, regular rhythm and normal heart sounds.   No murmur heard. Pulmonary/Chest: Effort normal and breath sounds normal. No accessory muscle usage or stridor. No respiratory distress. He has no wheezes. He has no rhonchi. He has no rales.  Abdominal: Soft. Bowel sounds are normal. He exhibits no distension. There is no tenderness.  Musculoskeletal:  Normal range of motion.  Lymphadenopathy:    He has no cervical adenopathy.  Neurological: He is alert and oriented to person, place, and time. He has normal strength. No cranial nerve deficit or sensory deficit.  Speech clear without dysarthria. No facial droop.   Skin: Skin is warm and dry.  Psychiatric: He has a normal mood and affect. His behavior is normal.    ED Course  Procedures (including critical care time) Labs Review Labs Reviewed  BASIC METABOLIC PANEL - Abnormal; Notable for the following:    Sodium 130 (*)    Chloride 99 (*)    CO2 19 (*)    Glucose, Bld 326 (*)  BUN 29 (*)    Creatinine, Ser 2.19 (*)    Calcium 8.3 (*)    GFR calc non Af Amer 28 (*)    GFR calc Af Amer 32 (*)    All other components within normal limits  CBC - Abnormal; Notable for the following:    RBC 3.23 (*)    Hemoglobin 9.8 (*)    HCT 29.1 (*)    All other components within normal limits  URINALYSIS, ROUTINE W REFLEX MICROSCOPIC (NOT AT [facility]) - Abnormal; Notable for the following:    Color, Urine AMBER (*)    Glucose, UA 250 (*)    Hgb urine dipstick SMALL (*)    Bilirubin Urine SMALL (*)    Protein, ur 100 (*)    All other components within normal limits  URINE MICROSCOPIC-ADD ON - Abnormal; Notable for the following:    Squamous Epithelial / LPF 0-5 (*)    Bacteria, UA FEW (*)    Casts HYALINE CASTS (*)    All other components within normal limits  CBG MONITORING, ED - Abnormal; Notable for the following:    Glucose-Capillary 265 (*)    All other components within normal limits  CBG MONITORING, ED  I-STAT TROPOININ, ED    Imaging Review No results found. I have personally reviewed and evaluated these images and lab results as part of my medical decision-making.   EKG Interpretation   Date/Time:  Tuesday September 09 2015 11:39:33 EDT Ventricular Rate:  65 PR Interval:  174 QRS Duration: 104 QT Interval:  414 QTC Calculation: 430 R Axis:   83 Text Interpretation:   Sinus rhythm with Premature atrial complexes with  Abberant conduction Cannot rule out Inferior infarct , age undetermined  Abnormal ECG Aside from multiple PACs, no significantly changed from prior  EKG  Confirmed by LIU MD, DANA [phone]) on 09/09/2015 4:23:53 PM      MDM   Final diagnoses:  Generalized weakness  AKI (acute kidney injury) (HCC)   Patient presents with generalized weakness and fatigue x 2 days.  Denies CP, cough, SOB, N/V/D, bloody stools, abdominal pain, or urinary symptoms.  VSS, NAD.  On exam, patient appears well, non-toxic or ill appearing.  Heart RRR, lungs CTAB, abdomen soft and benign. Normal neuro, gait normal.    -UA remarkable for small hgb and proteinuria with hyaline casts.  Cr 2.19, BUN 29.  This is elevated from baseline of 0.9-1.1.  I suspect AKI likely secondary to recent IV contrast with cardiac cath.  Patient given IVF. -Sodium 130, K 4.4, glucose 326, no anion gap.  Urine without ketones. No evidence of DKA. -Hgb 9.8, baseline, no leukocytosis.  Urine with few bacteria, no leukocytes. Doubt infectious etiology or anemia.  -Troponin 0.05, EKG without acute abnormalities.  No chest pain, SOB.  Recent cath showed diffuse disease with patent grafts.  Doubt ACS.  Evaluation does not show pathology requiring ongoing emergent intervention or admission. Pt is hemodynamically stable and mentating appropriately. Discussed findings/results and plan with patient/guardian, who agrees with plan. All questions answered. Follow up PCP in 2 days for Cr recheck.  Return precautions discussed and outpatient follow up given.   Case has been discussed with and seen by Dr. Verdie Mosher who agrees with the above plan for discharge.     Cheri Fowler, PA-C 09/09/15 1945  Lavera Guise, MD 09/10/15 1111  Lavera Guise, MD 09/10/15 1131

## 2015-09-09 NOTE — Discharge Instructions (Signed)
Labs today showed an elevation in your Creatinine (2.1) which measures your kidney function.  You were given IV fluids.  Please drink plenty of fluids at home and follow up with Dr. Clelia Croft in 2 days to recheck your creatinine and your sodium.    Acute Kidney Injury Acute kidney injury is any condition in which there is sudden (acute) damage to the kidneys. Acute kidney injury was previously known as acute kidney failure or acute renal failure. The kidneys are two organs that lie on either side of the spine between the middle of the back and the front of the abdomen. The kidneys:  Remove wastes and extra water from the blood.   Produce important hormones. These help keep bones strong, regulate blood pressure, and help create red blood cells.   Balance the fluids and chemicals in the blood and tissues. A small amount of kidney damage may not cause problems, but a large amount of damage may make it difficult or impossible for the kidneys to work the way they should. Acute kidney injury may develop into long-lasting (chronic) kidney disease. It may also develop into a life-threatening disease called end-stage kidney disease. Acute kidney injury can get worse very quickly, so it should be treated right away. Early treatment may prevent other kidney diseases from developing. CAUSES   A problem with blood flow to the kidneys. This may be caused by:   Blood loss.   Heart disease.   Severe burns.   Liver disease.  Direct damage to the kidneys. This may be caused by:  Some medicines.   A kidney infection.   Poisoning or consuming toxic substances.   A surgical wound.   A blow to the kidney area.   A problem with urine flow. This may be caused by:   Cancer.   Kidney stones.   An enlarged prostate. SIGNS AND SYMPTOMS   Swelling (edema) of the legs, ankles, or feet.   Tiredness (lethargy).   Nausea or vomiting.   Confusion.   Problems with urination, such as:    Painful or burning feeling during urination.   Decreased urine production.   Frequent accidents in children who are potty trained.   Bloody urine.   Muscle twitches and cramps.   Shortness of breath.   Seizures.   Chest pain or pressure. Sometimes, no symptoms are present. DIAGNOSIS Acute kidney injury may be detected and diagnosed by tests, including blood, urine, imaging, or kidney biopsy tests.  TREATMENT Treatment of acute kidney injury varies depending on the cause and severity of the kidney damage. In mild cases, no treatment may be needed. The kidneys may heal on their own. If acute kidney injury is more severe, your health care provider will treat the cause of the kidney damage, help the kidneys heal, and prevent complications from occurring. Severe cases may require a procedure to remove toxic wastes from the body (dialysis) or surgery to repair kidney damage. Surgery may involve:   Repair of a torn kidney.   Removal of an obstruction. HOME CARE INSTRUCTIONS  Follow your prescribed diet.  Take medicines only as directed by your health care provider.  Do not take any new medicines (prescription, over-the-counter, or nutritional supplements) unless approved by your health care provider. Many medicines can worsen your kidney damage or may need to have the dose adjusted.   Keep all follow-up visits as directed by your health care provider. This is important.  Observe your condition to make sure you are healing as  expected. SEEK IMMEDIATE MEDICAL CARE IF:  You are feeling ill or have severe pain in the back or side.   Your symptoms return or you have new symptoms.  You have any symptoms of end-stage kidney disease. These include:   Persistent itchiness.   Loss of appetite.   Headaches.   Abnormally dark or light skin.  Numbness in the hands or feet.   Easy bruising.   Frequent hiccups.   Menstruation stops.   You have a  fever.  You have increased urine production.  You have pain or bleeding when urinating. MAKE SURE YOU:   Understand these instructions.  Will watch your condition.  Will get help right away if you are not doing well or get worse.   This information is not intended to replace advice given to you by your health care provider. Make sure you discuss any questions you have with your health care provider.   Document Released: 11/30/2010 Document Revised: 06/07/2014 Document Reviewed: 01/14/2012 Elsevier Interactive Patient Education [employer]

## 2015-10-10 ENCOUNTER — Encounter: Payer: Self-pay | Admitting: Interventional Cardiology

## 2015-10-10 ENCOUNTER — Ambulatory Visit (INDEPENDENT_AMBULATORY_CARE_PROVIDER_SITE_OTHER): Payer: Federal, State, Local not specified - PPO | Admitting: Interventional Cardiology

## 2015-10-10 VITALS — BP 148/60 | HR 56 | Ht 68.0 in | Wt 178.1 lb

## 2015-10-10 DIAGNOSIS — N183 Chronic kidney disease, stage 3 unspecified: Secondary | ICD-10-CM

## 2015-10-10 DIAGNOSIS — I25810 Atherosclerosis of coronary artery bypass graft(s) without angina pectoris: Secondary | ICD-10-CM

## 2015-10-10 DIAGNOSIS — N179 Acute kidney failure, unspecified: Secondary | ICD-10-CM

## 2015-10-10 DIAGNOSIS — I10 Essential (primary) hypertension: Secondary | ICD-10-CM

## 2015-10-10 DIAGNOSIS — E785 Hyperlipidemia, unspecified: Secondary | ICD-10-CM | POA: Diagnosis not present

## 2015-10-10 DIAGNOSIS — E1159 Type 2 diabetes mellitus with other circulatory complications: Secondary | ICD-10-CM | POA: Diagnosis not present
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
## 2015-10-10 NOTE — Patient Instructions (Signed)
Your physician recommends that you continue on your current medications as directed. Please refer to the Current Medication list given to you today. Your physician recommends that you return for lab work in: 1 month (BMET)  Your physician wants you to follow-up in: 4-6 months with Dr. Katrinka Blazing.  You will receive a reminder letter in the mail two months in advance. If you don't receive a letter, please call our office to schedule the follow-up appointment.

## 2015-11-10 ENCOUNTER — Other Ambulatory Visit (INDEPENDENT_AMBULATORY_CARE_PROVIDER_SITE_OTHER): Payer: Federal, State, Local not specified - PPO | Admitting: *Deleted

## 2015-11-10 DIAGNOSIS — E1159 Type 2 diabetes mellitus with other circulatory complications: Secondary | ICD-10-CM

## 2015-11-10 DIAGNOSIS — I10 Essential (primary) hypertension: Secondary | ICD-10-CM

## 2015-11-10 DIAGNOSIS — E785 Hyperlipidemia, unspecified: Secondary | ICD-10-CM

## 2015-11-10 LAB — BASIC METABOLIC PANEL
BUN: 25 mg/dL (ref 7–25)
CO2: 21 mmol/L (ref 20–31)
Calcium: 8.6 mg/dL (ref 8.6–10.3)
Chloride: 95 mmol/L — ABNORMAL LOW (ref 98–110)
Creat: 1.31 mg/dL — ABNORMAL HIGH (ref 0.70–1.18)
GLUCOSE: 86 mg/dL (ref 65–99)
POTASSIUM: 4.6 mmol/L (ref 3.5–5.3)
SODIUM: 126 mmol/L — AB (ref 135–146)

## 2016-01-06 ENCOUNTER — Ambulatory Visit
Admission: RE | Admit: 2016-01-06 | Discharge: 2016-01-06 | Disposition: A | Discharge status: Home / Self Care | Payer: Federal, State, Local not specified - PPO | Source: Ambulatory Visit | Attending: Family Medicine | Admitting: Family Medicine

## 2016-01-06 ENCOUNTER — Other Ambulatory Visit: Payer: Self-pay | Admitting: Family Medicine

## 2016-01-06 DIAGNOSIS — R52 Pain, unspecified: Secondary | ICD-10-CM

## 2016-01-07 ENCOUNTER — Encounter: Payer: Self-pay | Admitting: Interventional Cardiology

## 2016-02-24 ENCOUNTER — Encounter: Payer: Self-pay | Admitting: Interventional Cardiology

## 2016-02-24 ENCOUNTER — Ambulatory Visit (INDEPENDENT_AMBULATORY_CARE_PROVIDER_SITE_OTHER): Payer: Federal, State, Local not specified - PPO | Admitting: Interventional Cardiology

## 2016-02-24 ENCOUNTER — Encounter (INDEPENDENT_AMBULATORY_CARE_PROVIDER_SITE_OTHER): Payer: Self-pay

## 2016-02-24 VITALS — BP 168/72 | HR 40 | Ht 68.0 in | Wt 177.0 lb

## 2016-02-24 DIAGNOSIS — E785 Hyperlipidemia, unspecified: Secondary | ICD-10-CM | POA: Diagnosis not present

## 2016-02-24 DIAGNOSIS — E1159 Type 2 diabetes mellitus with other circulatory complications: Secondary | ICD-10-CM | POA: Diagnosis not present

## 2016-02-24 DIAGNOSIS — I10 Essential (primary) hypertension: Secondary | ICD-10-CM

## 2016-02-24 DIAGNOSIS — I739 Peripheral vascular disease, unspecified: Secondary | ICD-10-CM

## 2016-02-24 DIAGNOSIS — I25810 Atherosclerosis of coronary artery bypass graft(s) without angina pectoris: Secondary | ICD-10-CM | POA: Diagnosis not present
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
## 2016-02-24 NOTE — Patient Instructions (Signed)
Medication Instructions:  Your physician recommends that you continue on your current medications as directed. Please refer to the Current Medication list given to you today.   Labwork: None ordered  Testing/Procedures: Your physician has requested that you have a lower extremity arterial exercise duplex. During this test, exercise and ultrasound are used to evaluate arterial blood flow in the legs. Allow one hour for this exam. There are no restrictions or special instructions.   Follow-Up: Your physician wants you to follow-up in: 1 year with Dr.Smith You will receive a reminder letter in the mail two months in advance. If you don't receive a letter, please call our office to schedule the follow-up appointment.   Any Other Special Instructions Will Be Listed Below (If Applicable).     If you need a refill on your cardiac medications before your next appointment, please call your pharmacy.   

## 2016-02-25 ENCOUNTER — Other Ambulatory Visit: Payer: Self-pay | Admitting: Interventional Cardiology

## 2016-02-25 DIAGNOSIS — I739 Peripheral vascular disease, unspecified: Secondary | ICD-10-CM

## 2016-03-01 ENCOUNTER — Ambulatory Visit (HOSPITAL_COMMUNITY)
Admission: RE | Admit: 2016-03-01 | Discharge: 2016-03-01 | Disposition: A | Discharge status: Home / Self Care | Payer: Federal, State, Local not specified - PPO | Source: Ambulatory Visit | Attending: Cardiovascular Disease | Admitting: Cardiovascular Disease

## 2016-03-01 DIAGNOSIS — I739 Peripheral vascular disease, unspecified: Secondary | ICD-10-CM | POA: Diagnosis not present

## 2016-03-26 ENCOUNTER — Emergency Department (HOSPITAL_COMMUNITY)
Admission: EM | Admit: 2016-03-26 | Discharge: 2016-03-26 | Disposition: A | Discharge status: Home / Self Care | Payer: Federal, State, Local not specified - PPO | Attending: Emergency Medicine | Admitting: Emergency Medicine

## 2016-03-26 ENCOUNTER — Telehealth: Payer: Self-pay | Admitting: Interventional Cardiology

## 2016-03-26 ENCOUNTER — Encounter (HOSPITAL_COMMUNITY): Payer: Self-pay | Admitting: Emergency Medicine

## 2016-03-26 ENCOUNTER — Other Ambulatory Visit: Payer: Federal, State, Local not specified - PPO | Admitting: *Deleted

## 2016-03-26 ENCOUNTER — Emergency Department (HOSPITAL_COMMUNITY): Payer: Federal, State, Local not specified - PPO

## 2016-03-26 DIAGNOSIS — N183 Chronic kidney disease, stage 3 (moderate): Secondary | ICD-10-CM | POA: Insufficient documentation

## 2016-03-26 DIAGNOSIS — I130 Hypertensive heart and chronic kidney disease with heart failure and stage 1 through stage 4 chronic kidney disease, or unspecified chronic kidney disease: Secondary | ICD-10-CM | POA: Diagnosis not present

## 2016-03-26 DIAGNOSIS — Z951 Presence of aortocoronary bypass graft: Secondary | ICD-10-CM | POA: Insufficient documentation

## 2016-03-26 DIAGNOSIS — I252 Old myocardial infarction: Secondary | ICD-10-CM | POA: Insufficient documentation

## 2016-03-26 DIAGNOSIS — D649 Anemia, unspecified: Secondary | ICD-10-CM | POA: Diagnosis not present

## 2016-03-26 DIAGNOSIS — I509 Heart failure, unspecified: Secondary | ICD-10-CM | POA: Diagnosis not present

## 2016-03-26 DIAGNOSIS — M79605 Pain in left leg: Secondary | ICD-10-CM

## 2016-03-26 DIAGNOSIS — Z7984 Long term (current) use of oral hypoglycemic drugs: Secondary | ICD-10-CM | POA: Diagnosis not present

## 2016-03-26 DIAGNOSIS — E1122 Type 2 diabetes mellitus with diabetic chronic kidney disease: Secondary | ICD-10-CM | POA: Diagnosis not present

## 2016-03-26 DIAGNOSIS — I2510 Atherosclerotic heart disease of native coronary artery without angina pectoris: Secondary | ICD-10-CM | POA: Insufficient documentation

## 2016-03-26 DIAGNOSIS — R609 Edema, unspecified: Secondary | ICD-10-CM

## 2016-03-26 DIAGNOSIS — R05 Cough: Secondary | ICD-10-CM | POA: Diagnosis not present

## 2016-03-26 DIAGNOSIS — Z87891 Personal history of nicotine dependence: Secondary | ICD-10-CM | POA: Diagnosis not present

## 2016-03-26 DIAGNOSIS — Z79899 Other long term (current) drug therapy: Secondary | ICD-10-CM | POA: Diagnosis not present

## 2016-03-26 DIAGNOSIS — Z7982 Long term (current) use of aspirin: Secondary | ICD-10-CM | POA: Insufficient documentation

## 2016-03-26 DIAGNOSIS — R600 Localized edema: Secondary | ICD-10-CM

## 2016-03-26 DIAGNOSIS — Z85828 Personal history of other malignant neoplasm of skin: Secondary | ICD-10-CM | POA: Insufficient documentation

## 2016-03-26 DIAGNOSIS — I501 Left ventricular failure: Secondary | ICD-10-CM

## 2016-03-26 DIAGNOSIS — M7989 Other specified soft tissue disorders: Secondary | ICD-10-CM | POA: Diagnosis present

## 2016-03-26 DIAGNOSIS — M79604 Pain in right leg: Secondary | ICD-10-CM

## 2016-03-26 LAB — CBC WITH DIFFERENTIAL/PLATELET
Basophils Absolute: 0 K/uL (ref 0.0–0.1)
Basophils Relative: 0 %
Eosinophils Absolute: 0.6 K/uL (ref 0.0–0.7)
Eosinophils Relative: 8 %
HCT: 31.3 % — ABNORMAL LOW (ref 39.0–52.0)
Hemoglobin: 10.9 g/dL — ABNORMAL LOW (ref 13.0–17.0)
Lymphocytes Relative: 20 %
Lymphs Abs: 1.5 K/uL (ref 0.7–4.0)
MCH: 32.1 pg (ref 26.0–34.0)
MCHC: 34.8 g/dL (ref 30.0–36.0)
MCV: 92.1 fL (ref 78.0–100.0)
Monocytes Absolute: 0.7 K/uL (ref 0.1–1.0)
Monocytes Relative: 9 %
Neutro Abs: 4.9 K/uL (ref 1.7–7.7)
Neutrophils Relative %: 63 %
Platelets: 234 K/uL (ref 150–400)
RBC: 3.4 MIL/uL — ABNORMAL LOW (ref 4.22–5.81)
RDW: 13.1 % (ref 11.5–15.5)
WBC: 7.8 K/uL (ref 4.0–10.5)

## 2016-03-26 LAB — CBC
HCT: 30.4 % — ABNORMAL LOW (ref 38.5–50.0)
Hemoglobin: 10.3 g/dL — ABNORMAL LOW (ref 13.2–17.1)
MCH: 32.6 pg (ref 27.0–33.0)
MCHC: 33.9 g/dL (ref 32.0–36.0)
MCV: 96.2 fL (ref 80.0–100.0)
MPV: 9.1 fL (ref 7.5–12.5)
PLATELETS: 231 10*3/uL (ref 140–400)
RBC: 3.16 MIL/uL — ABNORMAL LOW (ref 4.20–5.80)
RDW: 13.9 % (ref 11.0–15.0)
WBC: 7.7 10*3/uL (ref 3.8–10.8)

## 2016-03-26 LAB — HEPATIC FUNCTION PANEL
ALBUMIN: 2.6 g/dL — AB (ref 3.5–5.0)
ALT: 19 U/L (ref 17–63)
AST: 33 U/L (ref 15–41)
Alkaline Phosphatase: 86 U/L (ref 38–126)
BILIRUBIN TOTAL: 0.7 mg/dL (ref 0.3–1.2)
Bilirubin, Direct: <0.1 mg/dL — ABNORMAL LOW (ref 0.1–0.5)
TOTAL PROTEIN: 6.1 g/dL — AB (ref 6.5–8.1)

## 2016-03-26 LAB — BASIC METABOLIC PANEL WITH GFR
Anion gap: 5 (ref 5–15)
BUN: 22 mg/dL — ABNORMAL HIGH (ref 6–20)
CO2: 25 mmol/L (ref 22–32)
Calcium: 8.1 mg/dL — ABNORMAL LOW (ref 8.9–10.3)
Chloride: 100 mmol/L — ABNORMAL LOW (ref 101–111)
Creatinine, Ser: 1.44 mg/dL — ABNORMAL HIGH (ref 0.61–1.24)
GFR calc Af Amer: 53 mL/min — ABNORMAL LOW
GFR calc non Af Amer: 46 mL/min — ABNORMAL LOW
Glucose, Bld: 141 mg/dL — ABNORMAL HIGH (ref 65–99)
Potassium: 4.2 mmol/L (ref 3.5–5.1)
Sodium: 130 mmol/L — ABNORMAL LOW (ref 135–145)

## 2016-03-26 LAB — BRAIN NATRIURETIC PEPTIDE: B Natriuretic Peptide: 1405.5 pg/mL — ABNORMAL HIGH (ref 0.0–100.0)

## 2016-03-26 LAB — TROPONIN I: Troponin I: <0.03 ng/mL

## 2016-03-26 LAB — D-DIMER, QUANTITATIVE (NOT AT ARMC): D DIMER QUANT: 1.02 ug{FEU}/mL — AB (ref 0.00–0.50)

## 2016-03-26 MED ORDER — IOPAMIDOL (ISOVUE-370) INJECTION 76%
100.0000 mL | Freq: Once | INTRAVENOUS | Status: AC | PRN
  Administered 2016-03-26: 100 mL via INTRAVENOUS

## 2016-03-26 NOTE — Discharge Instructions (Signed)
Take Lasix (furosemide) 40 mg upon arrival home tonight. Then take 80 mg tomorrow and 80 mg on Sunday, 03/28/2016. Call Dr. Katrinka Blazing on Monday, 03/29/2016 to arrange follow-up appointment. Come to [facility] tomorrow to get your outpatient studies of your legs to check for blood clots. Your blood pressure should be rechecked at Dr. Michaelle Copas office within a week. Today's was elevated at 209/93

## 2016-03-26 NOTE — Telephone Encounter (Signed)
Please get CMET, urinalysis, CBC, and D-dimer Increase furosemide to 80 mg daily for two days to see if it helps. Schedule bilat LE Venous doppler to r/o DVT. May need to see APP Early next week.

## 2016-03-26 NOTE — ED Provider Notes (Addendum)
[facility]-EMERGENCY DEPT Provider Note   CSN: [id number] Arrival date & time: 03/26/16  1737     History   Chief Complaint Chief Complaint  Patient presents with  . Leg Swelling  . Critical lab    D-Dimer 1.02 from PCP    HPI [name] is a 75 y.o. male.Patient with bilateral leg swelling for the past several months and shortness of breath for the past several months, becoming worse over the past 2 weeks. Patient had recent outpatient lab work which showed elevated d-dimer, was suggested by Dr. Katrinka Blazing via telephone to have outpatient workup to include bilateral Doppler studies of the legs to rule out DVT as well as to increase Lasix. Other associated symptoms include shortness of breath worse with lying supine improved with sitting up. He denies any chest pain, cough or fever. No other associated symptoms  HPI  Past Medical History:  Diagnosis Date  . Alcohol abuse   . Arthritis    "just about qwhere now" (08/28/2015)  . Benign prostatic hyperplasia   . CAD (coronary artery disease) 2005   mLAD ~90%, D1/RI-70, mRCA 70% --> CABG 3: LIMA-LAD, SVG-D1/RI, SVG-dRCA  . HTN (hypertension)   . Hyperlipemia   . Iron deficiency anemia   . Myocardial infarction 1995   s/p PTCA  . Obesity   . Peripheral vascular disease (HCC)   . PONV (postoperative nausea and vomiting)   . Type II diabetes mellitus [facility])     Patient Active Problem List   Diagnosis Date Noted  . Bilateral claudication of lower limb (HCC) 02/24/2016  . Pain in the chest   . Coronary artery disease involving native coronary artery of native heart with unstable angina pectoris (HCC)   . Type 2 diabetes mellitus with vascular disease (HCC) 08/28/2015  . Hyponatremia 08/28/2015  . ACS (acute coronary syndrome) (HCC) 08/28/2015  . CKD (chronic kidney disease), stage III 09/27/2014  . Essential hypertension 09/26/2013  . Hyperlipidemia 09/26/2013  . CAD (coronary artery disease) of artery bypass graft 09/26/2013    . Diabetes mellitus type II, controlled (HCC) 09/26/2013    Past Surgical History:  Procedure Laterality Date  . ABDOMINAL HERNIA REPAIR    . CARDIAC CATHETERIZATION  2005   mLAD ~90%, D1/RI-70, mRCA 70%   . CARDIAC CATHETERIZATION N/A 08/29/2015   Procedure: Left Heart Cath and Coronary Angiography;  Surgeon: Marykay Lex, MD;  Location: [facility] INVASIVE CV LAB;  Service: Cardiovascular;  Laterality: N/A;  . CAROTID ENDARTERECTOMY    . CORONARY ANGIOPLASTY WITH STENT PLACEMENT  12/1993; 04/1994  . CORONARY ARTERY BYPASS GRAFT  2005   CABG 3: LIMA-LAD, SVG-D1/RI, SVG-dRCA (Dr.VanTrigt)  . EYE SURGERY Left 1968   "dug hot steel out that was imbedded"   . HERNIA REPAIR    . INGUINAL HERNIA REPAIR Right   . KNEE ARTHROSCOPY Left   . KNEE CARTILAGE SURGERY Right 1960s   "took cartilage out"  . SHOULDER ARTHROSCOPY W/ ROTATOR CUFF REPAIR Left   . SKIN CANCER DESTRUCTION     "6 cut off" (08/28/2015)  . UMBILICAL HERNIA REPAIR      OB History    No data available       Home Medications    Prior to Admission medications   Medication Sig Start Date End Date Taking? Authorizing Provider  aspirin EC 81 MG tablet Take 81 mg by mouth at bedtime.    Yes Historical Provider, MD  Coenzyme Q10 (COQ10) 100 MG CAPS Take 100 mg by  mouth daily.   Yes Historical Provider, MD  ezetimibe (ZETIA) 10 MG tablet Take 5 mg by mouth daily.   Yes Historical Provider, MD  ferrous sulfate 325 (65 FE) MG tablet Take 325 mg by mouth at bedtime.    Yes Historical Provider, MD  furosemide (LASIX) 40 MG tablet Take 80 mg by mouth daily.    Yes Historical Provider, MD  metFORMIN (GLUCOPHAGE) 1000 MG tablet Take 1,000 mg by mouth 2 (two) times daily with a meal.   Yes Historical Provider, MD  metoprolol (LOPRESSOR) 100 MG tablet Take 100 mg by mouth 2 (two) times daily.   Yes Historical Provider, MD  niacin (NIASPAN) 1000 MG CR tablet Take 1,000 mg by mouth 2 (two) times daily.   Yes Historical Provider, MD   nitroGLYCERIN (NITROSTAT) 0.4 MG SL tablet Place 0.4 mg under the tongue every 5 (five) minutes as needed for chest pain.   Yes Historical Provider, MD    Family History Family History  Problem Relation Age of Onset  . Heart attack Mother   . CAD Father     Social History Social History  Substance Use Topics  . Smoking status: Former Smoker    Packs/day: 1.50    Years: 35.00    Types: Cigarettes    Quit date: 01/08/1994  . Smokeless tobacco: Never Used  . Alcohol use 16.8 oz/week    28 Cans of beer per week     Comment: 3-5 beers everyday.     Allergies   Food; Other; Statins; Fish oil; and Norvasc [amlodipine]   Review of Systems Review of Systems  Constitutional: Negative.   HENT: Negative.   Respiratory: Positive for cough and shortness of breath.   Cardiovascular: Positive for leg swelling.  Gastrointestinal: Negative.   Musculoskeletal: Negative.   Skin: Negative.   Allergic/Immunologic: Positive for immunocompromised state.       Diabetic  Neurological: Negative.   Psychiatric/Behavioral: Negative.   All other systems reviewed and are negative.    Physical Exam Updated Vital Signs BP (!) 211/60 (BP Location: Left Arm)   Pulse (!) 57   Temp 97.9 F (36.6 C) (Oral)   Resp 16   Ht 5\' 8"  (1.727 m)   Wt 190 lb (86.2 kg)   SpO2 95%   BMI 28.89 kg/m   Physical Exam  Constitutional: No distress.  Chronically ill-appearing  HENT:  Head: Normocephalic and atraumatic.  Eyes: Conjunctivae are normal. Pupils are equal, round, and reactive to light.  Neck: Neck supple. JVD present. No tracheal deviation present. No thyromegaly present.  Cardiovascular: Normal rate and regular rhythm.   No murmur heard. Pulmonary/Chest: Effort normal. He has rales.  Rales right base  Abdominal: Soft. Bowel sounds are normal. He exhibits no distension. There is no tenderness.  Musculoskeletal: Normal range of motion. He exhibits edema. He exhibits no tenderness.  2+  pretibial pitting edema bilaterally  Neurological: He is alert. Coordination normal.  Skin: Skin is warm and dry. No rash noted.  Chronic-appearing brawny changes of bilateral lower extremities below the knees  Psychiatric: He has a normal mood and affect.  Nursing note and vitals reviewed.    ED Treatments / Results  Labs (all labs ordered are listed, but only abnormal results are displayed) Labs Reviewed - No data to display  EKG  EKG Interpretation  Date/Time:  Friday March 26 2016 21:06:29 EDT Ventricular Rate:  76 PR Interval:    QRS Duration: 114 QT Interval:  493 QTC Calculation:  417 R Axis:   107 Text Interpretation:  Ventricular bigeminy Incomplete right bundle branch block Abnormal R-wave progression, late transition No significant change since last tracing Confirmed by Ethelda Chick  MD, Hermina Barnard [phone]) on 03/26/2016 9:18:12 PM      Results for orders placed or performed during the hospital encounter of 03/26/16  Brain natriuretic peptide  Result Value Ref Range   B Natriuretic Peptide 1,405.5 (H) 0.0 - 100.0 pg/mL  CBC with Differential/Platelet  Result Value Ref Range   WBC 7.8 4.0 - 10.5 K/uL   RBC 3.40 (L) 4.22 - 5.81 MIL/uL   Hemoglobin 10.9 (L) 13.0 - 17.0 g/dL   HCT 60.4 (L) 54.0 - 98.1 %   MCV 92.1 78.0 - 100.0 fL   MCH 32.1 26.0 - 34.0 pg   MCHC 34.8 30.0 - 36.0 g/dL   RDW 19.1 47.8 - 29.5 %   Platelets 234 150 - 400 K/uL   Neutrophils Relative % 63 %   Neutro Abs 4.9 1.7 - 7.7 K/uL   Lymphocytes Relative 20 %   Lymphs Abs 1.5 0.7 - 4.0 K/uL   Monocytes Relative 9 %   Monocytes Absolute 0.7 0.1 - 1.0 K/uL   Eosinophils Relative 8 %   Eosinophils Absolute 0.6 0.0 - 0.7 K/uL   Basophils Relative 0 %   Basophils Absolute 0.0 0.0 - 0.1 K/uL  Basic metabolic panel  Result Value Ref Range   Sodium 130 (L) 135 - 145 mmol/L   Potassium 4.2 3.5 - 5.1 mmol/L   Chloride 100 (L) 101 - 111 mmol/L   CO2 25 22 - 32 mmol/L   Glucose, Bld 141 (H) 65 - 99 mg/dL    BUN 22 (H) 6 - 20 mg/dL   Creatinine, Ser 6.21 (H) 0.61 - 1.24 mg/dL   Calcium 8.1 (L) 8.9 - 10.3 mg/dL   GFR calc non Af Amer 46 (L) >60 mL/min   GFR calc Af Amer 53 (L) >60 mL/min   Anion gap 5 5 - 15  Troponin I  Result Value Ref Range   Troponin I <0.03 <0.03 ng/mL  Hepatic function panel  Result Value Ref Range   Total Protein 6.1 (L) 6.5 - 8.1 g/dL   Albumin 2.6 (L) 3.5 - 5.0 g/dL   AST 33 15 - 41 U/L   ALT 19 17 - 63 U/L   Alkaline Phosphatase 86 38 - 126 U/L   Total Bilirubin 0.7 0.3 - 1.2 mg/dL   Bilirubin, Direct <3.0 (L) 0.1 - 0.5 mg/dL   Indirect Bilirubin NOT CALCULATED 0.3 - 0.9 mg/dL   Ct Angio Chest Pe W Or Wo Contrast  Result Date: 03/26/2016 CLINICAL DATA:  Lower leg swelling for 1 month, elevated D-dimer. EXAM: CT ANGIOGRAPHY CHEST WITH CONTRAST TECHNIQUE: Multidetector CT imaging of the chest was performed using the standard protocol during bolus administration of intravenous contrast. Multiplanar CT image reconstructions and MIPs were obtained to evaluate the vascular anatomy. CONTRAST:  100 cc Isovue 370 IV COMPARISON:  CXR 01/06/2016, 03/1934 FINDINGS: Cardiovascular: The study is of quality for the evaluation of pulmonary embolism. There are no filling defects in the central, lobar, segmental or subsegmental pulmonary artery branches to suggest acute pulmonary embolism. Great vessels are normal in course and caliber. There is cardiomegaly with coronary arteriosclerosis. No significant pericardial effusion. Post CABG change noted. Median sternotomy sutures are in place. Mediastinum/Nodes: No discrete thyroid nodules. Unremarkable esophagus. No pathologically enlarged axillary, mediastinal or hilar lymph nodes. Lungs/Pleura: No pneumothorax. Moderate right  and small left pleural effusions are noted with compressive atelectasis. Small subpleural areas of interstitial fibrosis noted in the visualized upper lobes with subpleural blebs in the bilateral upper lobes and left  lower lobe. Upper abdomen: Scans PE Musculoskeletal: No aggressive appearing focal osseous lesions. Chronic subcortical erosive change of the distal left clavicle at the [facility] joint since 2005 likely a benign finding possibly related to inflammatory arthritis or posttraumatic change. Review of the MIP images confirms the above findings. IMPRESSION: No pulmonary embolus. Moderate right and small left pleural effusions with compressive atelectasis. No overt pulmonary edema. Mild subpleural interstitial fibrosis of the visualized lungs. Electronically Signed   By: Tollie Eth M.D.   On: 03/26/2016 22:51    Radiology No results found.  Procedures Procedures (including critical care time)  Medications Ordered in ED Medications - No data to display  Overnight observation offered the patient which he declines. He prefers outpatient DVT studies tomorrow. Pretest clinical suspicion for DVT is low. Symptoms most likely consistent with congestive heart failure Initial Impression / Assessment and Plan / ED Course  I have reviewed the triage vital signs and the nursing notes.  Pertinent labs & imaging results that were available during my care of the patient were reviewed by me and considered in my medical decision making (see chart for details).  Clinical Course    Plan instructed take Lasix 40 mg by mouth on arrival home. Then take Lasix 80 mg daily for 2 days starting tomorrow. Follow-up with Dr. Katrinka Blazing on Monday, 03/29/2016  Anemia is chronic, renal insufficiency is chronic Final Clinical Impressions(s) / ED Diagnoses   Final diagnoses:  None  Diagnosis #1 chronic congestive heart failure #2 anemia #3 chronic renal insufficiency  New Prescriptions New Prescriptions   No medications on file     Doug Sou, MD 03/26/16 2310    Doug Sou, MD 03/26/16 2311

## 2016-03-26 NOTE — ED Notes (Addendum)
Per PCP-elevated D-dimer, SOB-coming via private vehicle-PCP wants a CT of chest with contrast

## 2016-03-26 NOTE — Telephone Encounter (Signed)
Pt calling regarding-legs swelling from hips down, for "quite a while" SOB-nausea when first gets out of bed in the am-pls advise

## 2016-03-26 NOTE — ED Notes (Signed)
Pt from home following a visit at his PCP. Pt has had bilateral lower leg swelling for about a month. Pt was seen by his PCP who did a d-dimer today. This resulted with an elevated value and pt was called and told to come to the ED. Pt states the swelling has risen above his knees. Pt reports 0/10 pain while he is sitting, but has pain when he puts weight on his feet. Pt states his PCP has also told him to increase his lasix at home. Pt does have redness and swelling in both lower extremities that is warm to the touch. Pt has minimal pain with palpation. Pt is ambulatory

## 2016-03-26 NOTE — Telephone Encounter (Signed)
Pt states that he is still having trouble with both legs since he last seen Dr Katrinka Blazing.  Pt states swelling is worse and is now extends all the way up to his hips.  Swelling causing pain and pt can only walk about 20-30 feet at a time.  States there is some redness.  Swelling improves in calves at night but swell right back up once he is out of bed.  Pt feels like fluid pill is not helping at all.  At night when pt rolls from side to side he says he gets SOB.  Vitals at home are usually 130-140/58-62, HE 58-60 when resting but jumps up to 160-170/60.  Only other issues pt states he has is he also feels bloated and belches a lot.  Advised pt that I will send message to Dr. Katrinka Blazing for review and advisement.   Pt had LEA on 10/2 that showed no evidence of underlying PVD.

## 2016-03-26 NOTE — Telephone Encounter (Signed)
Spoke with pt and advised him of recommendations and orders per Dr. Katrinka Blazing.  Pt agreeable to come to office today for labs and urinalysis.  Advised pt while here to please stop by scheduling and get doppler scheduled. Pt aware to take Lasix 80mg  QD x 2 days.  Pt verbalized understanding and was in agreement with this plan.  Pt would like to wait for test results prior to scheduling appt with APP.

## 2016-03-26 NOTE — Addendum Note (Signed)
Addended by: Tonita Phoenix on: 03/26/2016 03:36 PM   Modules accepted: Orders

## 2016-03-27 ENCOUNTER — Ambulatory Visit (HOSPITAL_COMMUNITY)
Admission: RE | Admit: 2016-03-27 | Discharge: 2016-03-27 | Disposition: A | Discharge status: Home / Self Care | Payer: Federal, State, Local not specified - PPO | Source: Ambulatory Visit | Attending: Emergency Medicine | Admitting: Emergency Medicine

## 2016-03-27 DIAGNOSIS — M7989 Other specified soft tissue disorders: Secondary | ICD-10-CM

## 2016-03-27 LAB — COMPREHENSIVE METABOLIC PANEL
ALBUMIN: 2.4 g/dL — AB (ref 3.6–5.1)
ALK PHOS: 80 U/L (ref 40–115)
ALT: 14 U/L (ref 9–46)
AST: 28 U/L (ref 10–35)
BUN: 21 mg/dL (ref 7–25)
CALCIUM: 7.7 mg/dL — AB (ref 8.6–10.3)
CO2: 22 mmol/L (ref 20–31)
Chloride: 99 mmol/L (ref 98–110)
Creat: 1.44 mg/dL — ABNORMAL HIGH (ref 0.70–1.18)
Glucose, Bld: 107 mg/dL — ABNORMAL HIGH (ref 65–99)
POTASSIUM: 4.1 mmol/L (ref 3.5–5.3)
Sodium: 131 mmol/L — ABNORMAL LOW (ref 135–146)
TOTAL PROTEIN: 5.4 g/dL — AB (ref 6.1–8.1)
Total Bilirubin: 0.3 mg/dL (ref 0.2–1.2)

## 2016-03-27 LAB — URINALYSIS
Bilirubin Urine: NEGATIVE
Glucose, UA: NEGATIVE
Ketones, ur: NEGATIVE
LEUKOCYTES UA: NEGATIVE
NITRITE: NEGATIVE
PH: 6 (ref 5.0–8.0)
SPECIFIC GRAVITY, URINE: 1.01 (ref 1.001–1.035)

## 2016-03-27 NOTE — ED Notes (Signed)
Best phone number to contact patient for Doppler/US/medical results: [phone]

## 2016-03-28 NOTE — Progress Notes (Addendum)
VASCULAR LAB PRELIMINARY  PRELIMINARY  PRELIMINARY  PRELIMINARY  Bilateral lower extremity venous duplex completed.    Preliminary report:  Bilateral:  No evidence of DVT, superficial thrombosis, or Baker's Cyst.   Lindsi Bayliss, RVS 03/27/2016 8:45 AM

## 2016-03-29 NOTE — Progress Notes (Signed)
Cardiology Office Note    Date:  03/31/2016   ID:  [name], [name] [birth year], MRN [id number]  PCP:  Lupita Raider, MD  Cardiologist:  Lesleigh Noe, MD   CC: bilateral LE edema   History of Present Illness:   [name] is a 75 y.o. male with a history of CAD s/p CABG (2005), DMT2, HLD, chronic diastolic CHF, anemia of chronic disease, PVCs and CKD who presents to clinic for evaluation of LE edema  Recent heart cath in 07/2015 did not demonstrate actionable lesions. He also has had difficulty with hyponatremia related to diuretic therapy and kidney dysfunction.  He saw Dr. Katrinka Blazing in 01/2016 and complained of exertional LE pain. Follow up ABIs were normal.  He called into the office on 10/27 for swelling and SOB. BNP was ordered which was 1400. Ddimer mildly elevated but follow up CTA negative for PE. UA showed 3+ protein and low albumin. Dr. Katrinka Blazing felt kidney disease may be contributing to leg edema and needed a 24 hour urine for total protein to r/o nephrotic syndrome.  He was seen in the [facility] ER on 03/27/16 for LE edema. BP was also quite elevated to 211/60. LE dopplers were negative for DVT. His lasix was increased from 40mg  daily to 80mg  daily for x3 days. Weight was 190.   Today he presents to clinic for follow up. Weight down to 183.8. His was having swelling up to his thighs and it is much improved but not back to baseline. No chest pain. His SOB is not much better. He still has orthopnea and PND. No dizziness or syncope. No blood in his stool or urine. He has pain in his legs from statins a long time ago. He checks his BP at home and SBP runs in 160- 170s.    Past Medical History:  Diagnosis Date  . Alcohol abuse   . Arthritis    "just about qwhere now" (08/28/2015)  . Benign prostatic hyperplasia   . CAD (coronary artery disease) 2005   mLAD ~90%, D1/RI-70, mRCA 70% --> CABG 3: LIMA-LAD, SVG-D1/RI, SVG-dRCA  . HTN (hypertension)   . Hyperlipemia   . Iron  deficiency anemia   . Myocardial infarction 1995   s/p PTCA  . Obesity   . Peripheral vascular disease (HCC)   . PONV (postoperative nausea and vomiting)   . Type II diabetes mellitus (HCC)     Past Surgical History:  Procedure Laterality Date  . ABDOMINAL HERNIA REPAIR    . CARDIAC CATHETERIZATION  2005   mLAD ~90%, D1/RI-70, mRCA 70%   . CARDIAC CATHETERIZATION N/A 08/29/2015   Procedure: Left Heart Cath and Coronary Angiography;  Surgeon: Marykay Lex, MD;  Location: [facility] INVASIVE CV LAB;  Service: Cardiovascular;  Laterality: N/A;  . CAROTID ENDARTERECTOMY    . CORONARY ANGIOPLASTY WITH STENT PLACEMENT  12/1993; 04/1994  . CORONARY ARTERY BYPASS GRAFT  2005   CABG 3: LIMA-LAD, SVG-D1/RI, SVG-dRCA (Dr.VanTrigt)  . EYE SURGERY Left 1968   "dug hot steel out that was imbedded"   . HERNIA REPAIR    . INGUINAL HERNIA REPAIR Right   . KNEE ARTHROSCOPY Left   . KNEE CARTILAGE SURGERY Right 1960s   "took cartilage out"  . SHOULDER ARTHROSCOPY W/ ROTATOR CUFF REPAIR Left   . SKIN CANCER DESTRUCTION     "6 cut off" (08/28/2015)  . UMBILICAL HERNIA REPAIR      Current Medications: Outpatient Medications Prior to Visit  Medication Sig  Dispense Refill  . aspirin EC 81 MG tablet Take 81 mg by mouth at bedtime.     . Coenzyme Q10 (COQ10) 100 MG CAPS Take 100 mg by mouth daily.    Marland Kitchen. ezetimibe (ZETIA) 10 MG tablet Take 5 mg by mouth daily.    . ferrous sulfate 325 (65 FE) MG tablet Take 325 mg by mouth at bedtime.     . metFORMIN (GLUCOPHAGE) 1000 MG tablet Take 1,000 mg by mouth 2 (two) times daily with a meal.    . metoprolol (LOPRESSOR) 100 MG tablet Take 100 mg by mouth 2 (two) times daily.    . niacin (NIASPAN) 1000 MG CR tablet Take 1,000 mg by mouth 2 (two) times daily.    . nitroGLYCERIN (NITROSTAT) 0.4 MG SL tablet Place 0.4 mg under the tongue every 5 (five) minutes as needed for chest pain.    . furosemide (LASIX) 40 MG tablet Take 80 mg by mouth daily.      No  facility-administered medications prior to visit.      Allergies:   Food; Other; Statins; Fish oil; and Norvasc [amlodipine]   Social History   Social History  . Marital status: Married    Spouse name: N/A  . Number of children: N/A  . Years of education: N/A   Social History Main Topics  . Smoking status: Former Smoker    Packs/day: 1.50    Years: 35.00    Types: Cigarettes    Quit date: 01/08/1994  . Smokeless tobacco: Never Used  . Alcohol use 16.8 oz/week    28 Cans of beer per week     Comment: 3-5 beers everyday.  . Drug use: No  . Sexual activity: Not Currently   Other Topics Concern  . None   Social History Narrative  . None     Family History:  The patient's family history includes CAD in his father; Heart attack in his mother.     ROS:   Please see the history of present illness.    ROS All other systems reviewed and are negative.   PHYSICAL EXAM:   VS:  BP (!) 188/60   Pulse (!) 32   Ht 5\' 8"  (1.727 m)   Wt 183 lb 12.8 oz (83.4 kg)   BMI 27.95 kg/m    GEN: Well nourished, well developed, in no acute distress  HEENT: normal  Neck: no JVD, carotid bruits, or masses Cardiac: RRR; no murmurs, rubs, or gallops, 1+ bilateral edema  Respiratory:  clear to auscultation bilaterally, normal work of breathing GI: soft, nontender, nondistended, + BS MS: no deformity or atrophy  Skin: warm and dry, no rash Neuro:  Alert and Oriented x 3, Strength and sensation are intact Psych: euthymic mood, full affect  Wt Readings from Last 3 Encounters:  03/31/16 183 lb 12.8 oz (83.4 kg)  03/26/16 190 lb (86.2 kg)  02/24/16 177 lb (80.3 kg)      Studies/Labs Reviewed:   EKG:  EKG is ordered today.  The ekg ordered today demonstrates sinu with bigeminy and posterior fascicular block. HR 61  Recent Labs: 03/26/2016: ALT 19; B Natriuretic Peptide 1,405.5; BUN 22; Creatinine, Ser 1.44; Hemoglobin 10.9; Platelets 234; Potassium 4.2; Sodium 130   Lipid Panel No  results found for: CHOL, TRIG, HDL, CHOLHDL, VLDL, LDLCALC, LDLDIRECT  Additional studies/ records that were reviewed today include:  2D ECHO: 08/28/2015  LV EF: 65% -   70% - Left ventricle: The cavity size was normal.  Wall thickness was   increased in a pattern of mild LVH. Systolic function was   vigorous. The estimated ejection fraction was in the range of 65%   to 70%. Wall motion was normal; there were no regional wall   motion abnormalities. Doppler parameters are consistent with   abnormal left ventricular relaxation (grade 1 diastolic   dysfunction). The E/e&' ratio is between 8-15, suggesting   indeterminate LV filling pressure. - Mitral valve: Mildly thickened leaflets . There was trivial   regurgitation. - Left atrium: The atrium was mildly dilated. - Right ventricle: The cavity size was mildly dilated. Systolic   function is reduced. - Right atrium: Severely dilated at 28 cm2. - Tricuspid valve: There was moderate regurgitation. - Pulmonic valve: The valve appears to be grossly normal. There was   mild regurgitation. - Pulmonary arteries: PA peak pressure: 41 mm Hg (S). - Inferior vena cava: The vessel was dilated. The respirophasic   diameter changes were blunted (< 50%), consistent with elevated   central venous pressure. Impressions: - LVEF 65-70%, mild LVH, normal wall motion, diastolic dysfunction,   indeterminate LV filling pressure, mild LAE, dilated RV with   reduced systolic function, severe RAE, moderate TR, mild PI, RVSP   41 mmHg, dilated IVC.   Procedures 08/29/15 Left Heart Cath and Coronary Angiography  Conclusion   Ost 1st Diag to 1st Diag lesion, 100% stenosed. SVG-DIAG is normal in caliber, and is anatomically normal.  Ost LAD to Prox LAD lesion, 80% stenosed. Prox LAD lesion, 100% stenosed. LIMA-LAD is normal in caliber, and is anatomically normal.  Prox RCA to Mid RCA lesion, 80% stenosed. SVG-dRCA is large and competitive flow minimizes the  antegrade flow from the native coronary.  Ost RPDA lesion, 60% stenosed.  The left ventricular systolic function is normal.     ASSESSMENT & PLAN:   Acute on chronic diastolic CHF: volume status a little better on  lasix x2-3 days. Will continue  x2-3 more days and check BMET today and Friday. Then go back to lasix  daily. There also may be a component of nephrotic syndrome with 3+ protein in his urine. Will obtain a 24 hour urine.   HTN: BP uncontrolled at 188/60. Intolerant to amlodipine. Now on Lopressor  BID. He says he has had been on an ACE/ARB in the past but he got hyperkalemia. WIll add hydralazine  TID and have him keep a BP log at home and call us if BPs consistently running >140/90  CAD s/p CABG: no angina. Continue ASA and BB. Intolerant to statins. Recent cath 07/2015 with no interventions and continued medical therapy  CKD: creat 1.44 on 03/26/16. Will order a BMET with increased diuretic therapy.   HLD: intolerant to statins. Last LDL 81 in 01/2016. Followed by PCP  DMT2: last HgA1c 6.4 in 01/2016. Continue current regimen   PVCs: asymptomatic and 2D ECHO in 07/2015 with normal LV function   Medication Adjustments/Labs and Tests Ordered: Current medicines are reviewed at length with the patient today.  Concerns regarding medicines are outlined above.  Medication changes, Labs and Tests ordered today are listed in the Patient Instructions below. Patient Instructions  Medication Instructions:  Your physician has recommended you make the following change in your medication:  1.  INCREASE the Lasix to 40 mg taking 2 tablets X's 3 days then go back to 1 tablet daily 2.  START Hydralazine 25 mg taking 1 tablet by mouth 3 times daily  Labwork: TODAY:  BMET &  24 HOUR URINE Friday 04-02-16:  BMET  Testing/Procedures: None ordered  Follow-Up: Your physician recommends that you schedule a follow-up appointment in: 04-12-16 ARRIVE AT 10:15 TO SEE KATIE  Dallon Dacosta, PA-C   Any Other Special Instructions Will Be Listed Below (If Applicable).  Keep a log of your blood pressure and call us on Friday if it is continuously running higher than 140/90.    Signed, Cline Crock, PA-C  03/31/2016 12:04 PM    [facility] [address] [place], [place], [place]  [address] Phone: [phone]; Fax: [phone]

## 2016-03-31 ENCOUNTER — Encounter: Payer: Self-pay | Admitting: Physician Assistant

## 2016-03-31 ENCOUNTER — Ambulatory Visit (INDEPENDENT_AMBULATORY_CARE_PROVIDER_SITE_OTHER): Payer: Federal, State, Local not specified - PPO | Admitting: Physician Assistant

## 2016-03-31 VITALS — BP 188/60 | HR 32 | Ht 68.0 in | Wt 183.8 lb

## 2016-03-31 DIAGNOSIS — R600 Localized edema: Secondary | ICD-10-CM | POA: Diagnosis not present

## 2016-03-31 DIAGNOSIS — I25810 Atherosclerosis of coronary artery bypass graft(s) without angina pectoris: Secondary | ICD-10-CM | POA: Diagnosis not present

## 2016-03-31 DIAGNOSIS — I2510 Atherosclerotic heart disease of native coronary artery without angina pectoris: Secondary | ICD-10-CM

## 2016-03-31 DIAGNOSIS — E785 Hyperlipidemia, unspecified: Secondary | ICD-10-CM

## 2016-03-31 DIAGNOSIS — I10 Essential (primary) hypertension: Secondary | ICD-10-CM | POA: Diagnosis not present

## 2016-03-31 DIAGNOSIS — R809 Proteinuria, unspecified: Secondary | ICD-10-CM

## 2016-03-31 DIAGNOSIS — I110 Hypertensive heart disease with heart failure: Secondary | ICD-10-CM | POA: Diagnosis not present

## 2016-03-31 DIAGNOSIS — I493 Ventricular premature depolarization: Secondary | ICD-10-CM

## 2016-03-31 DIAGNOSIS — N189 Chronic kidney disease, unspecified: Secondary | ICD-10-CM

## 2016-03-31 DIAGNOSIS — I5033 Acute on chronic diastolic (congestive) heart failure: Secondary | ICD-10-CM

## 2016-03-31 LAB — BASIC METABOLIC PANEL
BUN: 26 mg/dL — AB (ref 7–25)
CALCIUM: 7.9 mg/dL — AB (ref 8.6–10.3)
CO2: 24 mmol/L (ref 20–31)
CREATININE: 2.45 mg/dL — AB (ref 0.70–1.18)
Chloride: 98 mmol/L (ref 98–110)
Glucose, Bld: 110 mg/dL — ABNORMAL HIGH (ref 65–99)
Potassium: 3.9 mmol/L (ref 3.5–5.3)
Sodium: 132 mmol/L — ABNORMAL LOW (ref 135–146)

## 2016-03-31 MED ORDER — HYDRALAZINE HCL 25 MG PO TABS: 25.0000 mg | ORAL_TABLET | Freq: Three times a day (TID) | ORAL | 3 refills | Status: DC

## 2016-03-31 MED ORDER — FUROSEMIDE 40 MG PO TABS: ORAL_TABLET | ORAL | 2 refills | Status: DC

## 2016-03-31 NOTE — Patient Instructions (Addendum)
Medication Instructions:  Your physician has recommended you make the following change in your medication:  1.  INCREASE the Lasix to 40 mg taking 2 tablets X's 3 days then go back to 1 tablet daily 2.  START Hydralazine 25 mg taking 1 tablet by mouth 3 times daily  Labwork: TODAY:  BMET & 24 HOUR URINE Friday 04-02-16:  BMET  Testing/Procedures: None ordered  Follow-Up: Your physician recommends that you schedule a follow-up appointment in: 04-12-16 ARRIVE AT 10:15 TO SEE KATIE THOMPSON, PA-C   Any Other Special Instructions Will Be Listed Below (If Applicable).  Keep a log of your blood pressure and call us on Friday if it is continuously running higher than 140/90.

## 2016-04-01 ENCOUNTER — Telehealth: Payer: Self-pay | Admitting: *Deleted

## 2016-04-01 MED ORDER — FUROSEMIDE 40 MG PO TABS: 40.0000 mg | ORAL_TABLET | Freq: Every day | ORAL | 2 refills | Status: DC

## 2016-04-01 NOTE — Telephone Encounter (Signed)
Pt has been made aware of his lab results. He will not increase the Lasix X's 3 days, he will only go back to original dose of 40 mg qd. Pt is agreeable with this plan and will come back for labs 04-02-16.

## 2016-04-01 NOTE — Telephone Encounter (Signed)
-----   Message from Janetta Hora, PA-C sent at 04/01/2016  6:16 AM EDT ----- His creat is a lot worse. I would like him to go back to 40mg  lasix daily instead of 80mg . Still want to check repeat BMET Friday. We will wait to see results of 24 hour urine.

## 2016-04-02 ENCOUNTER — Other Ambulatory Visit: Payer: Federal, State, Local not specified - PPO | Admitting: *Deleted

## 2016-04-02 ENCOUNTER — Telehealth: Payer: Self-pay | Admitting: *Deleted

## 2016-04-02 DIAGNOSIS — I10 Essential (primary) hypertension: Secondary | ICD-10-CM

## 2016-04-02 DIAGNOSIS — I25810 Atherosclerosis of coronary artery bypass graft(s) without angina pectoris: Secondary | ICD-10-CM

## 2016-04-02 DIAGNOSIS — R600 Localized edema: Secondary | ICD-10-CM

## 2016-04-02 LAB — BASIC METABOLIC PANEL
BUN: 22 mg/dL (ref 7–25)
CO2: 22 mmol/L (ref 20–31)
Calcium: 7.6 mg/dL — ABNORMAL LOW (ref 8.6–10.3)
Chloride: 97 mmol/L — ABNORMAL LOW (ref 98–110)
Creat: 1.97 mg/dL — ABNORMAL HIGH (ref 0.70–1.18)
GLUCOSE: 195 mg/dL — AB (ref 65–99)
POTASSIUM: 4.1 mmol/L (ref 3.5–5.3)
SODIUM: 130 mmol/L — AB (ref 135–146)

## 2016-04-02 NOTE — Telephone Encounter (Signed)
Pt brought his bp readings and HR in:  From Left to right:   04/01/16 A.M. 182 64  61        173 56  58  138 54  53  PM 194 63  49  137 62  96 146 58  85 182 63  45  04-02-16 A.M. 138 41  40  Will send to Carlean Jews, PA-C

## 2016-04-02 NOTE — Telephone Encounter (Signed)
Left pt a message to call back re: medication change due to his bp readings he dropped off at the office. Per Carlean Jews, PA-C, she wants to increase Hydralazine to 50 mg tid and have pt continue with bp log.

## 2016-04-02 NOTE — Telephone Encounter (Signed)
Lets increase hydralazine to 50mg  TID and have him continue his log. Thanks

## 2016-04-04 LAB — PROTEIN, URINE, 24 HOUR
PROTEIN 24H UR: 5652 mg/(24.h) — AB (ref 30–150)
PROTEIN UR: 235.5 mg/dL

## 2016-04-06 MED ORDER — HYDRALAZINE HCL 50 MG PO TABS: 50.0000 mg | ORAL_TABLET | Freq: Three times a day (TID) | ORAL | 3 refills | Status: DC

## 2016-04-06 NOTE — Progress Notes (Signed)
Cardiology Office Note    Date:  04/12/2016   ID:  [name], [name] May 11, 1941, MRN [id number]  PCP:  Lupita Raider, MD  Cardiologist:  Dr. Katrinka Blazing   CC: follow up  History of Present Illness:  [name] is a 75 y.o. male with a history of CAD s/p CABG x3 (2005), DMT2, HLD, chronic diastolic CHF, anemia of chronic disease, PVCs and CKD who presents to clinic for follow up.   Recent heart cath in 07/2015 showed diffuse disease of native vessels but patent grafts. EF was normal at 55-65%. No PCI was required. Continued medical therapy was recommended. He also has had difficulty with hyponatremia related to diuretic therapy and kidney dysfunction.  He saw Dr. Katrinka Blazing in 01/2016 and complained of exertional LE pain. Follow up ABIs were normal.  He called into the office on 10/27 for swelling and SOB. BNP was ordered which was 1400. Ddimer mildly elevated but follow up CTA negative for PE. UA showed 3+ protein and low albumin. Dr. Katrinka Blazing felt kidney disease may be contributing to leg edema and needed a 24 hour urine for total protein to r/o nephrotic syndrome.  He was seen in the [facility] ER on 03/27/16 for LE edema. BP was also quite elevated to 211/60. LE dopplers were negative for DVT. His lasix was increased from 40mg  daily to 80mg  daily for x3 days. Weight was 190 lbs.   I saw him in clinic on 03/31/16 for follow up. His volume status was better but still not at baseline. I instructed him to increase lasix 40mg  daily to 80mg  x 3 days. However, a follow up BMET showed AKI with creat 1.44--> 2.45 and I advised him to go back to 40mg  daily. He did a 24 hour urine which showed >5 g of protein in his urine. I have asked him to see nephrology. His BP was also high at that appointment ( 188/60). I added hydralazine 25mg  TID. He kept a log of BPs and they remained high. I further increased this to 50mg  TID.  Today he presents to clinic for follow up. He is feeling terrible. Just so exhausted and  can't do anything. Just feels like sleeping all day. Legs hurt and he continues to have swelling. He has had no appetite and feeling nauseated. He really hasnt felt great since March of this year. BPs overall have been a lot better. No chest pain. He does have some SOB that is chronic and no worse. Some orthopnea but PND. No dizziness or syncope. No palpitations. His BPs have been much better recently.    Past Medical History:  Diagnosis Date  . Alcohol abuse   . Arthritis    "just about qwhere now" (08/28/2015)  . Benign prostatic hyperplasia   . CAD (coronary artery disease) 2005   mLAD ~90%, D1/RI-70, mRCA 70% --> CABG 3: LIMA-LAD, SVG-D1/RI, SVG-dRCA  . HTN (hypertension)   . Hyperlipemia   . Iron deficiency anemia   . Myocardial infarction 1995   s/p PTCA  . Obesity   . Peripheral vascular disease (HCC)   . PONV (postoperative nausea and vomiting)   . Type II diabetes mellitus (HCC)     Past Surgical History:  Procedure Laterality Date  . ABDOMINAL HERNIA REPAIR    . CARDIAC CATHETERIZATION  2005   mLAD ~90%, D1/RI-70, mRCA 70%   . CARDIAC CATHETERIZATION N/A 08/29/2015   Procedure: Left Heart Cath and Coronary Angiography;  Surgeon: Marykay Lex, MD;  Location: [facility]  INVASIVE CV LAB;  Service: Cardiovascular;  Laterality: N/A;  . CAROTID ENDARTERECTOMY    . CORONARY ANGIOPLASTY WITH STENT PLACEMENT  12/1993; 04/1994  . CORONARY ARTERY BYPASS GRAFT  2005   CABG 3: LIMA-LAD, SVG-D1/RI, SVG-dRCA (Dr.VanTrigt)  . EYE SURGERY Left 1968   "dug hot steel out that was imbedded"   . HERNIA REPAIR    . INGUINAL HERNIA REPAIR Right   . KNEE ARTHROSCOPY Left   . KNEE CARTILAGE SURGERY Right 1960s   "took cartilage out"  . SHOULDER ARTHROSCOPY W/ ROTATOR CUFF REPAIR Left   . SKIN CANCER DESTRUCTION     "6 cut off" (08/28/2015)  . UMBILICAL HERNIA REPAIR      Current Medications: Outpatient Medications Prior to Visit  Medication Sig Dispense Refill  . aspirin EC 81 MG tablet  Take 81 mg by mouth at bedtime.     . Coenzyme Q10 (COQ10) 100 MG CAPS Take 100 mg by mouth daily.    Marland Kitchen. ezetimibe (ZETIA) 10 MG tablet Take 5 mg by mouth daily.    . ferrous sulfate 325 (65 FE) MG tablet Take 325 mg by mouth at bedtime.     . furosemide (LASIX) 40 MG tablet Take 1 tablet (40 mg total) by mouth daily. 33 tablet 2  . hydrALAZINE (APRESOLINE) 50 MG tablet Take 1 tablet (50 mg total) by mouth 3 (three) times daily. 270 tablet 3  . metFORMIN (GLUCOPHAGE) 1000 MG tablet Take 1,000 mg by mouth 2 (two) times daily with a meal.    . metoprolol (LOPRESSOR) 100 MG tablet Take 100 mg by mouth 2 (two) times daily.    . niacin (NIASPAN) 1000 MG CR tablet Take 1,000 mg by mouth 2 (two) times daily.    . nitroGLYCERIN (NITROSTAT) 0.4 MG SL tablet Place 0.4 mg under the tongue every 5 (five) minutes as needed for chest pain.     No facility-administered medications prior to visit.      Allergies:   Food; Other; Statins; Fish oil; and Norvasc [amlodipine]   Social History   Social History  . Marital status: Married    Spouse name: N/A  . Number of children: N/A  . Years of education: N/A   Social History Main Topics  . Smoking status: Former Smoker    Packs/day: 1.50    Years: 35.00    Types: Cigarettes    Quit date: 01/08/1994  . Smokeless tobacco: Never Used  . Alcohol use 16.8 oz/week    28 Cans of beer per week     Comment: 3-5 beers everyday.  . Drug use: No  . Sexual activity: Not Currently   Other Topics Concern  . Not on file   Social History Narrative  . No narrative on file     Family History:  The patient's family history includes CAD in his father; Heart attack in his mother.     ROS:   Please see the history of present illness.    ROS All other systems reviewed and are negative.   PHYSICAL EXAM:   VS:  BP (!) 154/54 (BP Location: Right Arm, Patient Position: Sitting, Cuff Size: Large)   Pulse 75   Ht 5\' 8"  (1.727 m)   Wt 185 lb 6.4 oz (84.1 kg)    SpO2 97%   BMI 28.19 kg/m    GEN: Well nourished, well developed, in no acute distress  HEENT: normal  Neck: no JVD, carotid bruits, or masses Cardiac: RRR; no murmurs, rubs, or  gallops,no edema  Respiratory:  clear to auscultation bilaterally, normal work of breathing GI: soft, nontender, nondistended, + BS MS: no deformity or atrophy  Skin: warm and dry, no rash Neuro:  Alert and Oriented x 3, Strength and sensation are intact Psych: euthymic mood, full affect  Wt Readings from Last 3 Encounters:  04/12/16 185 lb 6.4 oz (84.1 kg)  03/31/16 183 lb 12.8 oz (83.4 kg)  03/26/16 190 lb (86.2 kg)      Studies/Labs Reviewed:   EKG:  EKG is ordered today.  The ekg ordered today demonstrates sinus bradycardia HR 59 with bigeminy  Recent Labs: 03/26/2016: ALT 19; B Natriuretic Peptide 1,405.5; Hemoglobin 10.9; Platelets 234 04/02/2016: BUN 22; Creat 1.97; Potassium 4.1; Sodium 130   Lipid Panel No results found for: CHOL, TRIG, HDL, CHOLHDL, VLDL, LDLCALC, LDLDIRECT  Additional studies/ records that were reviewed today include:  2D ECHO: 08/28/2015  LV EF: 65% - 70% - Left ventricle: The cavity size was normal. Wall thickness was increased in a pattern of mild LVH. Systolic function was vigorous. The estimated ejection fraction was in the range of 65% to 70%. Wall motion was normal; there were no regional wall motion abnormalities. Doppler parameters are consistent with abnormal left ventricular relaxation (grade 1 diastolic dysfunction). The E/e&' ratio is between 8-15, suggesting indeterminate LV filling pressure. - Mitral valve: Mildly thickened leaflets . There was trivial regurgitation. - Left atrium: The atrium was mildly dilated. - Right ventricle: The cavity size was mildly dilated. Systolic function is reduced. - Right atrium: Severely dilated at 28 cm2. - Tricuspid valve: There was moderate regurgitation. - Pulmonic valve: The valve appears to be  grossly normal. There was mild regurgitation. - Pulmonary arteries: PA peak pressure: 41 mm Hg (S). - Inferior vena cava: The vessel was dilated. The respirophasic diameter changes were blunted (<50%), consistent with elevated central venous pressure. Impressions: - LVEF 65-70%, mild LVH, normal wall motion, diastolic dysfunction, indeterminate LV filling pressure, mild LAE, dilated RV with reduced systolic function, severe RAE, moderate TR, mild PI, RVSP 41 mmHg, dilated IVC.   Procedures 08/29/15 Left Heart Cath and Coronary Angiography  Conclusion   Ost 1st Diag to 1st Diag lesion, 100% stenosed. SVG-DIAG is normal in caliber, and is anatomically normal.  Ost LAD to Prox LAD lesion, 80% stenosed. Prox LAD lesion, 100% stenosed. LIMA-LAD is normal in caliber, and is anatomically normal.  Prox RCA to Mid RCA lesion, 80% stenosed. SVG-dRCA is large and competitive flow minimizes the antegrade flow from the native coronary.  Ost RPDA lesion, 60% stenosed.  The left ventricular systolic function is normal.      ASSESSMENT & PLAN:   Chronic diastolic CHF:  He has had some worsening LE edema, which I think is in large part 2/2 nephrotic syndrome (recent 24 hour urine with >5g protein.) Will have him seen by nephrology, Dr. Hyman Hopes. He will continue lasix 40mg  daily for now. Will get BMET today  HTN: BP much better controlled on Lopressor 100mg  BID and hydralazine 50mg  TID. I manually took it and it was 140/40. --  Intolerant to amlodipine. He says he has had been on an ACE/ARB in the past complicated by hyperkalemia.  CAD s/p CABG: no angina. Continue ASA and BB. Intolerant to statins. Recent cath 07/2015 with no interventions and continued medical therapy  CKD: creat 1.97 on 04/02/16. Will order a BMET today  HLD: intolerant to statins. Last LDL 81 in 01/2016. Followed by PCP  DMT2: last HgA1c  6.4 in 01/2016. Continue current regimen   PVCs: asymptomatic and 2D  ECHO in 07/2015 with normal LV function   Fatigue: will check a TSH  Medication Adjustments/Labs and Tests Ordered: Current medicines are reviewed at length with the patient today.  Concerns regarding medicines are outlined above.  Medication changes, Labs and Tests ordered today are listed in the Patient Instructions below. Patient Instructions  Medication Instructions:  Your physician recommends that you continue on your current medications as directed. Please refer to the Current Medication list given to you today.   Labwork: TODAY:  BMET & TSH  Testing/Procedures: None ordered  Follow-Up: Your physician recommends that you schedule a follow-up appointment in: 3 MONTHS WITH DR. Katrinka Blazing   Any Other Special Instructions Will Be Listed Below (If Applicable).     If you need a refill on your cardiac medications before your next appointment, please call your pharmacy.      Signed, Cline Crock, PA-C  04/12/2016 11:31 AM    [facility] [address] [place], [place], [place]  [address] Phone: [phone]; Fax: [phone]

## 2016-04-06 NOTE — Telephone Encounter (Signed)
Pt returned my call and he has been made aware of the increase in Hydralazine 50 mg tid and he has been advised he can use 2 of the 25 mg tablets tid, since he had just picked up the 25 mg tablets.  Pt verbalized understanding.

## 2016-04-07 ENCOUNTER — Inpatient Hospital Stay (HOSPITAL_COMMUNITY): Admission: RE | Admit: 2016-04-07 | Payer: Federal, State, Local not specified - PPO | Source: Ambulatory Visit

## 2016-04-12 ENCOUNTER — Ambulatory Visit (INDEPENDENT_AMBULATORY_CARE_PROVIDER_SITE_OTHER): Payer: Federal, State, Local not specified - PPO | Admitting: Physician Assistant

## 2016-04-12 VITALS — BP 154/54 | HR 75 | Ht 68.0 in | Wt 185.4 lb

## 2016-04-12 DIAGNOSIS — I493 Ventricular premature depolarization: Secondary | ICD-10-CM | POA: Diagnosis not present

## 2016-04-12 DIAGNOSIS — I25810 Atherosclerosis of coronary artery bypass graft(s) without angina pectoris: Secondary | ICD-10-CM | POA: Diagnosis not present

## 2016-04-12 DIAGNOSIS — I5032 Chronic diastolic (congestive) heart failure: Secondary | ICD-10-CM

## 2016-04-12 DIAGNOSIS — E785 Hyperlipidemia, unspecified: Secondary | ICD-10-CM

## 2016-04-12 DIAGNOSIS — E118 Type 2 diabetes mellitus with unspecified complications: Secondary | ICD-10-CM

## 2016-04-12 DIAGNOSIS — I10 Essential (primary) hypertension: Secondary | ICD-10-CM

## 2016-04-12 DIAGNOSIS — N189 Chronic kidney disease, unspecified: Secondary | ICD-10-CM | POA: Diagnosis not present

## 2016-04-12 DIAGNOSIS — R5383 Other fatigue: Secondary | ICD-10-CM

## 2016-04-12 LAB — TSH: TSH: 2.84 mIU/L (ref 0.40–4.50)

## 2016-04-12 LAB — BASIC METABOLIC PANEL
BUN: 24 mg/dL (ref 7–25)
CHLORIDE: 99 mmol/L (ref 98–110)
CO2: 23 mmol/L (ref 20–31)
Calcium: 7.8 mg/dL — ABNORMAL LOW (ref 8.6–10.3)
Creat: 1.82 mg/dL — ABNORMAL HIGH (ref 0.70–1.18)
Glucose, Bld: 163 mg/dL — ABNORMAL HIGH (ref 65–99)
POTASSIUM: 4.1 mmol/L (ref 3.5–5.3)
SODIUM: 130 mmol/L — AB (ref 135–146)

## 2016-04-12 NOTE — Patient Instructions (Addendum)
Medication Instructions:  Your physician recommends that you continue on your current medications as directed. Please refer to the Current Medication list given to you today.   Labwork: TODAY:  BMET & TSH  Testing/Procedures: None ordered  Follow-Up: Your physician recommends that you schedule a follow-up appointment in: 3 MONTHS WITH DR. Katrinka Blazing   Any Other Special Instructions Will Be Listed Below (If Applicable).     If you need a refill on your cardiac medications before your next appointment, please call your pharmacy.

## 2016-04-16 NOTE — Addendum Note (Signed)
Addended by: Drue Dun on: 04/16/2016 10:56 AM   Modules accepted: Orders

## 2016-05-03 ENCOUNTER — Telehealth: Payer: Self-pay | Admitting: Physician Assistant

## 2016-05-03 NOTE — Telephone Encounter (Signed)
Patient states that his legs are "weeping".  He has an appointment tomorrow 12/5 with [name], NP, but he would like to speak with a nurse prior to the visit.

## 2016-05-03 NOTE — Telephone Encounter (Signed)
Spoke with patient who states his bilateral leg swelling has improved significantly however he continues to have red rash on bilateral lower extremities and the left lower extremity is weeping clear fluid. He states he is allergic to amlodipine and had this same kind of reaction when he took amlodipine.  States believes this occurred when he started hydralazine and it has improved since he stopped taking it. He states he has the LLE wrapped in sterile gauze with an ace bandage over it since last night and it has not leaked through the ace bandage yet.  He states he tried to call his PCP for an appointment and was unable to be seen until Thursday. He states he also tried to call a dermatology office but no appointment is available for several months. I advised that I will discuss with an APP in our office and call him back with advice.  He verbalized understanding and agreement and thanked me for the call.

## 2016-05-03 NOTE — Telephone Encounter (Signed)
Left message for patient to call back.  Per Edward Boozer, NP patient can be evaluated in our office tomorrow for his concerns.

## 2016-05-04 ENCOUNTER — Encounter: Payer: Self-pay | Admitting: Cardiology

## 2016-05-04 ENCOUNTER — Ambulatory Visit (INDEPENDENT_AMBULATORY_CARE_PROVIDER_SITE_OTHER): Payer: Federal, State, Local not specified - PPO | Admitting: Cardiology

## 2016-05-04 VITALS — BP 142/58 | HR 59 | Ht 68.0 in | Wt 174.0 lb

## 2016-05-04 DIAGNOSIS — I10 Essential (primary) hypertension: Secondary | ICD-10-CM

## 2016-05-04 DIAGNOSIS — Z79899 Other long term (current) drug therapy: Secondary | ICD-10-CM

## 2016-05-04 DIAGNOSIS — L97921 Non-pressure chronic ulcer of unspecified part of left lower leg limited to breakdown of skin: Secondary | ICD-10-CM

## 2016-05-04 DIAGNOSIS — I25810 Atherosclerosis of coronary artery bypass graft(s) without angina pectoris: Secondary | ICD-10-CM

## 2016-05-04 DIAGNOSIS — R600 Localized edema: Secondary | ICD-10-CM | POA: Diagnosis not present

## 2016-05-04 DIAGNOSIS — I5032 Chronic diastolic (congestive) heart failure: Secondary | ICD-10-CM | POA: Diagnosis not present

## 2016-05-04 DIAGNOSIS — N189 Chronic kidney disease, unspecified: Secondary | ICD-10-CM

## 2016-05-04 LAB — CBC WITH DIFFERENTIAL/PLATELET
Basophils Absolute: 0 cells/uL (ref 0–200)
Basophils Relative: 0 %
EOS ABS: 639 {cells}/uL — AB (ref 15–500)
Eosinophils Relative: 9 %
HEMATOCRIT: 32.1 % — AB (ref 38.5–50.0)
Hemoglobin: 10.6 g/dL — ABNORMAL LOW (ref 13.2–17.1)
LYMPHS PCT: 19 %
Lymphs Abs: 1349 cells/uL (ref 850–3900)
MCH: 31.7 pg (ref 27.0–33.0)
MCHC: 33 g/dL (ref 32.0–36.0)
MCV: 96.1 fL (ref 80.0–100.0)
MONO ABS: 639 {cells}/uL (ref 200–950)
MPV: 9.1 fL (ref 7.5–12.5)
Monocytes Relative: 9 %
NEUTROS PCT: 63 %
Neutro Abs: 4473 cells/uL (ref 1500–7800)
Platelets: 188 10*3/uL (ref 140–400)
RBC: 3.34 MIL/uL — ABNORMAL LOW (ref 4.20–5.80)
RDW: 13.6 % (ref 11.0–15.0)
WBC: 7.1 10*3/uL (ref 3.8–10.8)

## 2016-05-04 LAB — BASIC METABOLIC PANEL
BUN: 22 mg/dL (ref 7–25)
CHLORIDE: 96 mmol/L — AB (ref 98–110)
CO2: 26 mmol/L (ref 20–31)
CREATININE: 1.7 mg/dL — AB (ref 0.70–1.18)
Calcium: 7.7 mg/dL — ABNORMAL LOW (ref 8.6–10.3)
GLUCOSE: 267 mg/dL — AB (ref 65–99)
POTASSIUM: 4.7 mmol/L (ref 3.5–5.3)
Sodium: 129 mmol/L — ABNORMAL LOW (ref 135–146)

## 2016-05-04 NOTE — Patient Instructions (Addendum)
Medication Instructions:  Your physician has recommended you make the following change in your medication:  1-Please take 80 mg of Lasix by mouth today only, then continue to take Lasix 40 mg by mouth daily.  Labwork: Your physician recommends that you have lab work today BMET and CBC.  Testing/Procedures: NONE  Follow-Up: Your physician wants you to follow-up in: 2 weeks with Edward Jews PA or Edward Boozer NP.   Your physician recommends that you schedule a follow-up appointment with a dermatologist as soon as possible.  If your Systolic Blood Pressure (SBP) is above 150, please call our office [phone] is the top number reading on your blood pressure.    If you need a refill on your cardiac medications before your next appointment, please call your pharmacy.

## 2016-05-04 NOTE — Progress Notes (Signed)
Cardiology Office Note   Date:  05/04/2016   ID:  [name], DOB 04/26/1941, MRN [id number]  PCP:  Lupita RaiderSHAW,KIMBERLEE, MD  Cardiologist:  Dr. Katrinka BlazingSmith    Chief Complaint  Patient presents with  . Leg Swelling    and draining fluid and blood      History of Present Illness: [name] is a 75 y.o. male who presents for drainage from one leg.    He has a history of CAD s/p CABG x3 (2005), DMT2, HLD, chronic diastolic CHF, anemia of chronic disease, PVCsand CKD   Recent heart cath in 07/2015 showed diffuse disease of native vessels but patent grafts. EF was normal at 55-65%. No PCI was required. Continued medical therapy was recommended. He also has had difficulty with hyponatremia related to diuretic therapy and kidney dysfunction.  Recently with lower ext edema.  This has improved but on hydralazine. His Lt leg began burning and rash along with other legs and back, but Lt lower leg has a superficial ulcer- had been draining serous fluid but now with blood.  He stopped hydralazine and burning and itching resolved.   No chest pain no SOB.  BP is actually doing well. He has seen Dr. Hyman HopesWebb  And was told his kidneys are doing well.  He does have legs are still tight.  He is planning to see dermatologist.    Past Medical History:  Diagnosis Date  . Alcohol abuse   . Arthritis    "just about qwhere now" (08/28/2015)  . Benign prostatic hyperplasia   . CAD (coronary artery disease) 2005   mLAD ~90%, D1/RI-70, mRCA 70% --> CABG 3: LIMA-LAD, SVG-D1/RI, SVG-dRCA  . HTN (hypertension)   . Hyperlipemia   . Iron deficiency anemia   . Myocardial infarction 1995   s/p PTCA  . Obesity   . Peripheral vascular disease (HCC)   . PONV (postoperative nausea and vomiting)   . Type II diabetes mellitus (HCC)     Past Surgical History:  Procedure Laterality Date  . ABDOMINAL HERNIA REPAIR    . CARDIAC CATHETERIZATION  2005   mLAD ~90%, D1/RI-70, mRCA 70%   . CARDIAC CATHETERIZATION N/A  08/29/2015   Procedure: Left Heart Cath and Coronary Angiography;  Surgeon: Marykay Lexavid W Harding, MD;  [facility];  Service: Cardiovascular;  Laterality: N/A;  . CAROTID ENDARTERECTOMY    . CORONARY ANGIOPLASTY WITH STENT PLACEMENT  12/1993; 04/1994  . CORONARY ARTERY BYPASS GRAFT  2005   CABG 3: LIMA-LAD, SVG-D1/RI, SVG-dRCA (Dr.VanTrigt)  . EYE SURGERY Left 1968   "dug hot steel out that was imbedded"   . HERNIA REPAIR    . INGUINAL HERNIA REPAIR Right   . KNEE ARTHROSCOPY Left   . KNEE CARTILAGE SURGERY Right 1960s   "took cartilage out"  . SHOULDER ARTHROSCOPY W/ ROTATOR CUFF REPAIR Left   . SKIN CANCER DESTRUCTION     "6 cut off" (08/28/2015)  . UMBILICAL HERNIA REPAIR       Current Outpatient Prescriptions  Medication Sig Dispense Refill  . aspirin EC 81 MG tablet Take 81 mg by mouth at bedtime.     . Coenzyme Q10 (COQ10) 100 MG CAPS Take 100 mg by mouth daily.    Marland Kitchen. ezetimibe (ZETIA) 10 MG tablet Take 5 mg by mouth daily.    . ferrous sulfate 325 (65 FE) MG tablet Take 325 mg by mouth at bedtime.     . furosemide (LASIX) 40 MG tablet Take  1 tablet (40 mg total) by mouth daily. 33 tablet 2  . metFORMIN (GLUCOPHAGE) 1000 MG tablet Take 1,000 mg by mouth 2 (two) times daily with a meal.    . metoprolol (LOPRESSOR) 100 MG tablet Take 100 mg by mouth 2 (two) times daily.    . niacin (NIASPAN) 1000 MG CR tablet Take 1,000 mg by mouth 2 (two) times daily.    . nitroGLYCERIN (NITROSTAT) 0.4 MG SL tablet Place 0.4 mg under the tongue every 5 (five) minutes as needed for chest pain.     No current facility-administered medications for this visit.     Allergies:   Food; Other; Statins; Hydralazine; Fish oil; and Norvasc [amlodipine]    Social History:  The patient  reports that he quit smoking about 22 years ago. His smoking use included Cigarettes. He has a 52.50 pack-year smoking history. He has never used smokeless tobacco. He reports that he drinks about 16.8 oz of  alcohol per week . He reports that he does not use drugs.   Family History:  The patient's family history includes CAD in his father; Heart attack in his mother.    ROS:  General:no colds or fevers, + weight down 11 lbs from 04/12/16.   Skin: rashes resolved + ulcers Lt lower ext.   HEENT:no blurred vision, no congestion CV:see HPI PUL:see HPI GI:no diarrhea constipation or melena, no indigestion GU:no hematuria, no dysuria UU:VOZD joint pain, no claudication Neuro:no syncope, no lightheadedness Endo:+ diabetes with stable glucose, no thyroid disease  Wt Readings from Last 3 Encounters:  05/04/16 174 lb (78.9 kg)  04/12/16 185 lb 6.4 oz (84.1 kg)  03/31/16 183 lb 12.8 oz (83.4 kg)     PHYSICAL EXAM: VS:  BP (!) 142/58   Pulse (!) 59   Ht  (1.727 m)   Wt 174 lb (78.9 kg)   BMI 26.46 kg/m  , BMI Body mass index is 26.46 kg/m. General:Pleasant affect, NAD Skin:Warm and dry, brisk capillary refill HEENT:normocephalic, sclera clear, mucus membranes moist Neck:supple, no JVD, no bruits  Heart:S1S2 RRR without murmur, gallup, rub or click Lungs:clear without rales, rhonchi, or wheezes Ext:2+ lower ext edema legs are very tight. Lt lower leg with ulceration mildly red. No warmth. 2+ radial pulses Neuro:alert and oriented X 3, MAE, follows commands, + facial symmetry    EKG:  EKG is Not ordered today. The ekg previously SR with freq PVCs.     Recent Labs: 03/26/2016: ALT 19; B Natriuretic Peptide 1,405.5; Hemoglobin 10.9; Platelets 234 04/12/2016: BUN 24; Creat 1.82; Potassium 4.1; Sodium 130; TSH 2.84    Lipid Panel No results found for: CHOL, TRIG, HDL, CHOLHDL, VLDL, LDLCALC, LDLDIRECT     Other studies Reviewed: Additional studies/ records that were reviewed today include: . Additional studies/ records that were reviewed today include:  2D ECHO:08/28/2015  LV EF: 65% - 70% - Left ventricle: The cavity size was normal. Wall thickness was increased in a  pattern of mild LVH. Systolic function was vigorous. The estimated ejection fraction was in the range of 65% to 70%. Wall motion was normal; there were no regional wall motion abnormalities. Doppler parameters are consistent with abnormal left ventricular relaxation (grade 1 diastolic dysfunction). The E/e&' ratio is between 8-15, suggesting indeterminate LV filling pressure. - Mitral valve: Mildly thickened leaflets . There was trivial regurgitation. - Left atrium: The atrium was mildly dilated. - Right ventricle: The cavity size was mildly dilated. Systolic function is reduced. - Right atrium: Severely  dilated at 28 cm2. - Tricuspid valve: There was moderate regurgitation. - Pulmonic valve: The valve appears to be grossly normal. There was mild regurgitation. - Pulmonary arteries: PA peak pressure: 41 mm Hg (S). - Inferior vena cava: The vessel was dilated. The respirophasic diameter changes were blunted (<50%), consistent with elevated central venous pressure. Impressions: - LVEF 65-70%, mild LVH, normal wall motion, diastolic dysfunction, indeterminate LV filling pressure, mild LAE, dilated RV with reduced systolic function, severe RAE, moderate TR, mild PI, RVSP 41 mmHg, dilated IVC.   Procedures 08/29/15 Left Heart Cath and Coronary Angiography  Conclusion   Ost 1st Diag to 1st Diag lesion, 100% stenosed. SVG-DIAG is normal in caliber, and is anatomically normal.  Ost LAD to Prox LAD lesion, 80% stenosed. Prox LAD lesion, 100% stenosed. LIMA-LAD is normal in caliber, and is anatomically normal.  Prox RCA to Mid RCA lesion, 80% stenosed. SVG-dRCA is large and competitive flow minimizes the antegrade flow from the native coronary.  Ost RPDA lesion, 60% stenosed.  The left ventricular systolic function is normal.      ASSESSMENT AND PLAN:   Chronic diastolic CHF:  He has had some worsening LE edema, which I think is in large part 2/2  nephrotic syndrome (recent 24 hour urine with >5g protein.) seen by nephrology, Dr. Hyman Hopes. And was told he was fine.  He will continued lasix 40mg  daily for now.  His edema of lower ext is present though much improved.  Will give one 80 mg lasix today then back to 40 mg daily.  Will get BMET today  Lt lower leg ulcer, occurred with hydralazine with rash of legs and back, they have resolved with stopping hydralazine. He will see dermatologist for recommendations.   Will check CBC esp WBC.  Leg redressed here in office.   HTN: BP much better controlled on Lopressor 100mg  BID and despite stopping hydralazine. He will keep a record and if systolic stays above 150 he will call us.  --  Intolerant to amlodipine. He says he has had been on an ACE/ARB in the past complicated by hyperkalemia.  CAD s/p CABG: no angina. Continue ASA and BB. Intolerant to statins. Recent cath 07/2015 with no interventions and continued medical therapy  CKD: creat 1.97 on 04/02/16. Improved from 2.45 Will order a BMET today  PVCs: asymptomatic and 2D ECHO in 07/2015 with normal LV function   He will follow up in 2 weeks to eval leg and BP.  Current medicines are reviewed with the patient today.  The patient Has no concerns regarding medicines.  The following changes have been made:  See above Labs/ tests ordered today include:see above  Disposition:   FU:  see above  Signed, [name], NP  05/04/2016 12:14 PM    [facility] [address] [place], [place], [place]  [address]/ [address] [place], [place] Phone: [phone]; Fax: [phone]  [phone]

## 2016-05-04 NOTE — Telephone Encounter (Signed)
I spoke with patient and confirmed appt for today at 1130 with [name], NP. He states he will be here.

## 2016-05-06 ENCOUNTER — Telehealth: Payer: Self-pay | Admitting: *Deleted

## 2016-05-06 DIAGNOSIS — I10 Essential (primary) hypertension: Secondary | ICD-10-CM

## 2016-05-06 NOTE — Telephone Encounter (Signed)
Pt aware of his lab results. He has been advised to hold fluids to 1200 ml daily and recheck bmet 05/11/16. Pt said to let you know that his Dermatologist couldn't see him until mid January. Order for lab in [facility]. Pt verbalized understanding.

## 2016-05-11 ENCOUNTER — Other Ambulatory Visit: Payer: Federal, State, Local not specified - PPO

## 2016-05-12 ENCOUNTER — Encounter: Payer: Self-pay | Admitting: Physician Assistant

## 2016-05-13 NOTE — Progress Notes (Signed)
Cardiology Office Note    Date:  05/18/2016   ID:  Edward, Stein 1940-07-26, MRN [id number]  PCP:  Edward Raider, MD  Cardiologist:  Dr. Katrinka Stein   CC: follow up  History of Present Illness:  [name] is a 75 y.o. male with a history of CAD s/p CABG x3 (2005), DMT2, HLD, chronic diastolic CHF, anemia of chronic disease, PVCs and CKD who presents to clinic for follow up.   Recent heart cath in 07/2015 showed diffuse disease of native vessels but patent grafts. EF was normal at 55-65%. No PCI was required. Continued medical therapy was recommended. He also has had difficulty with hyponatremia related to diuretic therapy and kidney dysfunction.  He saw Dr. Katrinka Stein in 01/2016 and complained of exertional LE pain. Follow up ABIs were normal.  He called into the office on 03/26/16 for swelling and SOB. BNP was ordered which was 1400. Ddimer mildly elevated but follow up CTA negative for PE. UA showed 3+ protein and low albumin. Dr. Katrinka Stein felt kidney disease may be contributing to leg edema and needed a 24 hour urine for total protein to r/o nephrotic syndrome.  He was seen in the [facility] ER on 03/27/16 for LE edema. BP was also quite elevated to 211/60. LE dopplers were negative for DVT. His lasix was increased from 40mg  daily to 80mg  daily for x3 days. Weight was 190 lbs.   I saw him in clinic on 03/31/16 for follow up. His volume status was better but still not at baseline. I instructed him to increase lasix 40mg  daily to 80mg  x 3 days. However, a follow up BMET showed AKI with creat 1.44--> 2.45 and I advised him to go back to 40mg  daily. He did a 24 hour urine which showed >5 g of protein in his urine. I have asked him to see nephrology. His BP was also high at that appointment ( 188/60). I added hydralazine 25mg  TID. He kept a log of BPs and they remained high. I further increased this to 50mg  TID.  He saw Edward Boozer NP on 05/04/16 for LE draining and he was given one dose of lasix  80mg . His hydralazine was stopped 2/2 rash. His BP looked okay on Lopressor 100mg  BID alone.  Today he presents to clinic for follow up. Feeling really good today; the best he has felt in a while. Saw a dermatologist and cream really helping rash. No chest pain or SOB. Chronic LE edema but much better. No orthopnea or PND. No dizziness or syncope. No palpitations. No blood in stool or urine.     Past Medical History:  Diagnosis Date  . Alcohol abuse   . Arthritis    "just about qwhere now" (08/28/2015)  . Benign prostatic hyperplasia   . CAD (coronary artery disease) 2005   mLAD ~90%, D1/RI-70, mRCA 70% --> CABG 3: LIMA-LAD, SVG-D1/RI, SVG-dRCA  . HTN (hypertension)   . Hyperlipemia   . Iron deficiency anemia   . Myocardial infarction 1995   s/p PTCA  . Obesity   . Peripheral vascular disease (HCC)   . PONV (postoperative nausea and vomiting)   . Type II diabetes mellitus (HCC)     Past Surgical History:  Procedure Laterality Date  . ABDOMINAL HERNIA REPAIR    . CARDIAC CATHETERIZATION  2005   mLAD ~90%, D1/RI-70, mRCA 70%   . CARDIAC CATHETERIZATION N/A 08/29/2015   Procedure: Left Heart Cath and Coronary Angiography;  Surgeon: Marykay Lex, MD;  Location: [facility] INVASIVE CV LAB;  Service: Cardiovascular;  Laterality: N/A;  . CAROTID ENDARTERECTOMY    . CORONARY ANGIOPLASTY WITH STENT PLACEMENT  12/1993; 04/1994  . CORONARY ARTERY BYPASS GRAFT  2005   CABG 3: LIMA-LAD, SVG-D1/RI, SVG-dRCA (Dr.VanTrigt)  . EYE SURGERY Left 1968   "dug hot steel out that was imbedded"   . HERNIA REPAIR    . INGUINAL HERNIA REPAIR Right   . KNEE ARTHROSCOPY Left   . KNEE CARTILAGE SURGERY Right 1960s   "took cartilage out"  . SHOULDER ARTHROSCOPY W/ ROTATOR CUFF REPAIR Left   . SKIN CANCER DESTRUCTION     "6 cut off" (08/28/2015)  . UMBILICAL HERNIA REPAIR      Current Medications: Outpatient Medications Prior to Visit  Medication Sig Dispense Refill  . aspirin EC 81 MG tablet Take 81  mg by mouth at bedtime.     . Coenzyme Q10 (COQ10) 100 MG CAPS Take 100 mg by mouth daily.    Marland Kitchen ezetimibe (ZETIA) 10 MG tablet Take 5 mg by mouth daily.    . ferrous sulfate 325 (65 FE) MG tablet Take 325 mg by mouth at bedtime.     . furosemide (LASIX) 40 MG tablet Take 1 tablet (40 mg total) by mouth daily. 33 tablet 2  . metFORMIN (GLUCOPHAGE) 1000 MG tablet Take 1,000 mg by mouth 2 (two) times daily with a meal.    . metoprolol (LOPRESSOR) 100 MG tablet Take 100 mg by mouth 2 (two) times daily.    . niacin (NIASPAN) 1000 MG CR tablet Take 1,000 mg by mouth 2 (two) times daily.    . nitroGLYCERIN (NITROSTAT) 0.4 MG SL tablet Place 0.4 mg under the tongue every 5 (five) minutes as needed for chest pain.     No facility-administered medications prior to visit.      Allergies:   Food; Other; Statins; Hydralazine; Fish oil; and Norvasc [amlodipine]   Social History   Social History  . Marital status: Married    Spouse name: N/A  . Number of children: N/A  . Years of education: N/A   Social History Main Topics  . Smoking status: Former Smoker    Packs/day: 1.50    Years: 35.00    Types: Cigarettes    Quit date: 01/08/1994  . Smokeless tobacco: Never Used  . Alcohol use 16.8 oz/week    28 Cans of beer per week     Comment: 3-5 beers everyday.  . Drug use: No  . Sexual activity: Not Currently   Other Topics Concern  . None   Social History Narrative  . None     Family History:  The patient's family history includes CAD in his father; Heart attack in his mother.     ROS:   Please see the history of present illness.    ROS  All other systems reviewed and are negative.   PHYSICAL EXAM:   VS:  BP (!) 190/70   Pulse 80   Ht  (1.727 m)   Wt 176 lb 12.8 oz (80.2 kg)   BMI 26.88 kg/m    GEN: Well nourished, well developed, in no acute distress  HEENT: normal  Neck: no JVD, carotid bruits, or masses Cardiac: RRR; no murmurs, rubs, or gallops, 1+ LE  edema    Respiratory:  clear to auscultation bilaterally, normal work of breathing GI: soft, nontender, nondistended, + BS MS: no deformity or atrophy  Skin: warm and dry, no rash Neuro:  Alert and Oriented x 3, Strength and sensation are intact Psych: euthymic mood, full affect  Wt Readings from Last 3 Encounters:  05/18/16 176 lb 12.8 oz (80.2 kg)  05/04/16 174 lb (78.9 kg)  04/12/16 185 lb 6.4 oz (84.1 kg)      Studies/Labs Reviewed:   EKG:  EKG is NOT ordered today.    Recent Labs: 03/26/2016: ALT 19; B Natriuretic Peptide 1,405.5 04/12/2016: TSH 2.84 05/04/2016: BUN 22; Creat 1.70; Hemoglobin 10.6; Platelets 188; Potassium 4.7; Sodium 129   Lipid Panel No results found for: CHOL, TRIG, HDL, CHOLHDL, VLDL, LDLCALC, LDLDIRECT  Additional studies/ records that were reviewed today include:  2D ECHO: 08/28/2015  LV EF: 65% - 70% - Left ventricle: The cavity size was normal. Wall thickness was increased in a pattern of mild LVH. Systolic function was vigorous. The estimated ejection fraction was in the range of 65% to 70%. Wall motion was normal; there Dictation #1 [id number]  [id number] were no regional wall motion abnormalities. Doppler parameters are consistent with abnormal left ventricular relaxation (grade 1 diastolic dysfunction). The E/e&' ratio is between 8-15, suggesting indeterminate LV filling pressure. - Mitral valve: Mildly thickened leaflets . There was trivial regurgitation. - Left atrium: The atrium was mildly dilated. - Right ventricle: The cavity size was mildly dilated. Systolic function is reduced. - Right atrium: Severely dilated at 28 cm2. - Tricuspid valve: There was moderate regurgitation. - Pulmonic valve: The valve appears to be grossly normal. There was mild regurgitation. - Pulmonary arteries: PA peak pressure: 41 mm Hg (S). - Inferior vena cava: The vessel was dilated. The respirophasic diameter changes were blunted  (<50%), consistent with elevated central venous pressure. Impressions: - LVEF 65-70%, mild LVH, normal wall motion, diastolic dysfunction, indeterminate LV filling pressure, mild LAE, dilated RV with reduced systolic function, severe RAE, moderate TR, mild PI, RVSP 41 mmHg, dilated IVC.   Procedures 08/29/15 Left Heart Cath and Coronary Angiography  Conclusion   Ost 1st Diag to 1st Diag lesion, 100% stenosed. SVG-DIAG is normal in caliber, and is anatomically normal.  Ost LAD to Prox LAD lesion, 80% stenosed. Prox LAD lesion, 100% stenosed. LIMA-LAD is normal in caliber, and is anatomically normal.  Prox RCA to Mid RCA lesion, 80% stenosed. SVG-dRCA is large and competitive flow minimizes the antegrade flow from the native coronary.  Ost RPDA lesion, 60% stenosed.  The left ventricular systolic function is normal.      ASSESSMENT & PLAN:   Chronic diastolic CHF:  He has had some worsening LE edema, which I think is in large part 2/2 nephrotic syndrome (recent 24 hour urine with >5g protein.) Continue lasix 40mg  daily for now. Legs look a lot better but will with some LE edema. Will rx compression stockings.  Will get BMET today. Sees Dr. Hyman Hopes with nephrology.   HTN: BP high today but hasn't taken this Lopressor today. He says at home BP has been running good. Continue Lopressor 100mg  BID. He is intolerant to amlodipine. He says he has had been on an ACE/ARB in the past complicated by hyperkalemia.Marland Kitchen Hydralazine caused a rash    CAD s/p CABG: no angina. Continue ASA and BB. Intolerant to statins. Recent cath 07/2015 with no interventions and continued medical therapy  CKD: creat 1.97 on 04/02/16. Will order a BMET today  HLD: intolerant to statins. Last LDL 81 in 01/2016. Followed by PCP  DMT2: last HgA1c 6.4 in 01/2016. Continue current regimen    PVCs: asymptomatic and 2D ECHO  in 07/2015 with normal LV function    Medication Adjustments/Labs and Tests  Ordered: Current medicines are reviewed at length with the patient today.  Concerns regarding medicines are outlined above.  Medication changes, Labs and Tests ordered today are listed in the Patient Instructions below. Patient Instructions  Medication Instructions:  Your physician recommends that you continue on your current medications as directed. Please refer to the Current Medication list given to you today.   Labwork: BMET  Testing/Procedures: None ordered  Follow-Up: Your physician recommends that you schedule a follow-up appointment in: KEEP YOUR APPOINTMENT WITH DR. Katrinka BlazingSMITH   Any Other Special Instructions Will Be Listed Below (If Applicable).     If you need a refill on your cardiac medications before your next appointment, please call your pharmacy.      Signed, Cline CrockKathryn Marlyne Totaro, PA-C  05/18/2016 9:47 AM    [facility] [address], [place], [place]  [id number] Phone: [phone]; Fax: [phone]

## 2016-05-18 ENCOUNTER — Encounter: Payer: Self-pay | Admitting: Physician Assistant

## 2016-05-18 ENCOUNTER — Ambulatory Visit (INDEPENDENT_AMBULATORY_CARE_PROVIDER_SITE_OTHER): Payer: Federal, State, Local not specified - PPO | Admitting: Physician Assistant

## 2016-05-18 VITALS — BP 190/70 | HR 80 | Ht 68.0 in | Wt 176.8 lb

## 2016-05-18 DIAGNOSIS — I2510 Atherosclerotic heart disease of native coronary artery without angina pectoris: Secondary | ICD-10-CM

## 2016-05-18 DIAGNOSIS — E118 Type 2 diabetes mellitus with unspecified complications: Secondary | ICD-10-CM

## 2016-05-18 DIAGNOSIS — I5032 Chronic diastolic (congestive) heart failure: Secondary | ICD-10-CM | POA: Diagnosis not present

## 2016-05-18 DIAGNOSIS — E785 Hyperlipidemia, unspecified: Secondary | ICD-10-CM

## 2016-05-18 DIAGNOSIS — I10 Essential (primary) hypertension: Secondary | ICD-10-CM

## 2016-05-18 DIAGNOSIS — I493 Ventricular premature depolarization: Secondary | ICD-10-CM

## 2016-05-18 DIAGNOSIS — N049 Nephrotic syndrome with unspecified morphologic changes: Secondary | ICD-10-CM

## 2016-05-18 DIAGNOSIS — N189 Chronic kidney disease, unspecified: Secondary | ICD-10-CM

## 2016-05-18 DIAGNOSIS — I110 Hypertensive heart disease with heart failure: Secondary | ICD-10-CM | POA: Diagnosis not present

## 2016-05-18 LAB — BASIC METABOLIC PANEL
BUN: 21 mg/dL (ref 7–25)
CALCIUM: 7.6 mg/dL — AB (ref 8.6–10.3)
CO2: 27 mmol/L (ref 20–31)
Chloride: 99 mmol/L (ref 98–110)
Creat: 1.45 mg/dL — ABNORMAL HIGH (ref 0.70–1.18)
GLUCOSE: 144 mg/dL — AB (ref 65–99)
Potassium: 4 mmol/L (ref 3.5–5.3)
SODIUM: 134 mmol/L — AB (ref 135–146)

## 2016-05-18 NOTE — Addendum Note (Signed)
Addended by: Tonita Phoenix on: 05/18/2016 09:48 AM   Modules accepted: Orders

## 2016-05-18 NOTE — Patient Instructions (Addendum)
Medication Instructions:  Your physician recommends that you continue on your current medications as directed. Please refer to the Current Medication list given to you today.   Labwork: BMET  Testing/Procedures: None ordered  Follow-Up: Your physician recommends that you schedule a follow-up appointment in: KEEP YOUR APPOINTMENT WITH DR. Katrinka Blazing   Any Other Special Instructions Will Be Listed Below (If Applicable).     If you need a refill on your cardiac medications before your next appointment, please call your pharmacy.

## 2016-07-18 NOTE — Progress Notes (Signed)
Cardiology Office Note    Date:  07/19/2016   ID:  Adib, Wahba 10-16-1940, MRN [id number]  PCP:  Lupita Raider, MD  Cardiologist: Lesleigh Noe, MD   Chief Complaint  Patient presents with  . Edema  . Coronary Artery Disease  . Leg Swelling    History of Present Illness:  [name] is a 76 y.o. male male  CAD, prior coronary bypass grafting, significant native vessel disease, diastolic dysfunction, and chronic kidney disease stage III.  He has been seen by nephrology,  Dr. Elvis Coil. According to the patient, "my kidneys are okay". This is unlikely. 5.5 g of protein was noted on a 24-hour urine collection within the past 6 months. Serum albumin is 2.2. Aggressive diuretic therapy lead to hyponatremia. He is now somewhat improved and can tolerate only 40 mg of furosemide daily.  The edema state was evaluated initially by heart evaluation including an echocardiogram and coronary angiography as outlined below. No significant abnormalities were noted.  Past Medical History:  Diagnosis Date  . Alcohol abuse   . Arthritis    "just about qwhere now" (08/28/2015)  . Benign prostatic hyperplasia   . CAD (coronary artery disease) 2005   mLAD ~90%, D1/RI-70, mRCA 70% --> CABG 3: LIMA-LAD, SVG-D1/RI, SVG-dRCA  . HTN (hypertension)   . Hyperlipemia   . Iron deficiency anemia   . Myocardial infarction 1995   s/p PTCA  . Obesity   . Peripheral vascular disease (HCC)   . PONV (postoperative nausea and vomiting)   . Type II diabetes mellitus (HCC)     Past Surgical History:  Procedure Laterality Date  . ABDOMINAL HERNIA REPAIR    . CARDIAC CATHETERIZATION  2005   mLAD ~90%, D1/RI-70, mRCA 70%   . CARDIAC CATHETERIZATION N/A 08/29/2015   Procedure: Left Heart Cath and Coronary Angiography;  Surgeon: Marykay Lex, MD;  Location: [facility] INVASIVE CV LAB;  Service: Cardiovascular;  Laterality: N/A;  . CAROTID ENDARTERECTOMY    . CORONARY ANGIOPLASTY WITH STENT  PLACEMENT  12/1993; 04/1994  . CORONARY ARTERY BYPASS GRAFT  2005   CABG 3: LIMA-LAD, SVG-D1/RI, SVG-dRCA (Dr.VanTrigt)  . EYE SURGERY Left 1968   "dug hot steel out that was imbedded"   . HERNIA REPAIR    . INGUINAL HERNIA REPAIR Right   . KNEE ARTHROSCOPY Left   . KNEE CARTILAGE SURGERY Right 1960s   "took cartilage out"  . SHOULDER ARTHROSCOPY W/ ROTATOR CUFF REPAIR Left   . SKIN CANCER DESTRUCTION     "6 cut off" (08/28/2015)  . UMBILICAL HERNIA REPAIR      Current Medications: Outpatient Medications Prior to Visit  Medication Sig Dispense Refill  . aspirin EC 81 MG tablet Take 81 mg by mouth at bedtime.     . Coenzyme Q10 (COQ10) 100 MG CAPS Take 100 mg by mouth daily.    Marland Kitchen ezetimibe (ZETIA) 10 MG tablet Take 5 mg by mouth daily.    . ferrous sulfate 325 (65 FE) MG tablet Take 325 mg by mouth at bedtime.     . furosemide (LASIX) 40 MG tablet Take 1 tablet (40 mg total) by mouth daily. 33 tablet 2  . metFORMIN (GLUCOPHAGE) 1000 MG tablet Take 1,000 mg by mouth 2 (two) times daily with a meal.    . metoprolol (LOPRESSOR) 100 MG tablet Take 100 mg by mouth 2 (two) times daily.    . niacin (NIASPAN) 1000 MG CR tablet Take 1,000 mg by  mouth 2 (two) times daily.    . nitroGLYCERIN (NITROSTAT) 0.4 MG SL tablet Place 0.4 mg under the tongue every 5 (five) minutes as needed for chest pain.     No facility-administered medications prior to visit.      Allergies:   Food; Other; Statins; Hydralazine; Fish oil; and Norvasc [amlodipine]   Social History   Social History  . Marital status: Married    Spouse name: N/A  . Number of children: N/A  . Years of education: N/A   Social History Main Topics  . Smoking status: Former Smoker    Packs/day: 1.50    Years: 35.00    Types: Cigarettes    Quit date: 01/08/1994  . Smokeless tobacco: Never Used  . Alcohol use 16.8 oz/week    28 Cans of beer per week     Comment: 3-5 beers everyday.  . Drug use: No  . Sexual activity: Not  Currently   Other Topics Concern  . None   Social History Narrative  . None     Family History:  The patient's family history includes CAD in his father; Heart attack in his mother.   ROS:   Please see the history of present illness.    Leg swelling is improved. Back pain, leg weakness, easy bruising, occasional bleeding from skin lesions related to edema.  All other systems reviewed and are negative.   PHYSICAL EXAM:   VS:  BP (!) 164/66 (BP Location: Left Arm)   Pulse (!) 57   Ht 5\' 8"  (1.727 m)   Wt 178 lb (80.7 kg)   BMI 27.06 kg/m    GEN: Well nourished, well developed, in no acute distress  HEENT: normal  Neck: no JVD, carotid bruits, or masses Cardiac: RRR; no murmurs, rubs, or gallops,no edema  Respiratory:  clear to auscultation bilaterally, normal work of breathing GI: soft, nontender, nondistended, + BS MS: no deformity or atrophy  Skin: warm and dry, no rash Neuro:  Alert and Oriented x 3, Strength and sensation are intact Psych: euthymic mood, full affect  Wt Readings from Last 3 Encounters:  07/19/16 178 lb (80.7 kg)  05/18/16 176 lb 12.8 oz (80.2 kg)  05/04/16 174 lb (78.9 kg)      Studies/Labs Reviewed:   EKG:  EKG  Not done.  Recent Labs: 03/26/2016: ALT 19; B Natriuretic Peptide 1,405.5 04/12/2016: TSH 2.84 05/04/2016: Hemoglobin 10.6; Platelets 188 05/18/2016: BUN 21; Creat 1.45; Potassium 4.0; Sodium 134   Lipid Panel No results found for: CHOL, TRIG, HDL, CHOLHDL, VLDL, LDLCALC, LDLDIRECT  Additional studies/ records that were reviewed today include:  Coronary Angiography 2017: Diagnostic Diagram      Echocardiogram, 08/28/15: Study Conclusions  - Left ventricle: The cavity size was normal. Wall thickness was   increased in a pattern of mild LVH. Systolic function was   vigorous. The estimated ejection fraction was in the range of 65%   to 70%. Wall motion was normal; there were no regional wall   motion abnormalities. Doppler  parameters are consistent with   abnormal left ventricular relaxation (grade 1 diastolic   dysfunction). The E/e&' ratio is between 8-15, suggesting   indeterminate LV filling pressure. - Mitral valve: Mildly thickened leaflets . There was trivial   regurgitation. - Left atrium: The atrium was mildly dilated. - Right ventricle: The cavity size was mildly dilated. Systolic   function is reduced. - Right atrium: Severely dilated at 28 cm2. - Tricuspid valve: There was moderate regurgitation. -  Pulmonic valve: The valve appears to be grossly normal. There was   mild regurgitation. - Pulmonary arteries: PA peak pressure: 41 mm Hg (S). - Inferior vena cava: The vessel was dilated. The respirophasic   diameter changes were blunted (< 50%), consistent with elevated   central venous pressure.  Impressions:  - LVEF 65-70%, mild LVH, normal wall motion, diastolic dysfunction,   indeterminate LV filling pressure, mild LAE, dilated RV with   reduced systolic function, severe RAE, moderate TR, mild PI, RVSP   41 mmHg, dilated IVC.   ASSESSMENT:    1. Coronary artery disease involving coronary bypass graft of native heart without angina pectoris   2. Chronic diastolic heart failure (HCC)   3. Essential hypertension   4. Nephrotic syndrome   5. Other hyperlipidemia   6. Type 2 diabetes mellitus with vascular disease (HCC)      PLAN:  In order of problems listed above:  1. Stable without angina and with recent documentation of patent bypass grafts. 2. Volume overload secondary to nephrotic syndrome. There is no evidence of decompensated diastolic heart failure. 3. Blood pressure is elevated. My thought would be to use a higher dose of diuretic. This is not possible however because he develops hyponatremia. 4. Nephrotic syndrome being followed by Dr. Elvis Coil. Serum albumin running less than 2.5 and 5.5 g of urine per 24 hours documented. This was again discussed with the patient who  feels that there is no kidney issue contributing to his edema state.  No specific adjustments in current medical regimen as far as the heart is concerned. I will review the records from Dr. Helene Kelp to make sure he has seen the data related to 24-hour urine protein. I will otherwise see him back in 9-12 months.    Medication Adjustments/Labs and Tests Ordered: Current medicines are reviewed at length with the patient today.  Concerns regarding medicines are outlined above.  Medication changes, Labs and Tests ordered today are listed in the Patient Instructions below. Patient Instructions  Medication Instructions:  None  Labwork: None  Testing/Procedures: None  Follow-Up: Your physician wants you to follow-up in: 9-12 months with Dr. Katrinka Blazing.  You will receive a reminder letter in the mail two months in advance. If you don't receive a letter, please call our office to schedule the follow-up appointment.   Any Other Special Instructions Will Be Listed Below (If Applicable).     If you need a refill on your cardiac medications before your next appointment, please call your pharmacy.      Signed, Lesleigh Noe, MD  07/19/2016 12:44 PM    [facility] [address] [place], [place], [place]  [address] Phone: [phone]; Fax: [phone]

## 2016-07-19 ENCOUNTER — Ambulatory Visit (INDEPENDENT_AMBULATORY_CARE_PROVIDER_SITE_OTHER): Payer: No Typology Code available for payment source | Admitting: Interventional Cardiology

## 2016-07-19 ENCOUNTER — Encounter: Payer: Self-pay | Admitting: Interventional Cardiology

## 2016-07-19 VITALS — BP 164/66 | HR 57 | Ht 68.0 in | Wt 178.0 lb

## 2016-07-19 DIAGNOSIS — I10 Essential (primary) hypertension: Secondary | ICD-10-CM | POA: Diagnosis not present

## 2016-07-19 DIAGNOSIS — E1159 Type 2 diabetes mellitus with other circulatory complications: Secondary | ICD-10-CM

## 2016-07-19 DIAGNOSIS — I25810 Atherosclerosis of coronary artery bypass graft(s) without angina pectoris: Secondary | ICD-10-CM

## 2016-07-19 DIAGNOSIS — N049 Nephrotic syndrome with unspecified morphologic changes: Secondary | ICD-10-CM

## 2016-07-19 DIAGNOSIS — E7849 Other hyperlipidemia: Secondary | ICD-10-CM

## 2016-07-19 DIAGNOSIS — I5032 Chronic diastolic (congestive) heart failure: Secondary | ICD-10-CM

## 2016-07-19 DIAGNOSIS — E784 Other hyperlipidemia: Secondary | ICD-10-CM

## 2016-07-19 NOTE — Patient Instructions (Signed)
Medication Instructions:  None  Labwork: None  Testing/Procedures: None  Follow-Up: Your physician wants you to follow-up in: 9-12 months with Dr. Smith.  You will receive a reminder letter in the mail two months in advance. If you don't receive a letter, please call our office to schedule the follow-up appointment.   Any Other Special Instructions Will Be Listed Below (If Applicable).     If you need a refill on your cardiac medications before your next appointment, please call your pharmacy.   

## 2016-09-08 ENCOUNTER — Encounter: Payer: Self-pay | Admitting: Cardiology

## 2016-09-08 ENCOUNTER — Ambulatory Visit (INDEPENDENT_AMBULATORY_CARE_PROVIDER_SITE_OTHER): Payer: No Typology Code available for payment source | Admitting: Cardiology

## 2016-09-08 VITALS — BP 168/80 | HR 74 | Ht 68.0 in | Wt 170.0 lb

## 2016-09-08 DIAGNOSIS — M545 Low back pain, unspecified: Secondary | ICD-10-CM

## 2016-09-08 NOTE — Progress Notes (Signed)
09/08/2016 [name]   23-Nov-1940  [id number]  Primary Physician Lupita Raider, MD Primary Cardiologist: Dr. Katrinka Blazing    Reason for Visit/CC: LBP and abdominal pain   HPI:  [name] is a 76 y.o. male, followed by Dr. Katrinka Blazing, with a h/oCAD, prior coronary bypass grafting, significant native vessel disease, diastolic dysfunction, and chronic kidney disease stage III/ nephrotic syndrome, which is followed by Dr. Elvis Coil. He has a h/o severe hyponatremia with high dose diuretics. For this reason, along with his CKD, high doses of diuretics have been avoided. He has been able to tolerate 40 mg of Lasix daily however. His last LHC was 08/29/15 which showed 3/3 patent grafts (LIMA-LAD, SVG-Diag and SVG -dRCA). 2D echo 08/28/15 showed normal LVEF at 65-70% with diastolic dysfunction.   He was last seen by Dr. Katrinka Blazing 07/19/16 and was stable from a cardiac standpoint, w/o symptoms of angina. He was noted to be volume overloaded but this was felt secondary to his nephrotic syndrome. Per Dr. Michaelle Copas note, there was "no evidence of decompensated diastolic heart failure". He was advised to f/u with Dr. Hyman Hopes. Dr. Katrinka Blazing ordered for him to return to our office in 9-12 months.  The patient presents back early with a new complaint of LBP and abdominal discomfort. He wasn't able to get an appt soon enough with his PCP so requested to be seen in our office. Symptoms started 3 days ago. He has never felt this before. Feels like a dull ache in his low back and abdomen. He denies fever, chills, n/v/d. No constipation. He has felt unwell the last several days and spent most of the day in bed yesterday. He denies any heavy lifting or straining. He also denies CP. No dyspnea. He is a former smoker x 35 years but quit in 1995.    Current Meds  Medication Sig  . aspirin EC 81 MG tablet Take 81 mg by mouth at bedtime.   Marland Kitchen ezetimibe (ZETIA) 10 MG tablet Take 5 mg by mouth daily.  . ferrous sulfate 325 (65 FE) MG  tablet Take 325 mg by mouth at bedtime.   . metoprolol (LOPRESSOR) 100 MG tablet Take 100 mg by mouth 2 (two) times daily.  . nitroGLYCERIN (NITROSTAT) 0.4 MG SL tablet Place 0.4 mg under the tongue every 5 (five) minutes as needed for chest pain.   Allergies  Allergen Reactions  . 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   . Other Swelling, Rash and Other (See Comments)    Pt states that he is allergic to all -mycins.   Reaction:  Leg swelling   . Statins Other (See Comments)    Reaction:  Muscle pain   . Hydralazine Dermatitis  . Fish Oil Hives  . Norvasc [Amlodipine] Swelling and Rash    Reaction:  Leg swelling    Past Medical History:  Diagnosis Date  . Alcohol abuse   . Arthritis    "just about qwhere now" (08/28/2015)  . Benign prostatic hyperplasia   . CAD (coronary artery disease) 2005   mLAD ~90%, D1/RI-70, mRCA 70% --> CABG 3: LIMA-LAD, SVG-D1/RI, SVG-dRCA  . HTN (hypertension)   . Hyperlipemia   . Iron deficiency anemia   . Myocardial infarction 1995   s/p PTCA  . Obesity   . Peripheral vascular disease (HCC)   . PONV (postoperative nausea and vomiting)   . Type II diabetes mellitus (HCC)  Family History  Problem Relation Age of Onset  . Heart attack Mother   . CAD Father    Past Surgical History:  Procedure Laterality Date  . ABDOMINAL HERNIA REPAIR    . CARDIAC CATHETERIZATION  2005   mLAD ~90%, D1/RI-70, mRCA 70%   . CARDIAC CATHETERIZATION N/A 08/29/2015   Procedure: Left Heart Cath and Coronary Angiography;  Surgeon: Marykay Lex, MD;  Location: [facility] INVASIVE CV LAB;  Service: Cardiovascular;  Laterality: N/A;  . CAROTID ENDARTERECTOMY    . CORONARY ANGIOPLASTY WITH STENT PLACEMENT  12/1993; 04/1994  . CORONARY ARTERY BYPASS GRAFT  2005   CABG 3: LIMA-LAD, SVG-D1/RI, SVG-dRCA (Dr.VanTrigt)  . EYE SURGERY Left 1968   "dug hot steel out that was imbedded"   . HERNIA REPAIR    . INGUINAL HERNIA  REPAIR Right   . KNEE ARTHROSCOPY Left   . KNEE CARTILAGE SURGERY Right 1960s   "took cartilage out"  . SHOULDER ARTHROSCOPY W/ ROTATOR CUFF REPAIR Left   . SKIN CANCER DESTRUCTION     "6 cut off" (08/28/2015)  . UMBILICAL HERNIA REPAIR     Social History   Social History  . Marital status: Married    Spouse name: N/A  . Number of children: N/A  . Years of education: N/A   Occupational History  . Not on file.   Social History Main Topics  . Smoking status: Former Smoker    Packs/day: 1.50    Years: 35.00    Types: Cigarettes    Quit date: 01/08/1994  . Smokeless tobacco: Never Used  . Alcohol use 16.8 oz/week    28 Cans of beer per week     Comment: 3-5 beers everyday.  . Drug use: No  . Sexual activity: Not Currently   Other Topics Concern  . Not on file   Social History Narrative  . No narrative on file     Review of Systems: General: negative for chills, fever, night sweats or weight changes.  Cardiovascular: negative for chest pain, dyspnea on exertion, edema, orthopnea, palpitations, paroxysmal nocturnal dyspnea or shortness of breath Dermatological: negative for rash Respiratory: negative for cough or wheezing Urologic: negative for hematuria Abdominal: negative for nausea, vomiting, diarrhea, bright red blood per rectum, melena, or hematemesis Neurologic: negative for visual changes, syncope, or dizziness All other systems reviewed and are otherwise negative except as noted above.   Physical Exam:  Blood pressure (!) 168/80, pulse 74, height  (1.727 m), weight 170 lb (77.1 kg), SpO2 95 %.  General appearance: alert, cooperative and no distress Neck: no carotid bruit and no JVD Lungs: clear to auscultation bilaterally Heart: regular rate and rhythm, S1, S2 normal, no murmur, click, rub or gallop Abdomen: soft, mild diffuse tenderness, + BS x 4 quadrants, right sided abdominal bruit noted w/ slight pulsation Extremities: no LEE Pulses: 2+ and  symmetric Skin: Skin color, texture, turgor normal. No rashes or lesions Neurologic: Grossly normal  EKG not performed  ASSESSMENT AND PLAN:   1. Low Back Pain/ Abdominal Pain: 76 y/o male with prior h/o tobacco use x 35 yrs (quit in 1995) + HTN. Abdominal exam notable for right sided bruit in mid abdomen w/ slight pulsation. However no palpable masses. He has mild diffuse tenderness with palpation. Given his low back and abdominal pain + his risk factors, we will order an abdominal ultrasound to r/o AAA. If normal, I advise f/u with PCP.  2. CAD: stable w/o CP or dyspnea. LHC 07/2015  with 3/3 patent grafts. Continue medical therapy.   3. Chronic Diastolic HF: euvolemic on physical exam.   4. HTN: elevated but patient did not take metoprolol today and missed yesterday's dose. We discussed importance of full compliance. He plans on taking his metoprolol when he leaves the office. Continue 100 mg BID. He has f/u with his PCP tomorrow and will get a repeat BP check. May need addition of another agent if still elevated. He reports PCP recently took him off of Lasix. They will reassess this tomorrow.   CKD w/ Nephrotic Syndrome: stable. Followed by Dr. Hyman Hopes.   Usiel Astarita Delmer Islam, MHS [facility] 09/08/2016 8:51 AM

## 2016-09-08 NOTE — Patient Instructions (Addendum)
Schedule abdominal ultra sound to evaluate for AAA   Resume Metoprolol 100 mg twice a day   Follow up with Dr.Smith as planned

## 2016-09-14 ENCOUNTER — Ambulatory Visit (HOSPITAL_COMMUNITY)
Admission: RE | Admit: 2016-09-14 | Discharge: 2016-09-14 | Disposition: A | Discharge status: Home / Self Care | Payer: No Typology Code available for payment source | Source: Ambulatory Visit | Attending: Cardiology | Admitting: Cardiology

## 2016-09-14 DIAGNOSIS — I739 Peripheral vascular disease, unspecified: Secondary | ICD-10-CM | POA: Diagnosis not present

## 2016-09-14 DIAGNOSIS — I708 Atherosclerosis of other arteries: Secondary | ICD-10-CM | POA: Diagnosis not present

## 2016-09-14 DIAGNOSIS — M545 Low back pain, unspecified: Secondary | ICD-10-CM

## 2016-09-14 DIAGNOSIS — R0989 Other specified symptoms and signs involving the circulatory and respiratory systems: Secondary | ICD-10-CM | POA: Diagnosis not present

## 2016-09-14 DIAGNOSIS — Z951 Presence of aortocoronary bypass graft: Secondary | ICD-10-CM | POA: Insufficient documentation

## 2016-09-14 DIAGNOSIS — Z87891 Personal history of nicotine dependence: Secondary | ICD-10-CM | POA: Insufficient documentation

## 2016-09-14 DIAGNOSIS — I10 Essential (primary) hypertension: Secondary | ICD-10-CM | POA: Diagnosis not present

## 2016-09-14 DIAGNOSIS — I2510 Atherosclerotic heart disease of native coronary artery without angina pectoris: Secondary | ICD-10-CM | POA: Insufficient documentation

## 2016-09-14 DIAGNOSIS — I700 Atherosclerosis of aorta: Secondary | ICD-10-CM | POA: Insufficient documentation

## 2016-09-14 DIAGNOSIS — E119 Type 2 diabetes mellitus without complications: Secondary | ICD-10-CM | POA: Diagnosis not present

## 2016-09-17 ENCOUNTER — Telehealth: Payer: Self-pay | Admitting: Interventional Cardiology

## 2016-09-17 NOTE — Telephone Encounter (Signed)
Returned pts call and discussed the VAS [place] AAA results with him, to help him understand, and he is aware someone from Dr. Renelda Mom office will call him with an appt.  Pt verbalized understanding.

## 2016-09-17 NOTE — Telephone Encounter (Signed)
Follow Up:; ° ° °Returning your call. °

## 2016-09-22 ENCOUNTER — Encounter: Payer: Self-pay | Admitting: Cardiovascular Disease

## 2016-09-22 ENCOUNTER — Other Ambulatory Visit: Payer: Self-pay | Admitting: Cardiovascular Disease

## 2016-09-22 ENCOUNTER — Ambulatory Visit (INDEPENDENT_AMBULATORY_CARE_PROVIDER_SITE_OTHER): Payer: No Typology Code available for payment source | Admitting: Cardiovascular Disease

## 2016-09-22 VITALS — BP 198/82 | HR 50 | Ht 68.0 in | Wt 177.0 lb

## 2016-09-22 DIAGNOSIS — I25810 Atherosclerosis of coronary artery bypass graft(s) without angina pectoris: Secondary | ICD-10-CM | POA: Diagnosis not present

## 2016-09-22 DIAGNOSIS — N183 Chronic kidney disease, stage 3 unspecified: Secondary | ICD-10-CM

## 2016-09-22 DIAGNOSIS — I739 Peripheral vascular disease, unspecified: Secondary | ICD-10-CM | POA: Diagnosis not present

## 2016-09-22 DIAGNOSIS — I10 Essential (primary) hypertension: Secondary | ICD-10-CM | POA: Diagnosis not present

## 2016-09-22 LAB — BASIC METABOLIC PANEL WITH GFR
BUN: 22 mg/dL (ref 7–25)
CHLORIDE: 102 mmol/L (ref 98–110)
CO2: 22 mmol/L (ref 20–31)
Calcium: 8 mg/dL — ABNORMAL LOW (ref 8.6–10.3)
Creat: 2.48 mg/dL — ABNORMAL HIGH (ref 0.70–1.18)
GFR, EST AFRICAN AMERICAN: 28 mL/min — AB (ref >=60)
GFR, EST NON AFRICAN AMERICAN: 24 mL/min — AB (ref >=60)
Glucose, Bld: 76 mg/dL (ref 65–99)
POTASSIUM: 4.6 mmol/L (ref 3.5–5.3)
SODIUM: 134 mmol/L — AB (ref 135–146)

## 2016-09-22 LAB — CBC WITH DIFFERENTIAL/PLATELET
BASOS PCT: 0 %
Basophils Absolute: 0 cells/uL (ref 0–200)
EOS PCT: 3 %
Eosinophils Absolute: 270 cells/uL (ref 15–500)
HCT: 29.7 % — ABNORMAL LOW (ref 38.5–50.0)
HEMOGLOBIN: 9.7 g/dL — AB (ref 13.2–17.1)
LYMPHS ABS: 1440 {cells}/uL (ref 850–3900)
Lymphocytes Relative: 16 %
MCH: 31 pg (ref 27.0–33.0)
MCHC: 32.7 g/dL (ref 32.0–36.0)
MCV: 94.9 fL (ref 80.0–100.0)
MPV: 9 fL (ref 7.5–12.5)
Monocytes Absolute: 990 cells/uL — ABNORMAL HIGH (ref 200–950)
Monocytes Relative: 11 %
NEUTROS ABS: 6300 {cells}/uL (ref 1500–7800)
NEUTROS PCT: 70 %
Platelets: 371 10*3/uL (ref 140–400)
RBC: 3.13 MIL/uL — AB (ref 4.20–5.80)
RDW: 13.7 % (ref 11.0–15.0)
WBC: 9 10*3/uL (ref 3.8–10.8)

## 2016-09-22 NOTE — Assessment & Plan Note (Signed)
[name] was referred by Dr. Katrinka Blazing for evaluation of claudication. He does have a history of CAD status post bypass grafting in the past. Her extremity arterial Doppler studies performed 09/14/16 revealing high-frequency signals in both the right and left common iliac arteries. 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 He has moderate renal insufficiency followed by Dr. Hyman Hopes. I'm going to arrange to perform abdominal aortography and potential endovascular therapy for lifestyle limiting claudication.

## 2016-09-22 NOTE — Patient Instructions (Signed)
   [facility] CARDIOVASCULAR DIVISION [facility] [address] [place] [address] Dept: [phone] Loc: [phone]  [name]  09/22/2016  You are scheduled for a Peripheral Angiogram on Monday, April 30 with Dr. Nanetta Batty.  1. Please arrive at the [facility] (Main Entrance A) at [facility]: [address] [place], [place] [address] at 7:30 AM (two hours before your procedure to ensure your preparation). Free valet parking service is available.   Special note: Every effort is made to have your procedure done on time. Please understand that emergencies sometimes delay scheduled procedures.  2. Diet: Do not eat or drink anything after midnight prior to your procedure except sips of water to take medications.  3. Labs: You will need to have blood drawn on Thursday, April 26 at [facility] [address], [place]  Open: 8am - 5pm (Lunch 12:30 - 1:30)   Phone: [phone]. You do not need to be fasting.  4. Medication instructions in preparation for your procedure:  On the morning of your procedure, take your Aspirin and any morning medicines NOT listed above.  You may use sips of water.  5. Plan for one night stay--bring personal belongings. 6. Bring a current list of your medications and current insurance cards. 7. You MUST have a responsible person to drive you home. 8. Someone MUST be with you the first 24 hours after you arrive home or your discharge will be delayed. 9. Please wear clothes that are easy to get on and off and wear slip-on shoes.  Thank you for allowing [place] to care for you!   --  Invasive Cardiovascular services

## 2016-09-22 NOTE — Progress Notes (Signed)
09/22/2016 [name]   03/28/1941  [id number]  Primary Physician Edward Raider, MD Primary Cardiologist: Edward Gess MD [name]  HPI:  [name] is a delightful 76 year old mildly overweight married Caucasian male father of 3, grandfather of her grandchildren is accompanied by his [place] today. He was referred by Dr. Katrinka Stein for peripheral vascular evaluation because of lifestyle limiting claudication. He does have a history of treated hypertension and hyperlipidemia. 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 He is on Zetia. He has discomfort in both legs when he walks. Recent Doppler suggested high-frequency signals in both iliac arteries. In addition, he has moderate renal insufficiency.   Current Outpatient Prescriptions  Medication Sig Dispense Refill  . aspirin EC 81 MG tablet Take 81 mg by mouth at bedtime.     Marland Kitchen ezetimibe (ZETIA) 10 MG tablet Take 5 mg by mouth daily.    . ferrous sulfate 325 (65 FE) MG tablet Take 325 mg by mouth at bedtime.     . metoprolol (LOPRESSOR) 100 MG tablet Take 100 mg by mouth 2 (two) times daily.    . nitroGLYCERIN (NITROSTAT) 0.4 MG SL tablet Place 0.4 mg under the tongue every 5 (five) minutes as needed for chest pain.     No current facility-administered medications for this visit.     Allergies  Allergen Reactions  . 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   . Other Swelling, Rash and Other (See Comments)    Pt states that he is allergic to all -mycins.   Reaction:  Leg swelling   . Statins Other (See Comments)    Reaction:  Muscle pain   . Hydralazine Dermatitis  . Fish Oil Hives  . Norvasc [Amlodipine] Swelling and Rash    Reaction:  Leg swelling     Social History   Social History  . Marital status: Married    Spouse name: N/A  . Number of  children: N/A  . Years of education: N/A   Occupational History  . Not on file.   Social History Main Topics  . Smoking status: Former Smoker    Packs/day: 1.50    Years: 35.00    Types: Cigarettes    Quit date: 01/08/1994  . Smokeless tobacco: Never Used  . Alcohol use 16.8 oz/week    28 Cans of beer per week     Comment: 3-5 beers everyday.  . Drug use: No  . Sexual activity: Not Currently   Other Topics Concern  . Not on file   Social History Narrative  . No narrative on file     Review of Systems: General: negative for chills, fever, night sweats or weight changes.  Cardiovascular: negative for chest pain, dyspnea on exertion, edema, orthopnea, palpitations, paroxysmal nocturnal dyspnea or shortness of breath Dermatological: negative for rash Respiratory: negative for cough or wheezing Urologic: negative for hematuria Abdominal: negative for nausea, vomiting, diarrhea, bright red blood per rectum, melena, or hematemesis Neurologic: negative for visual changes, syncope, or dizziness All other systems reviewed and are otherwise negative except as noted above.    Blood pressure (!) 198/82, pulse (!) 50, height 5\' 8"  (1.727 m), weight 177 lb (80.3 kg).  General appearance: alert and no distress Neck: no adenopathy, no carotid bruit, no JVD, supple, symmetrical, trachea midline and thyroid  not enlarged, symmetric, no tenderness/mass/nodules Lungs: clear to auscultation bilaterally Heart: regular rate and rhythm, S1, S2 normal, no murmur, click, rub or gallop Extremities: extremities normal, atraumatic, no cyanosis or edema  EKG sinus bradycardia at 50 PACs. I personally reviewed this EKG.  ASSESSMENT AND PLAN:   Bilateral claudication of lower limb [facility]) Mr. [name] was referred by Dr. Katrinka Stein for evaluation of claudication. He does have a history of CAD status post bypass grafting in the past. Her extremity arterial Doppler studies performed 09/14/16 revealing  high-frequency signals in both the right and left common iliac arteries. 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 He has moderate renal insufficiency followed by Dr. Hyman Stein. I'm going to arrange to perform abdominal aortography and potential endovascular therapy for lifestyle limiting claudication.      Edward Gess MD FACP,FACC,FAHA, [facility] 09/22/2016 2:39 PM

## 2016-09-23 ENCOUNTER — Ambulatory Visit
Admission: RE | Admit: 2016-09-23 | Discharge: 2016-09-23 | Disposition: A | Discharge status: Home / Self Care | Payer: No Typology Code available for payment source | Source: Ambulatory Visit | Attending: Cardiovascular Disease | Admitting: Cardiovascular Disease

## 2016-09-23 DIAGNOSIS — I10 Essential (primary) hypertension: Secondary | ICD-10-CM

## 2016-09-23 DIAGNOSIS — I739 Peripheral vascular disease, unspecified: Secondary | ICD-10-CM

## 2016-09-23 DIAGNOSIS — I25810 Atherosclerosis of coronary artery bypass graft(s) without angina pectoris: Secondary | ICD-10-CM

## 2016-09-23 LAB — APTT: APTT: 26 s (ref 22–34)

## 2016-09-23 LAB — PROTIME-INR
INR: 1
Prothrombin Time: 10.7 s (ref 9.0–11.5)

## 2016-09-26 ENCOUNTER — Inpatient Hospital Stay (HOSPITAL_COMMUNITY)
Admission: RE | Admit: 2016-09-26 | Discharge: 2016-09-28 | DRG: 301 | Disposition: A | Discharge status: Home / Self Care | Payer: Medicare Other | Source: Ambulatory Visit | Attending: Cardiovascular Disease | Admitting: Cardiovascular Disease

## 2016-09-26 ENCOUNTER — Encounter (HOSPITAL_COMMUNITY): Payer: Self-pay

## 2016-09-26 DIAGNOSIS — I739 Peripheral vascular disease, unspecified: Secondary | ICD-10-CM | POA: Diagnosis not present

## 2016-09-26 DIAGNOSIS — I2510 Atherosclerotic heart disease of native coronary artery without angina pectoris: Secondary | ICD-10-CM | POA: Diagnosis present

## 2016-09-26 DIAGNOSIS — Z951 Presence of aortocoronary bypass graft: Secondary | ICD-10-CM

## 2016-09-26 DIAGNOSIS — E1151 Type 2 diabetes mellitus with diabetic peripheral angiopathy without gangrene: Principal | ICD-10-CM | POA: Diagnosis present

## 2016-09-26 DIAGNOSIS — Z87891 Personal history of nicotine dependence: Secondary | ICD-10-CM | POA: Diagnosis not present

## 2016-09-26 DIAGNOSIS — E1159 Type 2 diabetes mellitus with other circulatory complications: Secondary | ICD-10-CM | POA: Diagnosis present

## 2016-09-26 DIAGNOSIS — E1122 Type 2 diabetes mellitus with diabetic chronic kidney disease: Secondary | ICD-10-CM | POA: Diagnosis present

## 2016-09-26 DIAGNOSIS — I70213 Atherosclerosis of native arteries of extremities with intermittent claudication, bilateral legs: Secondary | ICD-10-CM | POA: Diagnosis not present

## 2016-09-26 DIAGNOSIS — I252 Old myocardial infarction: Secondary | ICD-10-CM

## 2016-09-26 DIAGNOSIS — N183 Chronic kidney disease, stage 3 (moderate): Secondary | ICD-10-CM | POA: Diagnosis present

## 2016-09-26 DIAGNOSIS — Z881 Allergy status to other antibiotic agents status: Secondary | ICD-10-CM

## 2016-09-26 DIAGNOSIS — I129 Hypertensive chronic kidney disease with stage 1 through stage 4 chronic kidney disease, or unspecified chronic kidney disease: Secondary | ICD-10-CM | POA: Diagnosis present

## 2016-09-26 DIAGNOSIS — Z6827 Body mass index (BMI) 27.0-27.9, adult: Secondary | ICD-10-CM | POA: Diagnosis not present

## 2016-09-26 DIAGNOSIS — Z79899 Other long term (current) drug therapy: Secondary | ICD-10-CM

## 2016-09-26 DIAGNOSIS — Z7982 Long term (current) use of aspirin: Secondary | ICD-10-CM

## 2016-09-26 DIAGNOSIS — Z91018 Allergy to other foods: Secondary | ICD-10-CM

## 2016-09-26 DIAGNOSIS — E663 Overweight: Secondary | ICD-10-CM | POA: Diagnosis present

## 2016-09-26 DIAGNOSIS — E785 Hyperlipidemia, unspecified: Secondary | ICD-10-CM | POA: Diagnosis present

## 2016-09-26 DIAGNOSIS — I10 Essential (primary) hypertension: Secondary | ICD-10-CM | POA: Diagnosis not present

## 2016-09-26 DIAGNOSIS — Z888 Allergy status to other drugs, medicaments and biological substances status: Secondary | ICD-10-CM

## 2016-09-26 DIAGNOSIS — N184 Chronic kidney disease, stage 4 (severe): Secondary | ICD-10-CM | POA: Diagnosis present

## 2016-09-26 LAB — CBC
HEMATOCRIT: 28.7 % — AB (ref 39.0–52.0)
HEMOGLOBIN: 9.5 g/dL — AB (ref 13.0–17.0)
MCH: 30.6 pg (ref 26.0–34.0)
MCHC: 33.1 g/dL (ref 30.0–36.0)
MCV: 92.6 fL (ref 78.0–100.0)
Platelets: 297 10*3/uL (ref 150–400)
RBC: 3.1 MIL/uL — ABNORMAL LOW (ref 4.22–5.81)
RDW: 12.7 % (ref 11.5–15.5)
WBC: 8.6 10*3/uL (ref 4.0–10.5)

## 2016-09-26 LAB — CREATININE, SERUM
Creatinine, Ser: 2.37 mg/dL — ABNORMAL HIGH (ref 0.61–1.24)
GFR calc Af Amer: 29 mL/min — ABNORMAL LOW (ref >60)
GFR calc non Af Amer: 25 mL/min — ABNORMAL LOW (ref >60)

## 2016-09-26 MED ORDER — FERROUS SULFATE 325 (65 FE) MG PO TABS
325.0000 mg | ORAL_TABLET | Freq: Every day | ORAL | Status: DC
  Administered 2016-09-26 – 2016-09-27 (×2): 325 mg via ORAL
  Filled 2016-09-26 (×2): qty 1

## 2016-09-26 MED ORDER — CO Q-10 100 MG PO CAPS: 100.0000 mg | ORAL_CAPSULE | Freq: Every day | ORAL | Status: DC

## 2016-09-26 MED ORDER — ASPIRIN 81 MG PO CHEW
81.0000 mg | CHEWABLE_TABLET | ORAL | Status: AC
  Administered 2016-09-27: 81 mg via ORAL
  Filled 2016-09-26: qty 1

## 2016-09-26 MED ORDER — METOPROLOL TARTRATE 100 MG PO TABS
100.0000 mg | ORAL_TABLET | Freq: Two times a day (BID) | ORAL | Status: DC
  Filled 2016-09-26: qty 1

## 2016-09-26 MED ORDER — HEPARIN SODIUM (PORCINE) 5000 UNIT/ML IJ SOLN
5000.0000 [IU] | Freq: Three times a day (TID) | INTRAMUSCULAR | Status: DC
  Administered 2016-09-27 – 2016-09-28 (×2): 5000 [IU] via SUBCUTANEOUS
  Filled 2016-09-26 (×2): qty 1

## 2016-09-26 MED ORDER — NITROGLYCERIN 0.4 MG SL SUBL: 0.4000 mg | SUBLINGUAL_TABLET | SUBLINGUAL | Status: DC | PRN

## 2016-09-26 MED ORDER — EZETIMIBE 10 MG PO TABS
5.0000 mg | ORAL_TABLET | Freq: Every day | ORAL | Status: DC
  Administered 2016-09-28: 5 mg via ORAL
  Filled 2016-09-26: qty 1

## 2016-09-26 MED ORDER — SODIUM CHLORIDE 0.9 % IV SOLN
INTRAVENOUS | Status: DC
  Administered 2016-09-27: 03:00:00 via INTRAVENOUS

## 2016-09-26 MED ORDER — ACETAMINOPHEN 325 MG PO TABS: 650.0000 mg | ORAL_TABLET | ORAL | Status: DC | PRN

## 2016-09-26 MED ORDER — SODIUM CHLORIDE 0.9 % WEIGHT BASED INFUSION: 3.0000 mL/kg/h | INTRAVENOUS | Status: DC

## 2016-09-26 MED ORDER — CLONIDINE HCL 0.1 MG PO TABS
0.1000 mg | ORAL_TABLET | Freq: Once | ORAL | Status: AC
  Administered 2016-09-26: 0.1 mg via ORAL
  Filled 2016-09-26: qty 1

## 2016-09-26 MED ORDER — SODIUM CHLORIDE 0.9 % WEIGHT BASED INFUSION: 1.0000 mL/kg/h | INTRAVENOUS | Status: DC

## 2016-09-26 MED ORDER — ASPIRIN EC 81 MG PO TBEC
81.0000 mg | DELAYED_RELEASE_TABLET | Freq: Every day | ORAL | Status: DC
  Administered 2016-09-26 – 2016-09-27 (×2): 81 mg via ORAL
  Filled 2016-09-26 (×2): qty 1

## 2016-09-26 MED ORDER — ONDANSETRON HCL 4 MG/2ML IJ SOLN: 4.0000 mg | Freq: Four times a day (QID) | INTRAMUSCULAR | Status: DC | PRN

## 2016-09-26 MED ORDER — SODIUM CHLORIDE 0.9% FLUSH: 3.0000 mL | INTRAVENOUS | Status: DC | PRN

## 2016-09-26 MED ORDER — HEPARIN SODIUM (PORCINE) 5000 UNIT/ML IJ SOLN
5000.0000 [IU] | Freq: Once | INTRAMUSCULAR | Status: AC
  Administered 2016-09-26: 5000 [IU] via SUBCUTANEOUS
  Filled 2016-09-26: qty 1

## 2016-09-26 NOTE — Progress Notes (Signed)
Called MD on call for a BP of 218/73. Order for 0.1 mg of Clonidine ordered. Educated pt, pt verbalized understanding. Will administer and continue to monitor.   [name] M

## 2016-09-26 NOTE — H&P (Signed)
09/22/2016 [name]   [birth year]  [id number]  Primary Physician Lupita Raider, MD Primary Cardiologist: Runell Gess MD Roseanne Reno  HPI:  [name] is a delightful 76 year old mildly overweight married Caucasian male father of 3, grandfather of her grandchildren is accompanied by his [place] today. He was referred by Dr. Katrinka Blazing for peripheral vascular evaluation because of lifestyle limiting claudication. He does have a history of treated hypertension and hyperlipidemia. 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 He is on Zetia. He has discomfort in both legs when he walks. Recent Doppler suggested high-frequency signals in both iliac arteries. In addition, he has moderate renal insufficiency.  He denies any changes in the last few. No chest pain or SOB.         Current Outpatient Prescriptions  Medication Sig Dispense Refill  . aspirin EC 81 MG tablet Take 81 mg by mouth at bedtime.     Marland Kitchen ezetimibe (ZETIA) 10 MG tablet Take 5 mg by mouth daily.    . ferrous sulfate 325 (65 FE) MG tablet Take 325 mg by mouth at bedtime.     . metoprolol (LOPRESSOR) 100 MG tablet Take 100 mg by mouth 2 (two) times daily.    . nitroGLYCERIN (NITROSTAT) 0.4 MG SL tablet Place 0.4 mg under the tongue every 5 (five) minutes as needed for chest pain.     No current facility-administered medications for this visit.          Allergies  Allergen Reactions  . 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   . Other Swelling, Rash and Other (See Comments)    Pt states that he is allergic to all -mycins.   Reaction:  Leg swelling   . Statins Other (See Comments)    Reaction:  Muscle pain   . Hydralazine Dermatitis  . Fish Oil Hives  . Norvasc [Amlodipine] Swelling and Rash    Reaction:  Leg swelling      Social History        Social History  . Marital status: Married    Spouse name: N/A  . Number of children: N/A  . Years of education: N/A      Occupational History  . Not on file.         Social History Main Topics  . Smoking status: Former Smoker    Packs/day: 1.50    Years: 35.00    Types: Cigarettes    Quit date: 01/08/1994  . Smokeless tobacco: Never Used  . Alcohol use 16.8 oz/week    28 Cans of beer per week     Comment: 3-5 beers everyday.  . Drug use: No  . Sexual activity: Not Currently       Other Topics Concern  . Not on file      Social History Narrative  . No narrative on file     Review of Systems: General: negative for chills, fever, night sweats or weight changes.  Cardiovascular: negative for chest pain, dyspnea on exertion, edema, orthopnea, palpitations, paroxysmal nocturnal dyspnea or shortness of breath Dermatological: negative for rash Respiratory: negative for cough or wheezing Urologic: negative for hematuria Abdominal: negative for nausea, vomiting, diarrhea, bright red blood per rectum, melena, or hematemesis Neurologic: negative for visual changes, syncope, or dizziness All other systems reviewed and are otherwise  negative except as noted above.    Blood pressure (!) 198/82, pulse (!) 50, height 5\' 8"  (1.727 m), weight 177 lb (80.3 kg).  General appearance: alert and no distress Neck: no adenopathy, no carotid bruit, no JVD, supple, symmetrical, trachea midline and thyroid not enlarged, symmetric, no tenderness/mass/nodules Lungs: clear to auscultation bilaterally Heart: regular rate and rhythm, S1, S2 normal, no murmur, click, rub or gallop Extremities: extremities normal, atraumatic, no cyanosis. 1-2+ bilaterally pitting edema  EKG sinus bradycardia at 50 PACs. I personally reviewed this EKG.  ASSESSMENT AND PLAN:   Bilateral claudication of lower limb [facility]) [name] was referred by Dr. Katrinka Blazing  for evaluation of claudication. He does have a history of CAD status post bypass grafting in the past. Her extremity arterial Doppler studies performed 09/14/16 revealing high-frequency signals in both the right and left common iliac arteries. 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 He has moderate renal insufficiency followed by Dr. Hyman Hopes. I'm going to arrange to perform abdominal aortography and potential endovascular therapy for lifestyle limiting claudication.   Patient is a direct admit from home today by Dr. Allyson Sabal for IV hydration prior to LE angiography on 09/27/2016. He does have 1-2+ pitting edema bilaterally, patient states this is usual for him and tend to get worse throughout the day. Will start on IV/hr for now unless he starts having any SOB.   Ramond Dial PA Pager: [phone]  Patient examined chart reviewed. Exam remarkable for bilateral femoral bruits decreased peripheral pulses post sternotomy with clear lungs. Last Cr elevated 2.48 4 days ago Classic claudication Admitted for hydration before Peripheral angiography LE duplex with bilateral iliac artery velocities over 78m/sec. Suspect JB will also assess the renal arteries. ? How much of procedure can be done with Co2 if complex intervention needed may need to be staged History of muscle pain with statin on zetia no other renal toxic drugs  Charlton Haws

## 2016-09-27 ENCOUNTER — Encounter (HOSPITAL_COMMUNITY)
Admission: RE | Disposition: A | Discharge status: Home / Self Care | Payer: Self-pay | Source: Ambulatory Visit | Attending: Cardiovascular Disease

## 2016-09-27 ENCOUNTER — Encounter (HOSPITAL_COMMUNITY): Payer: Self-pay | Admitting: Cardiovascular Disease

## 2016-09-27 DIAGNOSIS — I70213 Atherosclerosis of native arteries of extremities with intermittent claudication, bilateral legs: Secondary | ICD-10-CM

## 2016-09-27 HISTORY — PX: LOWER EXTREMITY ANGIOGRAPHY: CATH118251

## 2016-09-27 LAB — BASIC METABOLIC PANEL
ANION GAP: 5 (ref 5–15)
BUN: 21 mg/dL — ABNORMAL HIGH (ref 6–20)
CALCIUM: 7.7 mg/dL — AB (ref 8.9–10.3)
CO2: 22 mmol/L (ref 22–32)
CREATININE: 2.46 mg/dL — AB (ref 0.61–1.24)
Chloride: 108 mmol/L (ref 101–111)
GFR calc Af Amer: 28 mL/min — ABNORMAL LOW (ref >60)
GFR, EST NON AFRICAN AMERICAN: 24 mL/min — AB (ref >60)
Glucose, Bld: 87 mg/dL (ref 65–99)
Potassium: 4.1 mmol/L (ref 3.5–5.1)
Sodium: 135 mmol/L (ref 135–145)

## 2016-09-27 LAB — SURGICAL PCR SCREEN
MRSA, PCR: NEGATIVE
Staphylococcus aureus: POSITIVE — AB

## 2016-09-27 SURGERY — LOWER EXTREMITY ANGIOGRAPHY
Anesthesia: LOCAL

## 2016-09-27 MED ORDER — LIDOCAINE HCL 1 % IJ SOLN
INTRAMUSCULAR | Status: AC
  Filled 2016-09-27: qty 20

## 2016-09-27 MED ORDER — FENTANYL CITRATE (PF) 100 MCG/2ML IJ SOLN
INTRAMUSCULAR | Status: DC | PRN
  Administered 2016-09-27: 25 ug via INTRAVENOUS

## 2016-09-27 MED ORDER — MUPIROCIN 2 % EX OINT
TOPICAL_OINTMENT | Freq: Two times a day (BID) | CUTANEOUS | Status: DC
  Filled 2016-09-27: qty 22

## 2016-09-27 MED ORDER — NITROGLYCERIN 1 MG/10 ML FOR IR/CATH LAB
INTRA_ARTERIAL | Status: AC
  Filled 2016-09-27: qty 10

## 2016-09-27 MED ORDER — ONDANSETRON HCL 4 MG/2ML IJ SOLN: 4.0000 mg | Freq: Four times a day (QID) | INTRAMUSCULAR | Status: DC | PRN

## 2016-09-27 MED ORDER — HEPARIN (PORCINE) IN NACL 2-0.9 UNIT/ML-% IJ SOLN
INTRAMUSCULAR | Status: AC
  Filled 2016-09-27: qty 1000

## 2016-09-27 MED ORDER — METOPROLOL TARTRATE 50 MG PO TABS
50.0000 mg | ORAL_TABLET | Freq: Two times a day (BID) | ORAL | Status: DC
  Administered 2016-09-27 – 2016-09-28 (×2): 50 mg via ORAL
  Filled 2016-09-27 (×2): qty 1

## 2016-09-27 MED ORDER — MUPIROCIN 2 % EX OINT
1.0000 "application " | TOPICAL_OINTMENT | Freq: Two times a day (BID) | CUTANEOUS | Status: DC
  Administered 2016-09-27 (×2): 1 via NASAL
  Filled 2016-09-27 (×2): qty 22

## 2016-09-27 MED ORDER — SODIUM CHLORIDE 0.9 % IV SOLN
INTRAVENOUS | Status: AC
  Administered 2016-09-27: 15:00:00 via INTRAVENOUS

## 2016-09-27 MED ORDER — IODIXANOL 320 MG/ML IV SOLN
INTRAVENOUS | Status: DC | PRN
  Administered 2016-09-27: 20 mL via INTRA_ARTERIAL

## 2016-09-27 MED ORDER — CLONIDINE HCL 0.1 MG PO TABS
0.1000 mg | ORAL_TABLET | Freq: Three times a day (TID) | ORAL | Status: DC
  Administered 2016-09-27 – 2016-09-28 (×2): 0.1 mg via ORAL
  Filled 2016-09-27 (×2): qty 1

## 2016-09-27 MED ORDER — MIDAZOLAM HCL 2 MG/2ML IJ SOLN
INTRAMUSCULAR | Status: AC
  Filled 2016-09-27: qty 2

## 2016-09-27 MED ORDER — CHLORHEXIDINE GLUCONATE CLOTH 2 % EX PADS: 6.0000 | MEDICATED_PAD | Freq: Every day | CUTANEOUS | Status: DC

## 2016-09-27 MED ORDER — LIDOCAINE HCL (PF) 1 % IJ SOLN
INTRAMUSCULAR | Status: DC | PRN
  Administered 2016-09-27: 20 mL

## 2016-09-27 MED ORDER — FENTANYL CITRATE (PF) 100 MCG/2ML IJ SOLN
INTRAMUSCULAR | Status: AC
  Filled 2016-09-27: qty 2

## 2016-09-27 MED ORDER — NITROGLYCERIN 1 MG/10 ML FOR IR/CATH LAB
INTRA_ARTERIAL | Status: DC | PRN
  Administered 2016-09-27: 200 ug via INTRA_ARTERIAL

## 2016-09-27 MED ORDER — ACETAMINOPHEN 325 MG PO TABS: 650.0000 mg | ORAL_TABLET | ORAL | Status: DC | PRN

## 2016-09-27 MED ORDER — HYDRALAZINE HCL 20 MG/ML IJ SOLN
INTRAMUSCULAR | Status: AC
  Filled 2016-09-27: qty 1

## 2016-09-27 MED ORDER — MORPHINE SULFATE (PF) 4 MG/ML IV SOLN: 2.0000 mg | INTRAVENOUS | Status: DC | PRN

## 2016-09-27 MED ORDER — ASPIRIN EC 81 MG PO TBEC: 81.0000 mg | DELAYED_RELEASE_TABLET | Freq: Every day | ORAL | Status: DC

## 2016-09-27 MED ORDER — NITROGLYCERIN IN D5W 200-5 MCG/ML-% IV SOLN
INTRAVENOUS | Status: DC | PRN
  Administered 2016-09-27: 10 ug/min via INTRAVENOUS

## 2016-09-27 MED ORDER — HEPARIN (PORCINE) IN NACL 2-0.9 UNIT/ML-% IJ SOLN
INTRAMUSCULAR | Status: DC | PRN
  Administered 2016-09-27: 1000 mL

## 2016-09-27 MED ORDER — MIDAZOLAM HCL 2 MG/2ML IJ SOLN
INTRAMUSCULAR | Status: DC | PRN
  Administered 2016-09-27: 1 mg via INTRAVENOUS

## 2016-09-27 MED ORDER — FUROSEMIDE 10 MG/ML IJ SOLN
40.0000 mg | Freq: Once | INTRAMUSCULAR | Status: AC
  Administered 2016-09-27: 40 mg via INTRAVENOUS

## 2016-09-27 MED ORDER — HYDRALAZINE HCL 20 MG/ML IJ SOLN
10.0000 mg | Freq: Once | INTRAMUSCULAR | Status: AC
  Administered 2016-09-27: 13:00:00 via INTRAVENOUS

## 2016-09-27 MED ORDER — NITROGLYCERIN IN D5W 200-5 MCG/ML-% IV SOLN
INTRAVENOUS | Status: AC
  Filled 2016-09-27: qty 250

## 2016-09-27 SURGICAL SUPPLY — 10 items
CATH ANGIO 5F PIGTAIL 65CM (CATHETERS) ×2 IMPLANT
CATH STRAIGHT 5FR 65CM (CATHETERS) ×2 IMPLANT
KIT PV (KITS) ×2 IMPLANT
SHEATH PINNACLE 5F 10CM (SHEATH) ×2 IMPLANT
STOPCOCK MORSE 400PSI 3WAY (MISCELLANEOUS) ×2 IMPLANT
SYRINGE MEDRAD AVANTA MACH 7 (SYRINGE) ×2 IMPLANT
TRANSDUCER W/STOPCOCK (MISCELLANEOUS) ×2 IMPLANT
TRAY PV CATH (CUSTOM PROCEDURE TRAY) ×2 IMPLANT
TUBING CIL FLEX 10 FLL-RA (TUBING) ×2 IMPLANT
WIRE HITORQ VERSACORE ST 145CM (WIRE) ×2 IMPLANT

## 2016-09-27 NOTE — Progress Notes (Signed)
Order for sheath removal verified per post procedural orders. Procedure explained to patient and Rt femoral artery access site assessed: level 0, palpable dorsalis pedis and posterior tibial pulses. 5 [place] Sheath removed and manual pressure applied for 20 minutes. Pre, peri, & post procedural vitals: HR 55-65, RR 15-17, O2 Sat upper 90's, BP 165-180/45-69, Pain 0. Distal pulses remained intact after sheath removal. Access site level 0 and dressed with 4X4 gauze and tegaderm.  Lorin Glass, RN confirmed condition of site. Post procedural instructions discussed and return demonstration from patient.

## 2016-09-27 NOTE — Progress Notes (Signed)
Patient noted to have 6 beats of SVT at 0851. Patient was asymptomatic at the time, and was resting in the bed at time of event. Paged Dr. Nanetta Batty at this time. Patient has gone to cath lab.

## 2016-09-27 NOTE — Discharge Summary (Signed)
Discharge Summary    Patient ID: [name],  MRN: [id number], DOB/AGE: 02/02/1941 76 y.o.  Admit date: 09/26/2016 Discharge date: 09/28/2016  Primary Care Provider: Lupita Stein Primary Cardiologist: [name]  Discharge Diagnoses    Active Problems:   Hyperlipidemia   Hx of CABG   CKD (chronic kidney disease), stage III   Type 2 diabetes mellitus with vascular disease (HCC)   Bilateral claudication of lower limb (HCC)   Claudication (HCC)   PAD (peripheral artery disease) (HCC)  Allergies Allergies  Allergen Reactions  . 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   . Other Swelling, Rash and Other (See Comments)    Pt states that he is allergic to all -mycins.   Reaction:  Leg swelling   . Statins Other (See Comments)    Reaction:  Muscle pain   . Hydralazine Dermatitis  . Fish Oil Hives    Pt reports no reaction to fish (only fish oil capsules)  . Norvasc [Amlodipine] Swelling and Rash    Reaction:  Leg swelling     Diagnostic Studies/Procedures    Angiographic Data:   1: Distal abdominal aorta-fluoroscopically calcified 2: Left lower extremity-long segmental 50-60% calcified ostial/proximal left common iliac artery stenosis 3: Right lower extremity-50-60% calcified ostial right common iliac artery stenosis with a 30 mm pullback gradient after administration of 200 g of intra-arterial nitroglycerin  IMPRESSION: Hemodynamically significant calcified right and left proximal common iliac artery stenosis. Because of his serum creatinine of 2.4 with a clearance of 29 mL/m I have elected to stage his intervention which will require diamondback orbital rotation atherectomy uncovered stenting of both iliac arteries. His blood pressure is elevated. He cannot take hydralazine. I put him on a low dose IV nitroglycerin drip to manage his hypertension order to safely remove his sheaths. He can be discharged home later today  and I'll follow him up in the office in 2-3 weeks.   [name]. MD, [facility] _____________   History of Present Illness    [name] is a delightful 76 year old mildly overweight married Caucasian male father of 3, grandfather of her grandchildren. He was referred by Dr. Katrinka Stein for peripheral vascular evaluation because of lifestyle limiting claudication. He does have a history of treated hypertension and hyperlipidemia. 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 He is on Zetia. He reported discomfort in both legs when he walks. Recent Doppler suggested high-frequency signals in both iliac arteries. In addition, he has moderate renal insufficiency. It was arranged for him to undergo abdominal aortography and potential endovascular therapy.   Hospital Course      He was direct admitted for IV hydration given his last Cr prior to admission was 2.48. His Cr improved mildly at 2.37, but elevated back up to 2.46 the day of procedure. He underwent PV angiogram with Dr. Allyson Stein noted above without intervention. His blood pressure was elevated post procedure and placed on IV nitro. He was a difficult sheath pull, and was held overnight for further observation. Planned for staged intervention per Dr. Allyson Stein. Follow up arranged to bring him back in 2 weeks to the office to arrange plan for intervention.   He was seen by EdwardMcAlhaney and determined stable for discharge home. Follow up was arranged in the office. Medications are listed below.  _____________  Discharge Vitals Blood pressure Marland Kitchen)  177/57, pulse (!) 56, temperature 98.2 F (36.8 C), temperature source Oral, resp. rate 18, height 5\' 8"  (1.727 m), weight 179 lb 0.2 oz (81.2 kg), SpO2 92 %.  Filed Weights   09/26/16 1647 09/27/16 0309  Weight: 176 lb 5.9 oz (80 kg) 179 lb 0.2 oz (81.2 kg)    Labs & Radiologic Studies    CBC  Recent  Labs  09/26/16 1748  WBC 8.6  HGB 9.5*  HCT 28.7*  MCV 92.6  PLT 297   Basic Metabolic Panel  Recent Labs  09/26/16 1748 09/27/16 0202  NA  --  135  K  --  4.1  CL  --  108  CO2  --  22  GLUCOSE  --  87  BUN  --  21*  CREATININE 2.37* 2.46*  CALCIUM  --  7.7*   Liver Function Tests No results for input(s): AST, ALT, ALKPHOS, BILITOT, PROT, ALBUMIN in the last 72 hours. No results for input(s): LIPASE, AMYLASE in the last 72 hours. Cardiac Enzymes No results for input(s): CKTOTAL, CKMB, CKMBINDEX, TROPONINI in the last 72 hours. BNP Invalid input(s): POCBNP D-Dimer No results for input(s): DDIMER in the last 72 hours. Hemoglobin A1C No results for input(s): HGBA1C in the last 72 hours. Fasting Lipid Panel No results for input(s): CHOL, HDL, LDLCALC, TRIG, CHOLHDL, LDLDIRECT in the last 72 hours. Thyroid Function Tests No results for input(s): TSH, T4TOTAL, T3FREE, THYROIDAB in the last 72 hours.  Invalid input(s): FREET3 _____________  Dg Chest 2 View  Result Date: 09/23/2016 CLINICAL DATA:  Claudication, preprocedural chest radiograph, history hypertension, diabetes mellitus, coronary artery disease post CABG, former smoker EXAM: CHEST  2 VIEW COMPARISON:  01/06/2016; correlation CT chest 03/26/2016 FINDINGS: Enlargement of cardiac silhouette post CABG. Pulmonary vascular congestion. Atherosclerotic calcification aorta. Underlying emphysematous changes. Minimal pleural thickening or tiny effusions at the lung bases bilaterally. Progressive interstitial changes at both lung bases, likely worsening chronic interstitial lung disease rather than acute infiltrate. Upper lungs clear.  No pneumothorax. Degenerative changes LEFT AC joint with calcification at the LEFT cortical clavicular ligament. 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 Enlargement of cardiac silhouette post CABG. Aortic  atherosclerosis. Electronically Signed   By: Ulyses Southward M.D.   On: 09/23/2016 11:25   Disposition   Pt is being discharged home today in good condition.  Follow-up Plans & Appointments    Follow-up Information    Edward Batty, MD Follow up.   Specialties:  Cardiology, Radiology Why:  Office will contatct you Contact information: [address] [place] [address] [phone]            Discharge Medications   Current Discharge Medication List    START taking these medications   Details  acetaminophen (TYLENOL) 325 MG tablet Take 2 tablets (650 mg total) by mouth every 4 (four) hours as needed for headache or mild pain.    cloNIDine (CATAPRES) 0.1 MG tablet Take 1 tablet (0.1 mg total) by mouth 3 (three) times daily. Qty: 100 tablet, Refills: 11      CONTINUE these medications which have CHANGED   Details  metoprolol (LOPRESSOR) 100 MG tablet Take 0.5 tablets (50 mg total) by mouth 2 (two) times daily.      CONTINUE these medications which have NOT CHANGED   Details  aspirin EC 81 MG tablet Take 81 mg by mouth at bedtime.     Bacitracin-Polymyxin B (POLYSPORIN  EX) Apply 1 application topically daily as needed (wound care).    Coenzyme Q10 (CO Q-10) 100 MG CAPS Take 100 mg by mouth daily.    ezetimibe (ZETIA) 10 MG tablet Take 5 mg by mouth daily.    ferrous sulfate 325 (65 FE) MG tablet Take 325 mg by mouth at bedtime.     nitroGLYCERIN (NITROSTAT) 0.4 MG SL tablet Place 0.4 mg under the tongue every 5 (five) minutes as needed for chest pain.    polyvinyl alcohol (ARTIFICIAL TEARS) 1.4 % ophthalmic solution Place 1 drop into both eyes 2 (two) times daily as needed for dry eyes.      STOP taking these medications     ibuprofen (ADVIL,MOTRIN) 200 MG tablet           Outstanding Labs/Studies     Duration of Discharge Encounter   Greater than 30 minutes including physician time.  Signed, Corine Shelter PA-C 09/28/2016, 9:25 AM

## 2016-09-27 NOTE — Progress Notes (Signed)
Pt continues to have high pressures in the 170s, unstable for discharge. Scheduled clonidine 0.1 mg TID and decreased lopressor to 50 mg for tonight for heart rate in the low 60s. Will likely resume lopressor at home dose on discharge. May need new clonidine prescription for pressure control.   Roe Rutherford Payzlee Ryder, PA-C 09/27/2016, 7:08 PM [phone]

## 2016-09-27 NOTE — Interval H&P Note (Signed)
History and Physical Interval Note:  09/27/2016 9:24 AM  [name]  has presented today for surgery, with the diagnosis of claudication  The various methods of treatment have been discussed with the patient and family. After consideration of risks, benefits and other options for treatment, the patient has consented to  Procedure(s): Lower Extremity Angiography (N/A) as a surgical intervention .  The patient's history has been reviewed, patient examined, no change in status, stable for surgery.  I have reviewed the patient's chart and labs.  Questions were answered to the patient's satisfaction.     Nanetta Batty

## 2016-09-28 ENCOUNTER — Telehealth: Payer: Self-pay | Admitting: *Deleted

## 2016-09-28 DIAGNOSIS — I739 Peripheral vascular disease, unspecified: Secondary | ICD-10-CM | POA: Diagnosis present

## 2016-09-28 DIAGNOSIS — I10 Essential (primary) hypertension: Secondary | ICD-10-CM

## 2016-09-28 MED ORDER — ACETAMINOPHEN 325 MG PO TABS: 650.0000 mg | ORAL_TABLET | ORAL | Status: AC | PRN

## 2016-09-28 MED ORDER — CLONIDINE HCL 0.1 MG PO TABS: 0.1000 mg | ORAL_TABLET | Freq: Three times a day (TID) | ORAL | 11 refills | Status: DC

## 2016-09-28 MED ORDER — METOPROLOL TARTRATE 100 MG PO TABS: 50.0000 mg | ORAL_TABLET | Freq: Two times a day (BID) | ORAL | Status: DC

## 2016-09-28 NOTE — Care Management Note (Signed)
Case Management Note Donn Pierini RN, BSN Unit 2W-Case Manager [phone]  Patient Details  Name: [name] MRN: [id number] Date of Birth: 1941/02/03  Subjective/Objective:  Admitted with bil stenosis to iliac arteries with planned intervention-                   Action/Plan: PTA pt lived at home- plan to d/c back home on discharge- no CM needs noted.   Expected Discharge Date:  09/28/16               Expected Discharge Plan:  Home/Self Care  In-House Referral:     Discharge planning Services  CM Consult  Post Acute Care Choice:  NA Choice offered to:  NA  DME Arranged:    DME Agency:     HH Arranged:    HH Agency:     Status of Service:  Completed, signed off  If discussed at [employer] of Stay Meetings, dates discussed:    Additional Comments:  Darrold Span, RN 09/28/2016, 9:38 AM

## 2016-09-28 NOTE — Telephone Encounter (Signed)
Left message for patient to call and schedule 2-3 week post hospital with Dr. Berry 

## 2016-09-28 NOTE — Progress Notes (Signed)
Removed IV no issues at present. Reviewed d/c instructions with patients. Awaiting wheelchair for discharge.  Ersel Enslin, Charlaine Dalton RN

## 2016-09-28 NOTE — Progress Notes (Signed)
     SUBJECTIVE: No pain this am. No leg pain, No chest pain, no dyspnea  Tele: sinus  BP (!) 177/57 (BP Location: Left Arm)   Pulse (!) 56   Temp 98.2 F (36.8 C) (Oral)   Resp 18   Ht 5\' 8"  (1.727 m)   Wt 179 lb 0.2 oz (81.2 kg)   SpO2 92%   BMI 27.22 kg/m   Intake/Output Summary (Last 24 hours) at 09/28/16 0848 Last data filed at 09/27/16 2105  Gross per 24 hour  Intake           831.97 ml  Output             2100 ml  Net         -1268.03 ml    PHYSICAL EXAM General: Well developed, well nourished, in no acute distress. Alert and oriented x 3.  Psych:  Good affect, responds appropriately Neck: No JVD. No masses noted.  Lungs: Clear bilaterally with no wheezes or rhonci noted.  Heart: RRR with no murmurs noted. Abdomen: Bowel sounds are present. Soft, non-tender.  Extremities: No lower extremity edema. Right groin without hematoma  LABS: Basic Metabolic Panel:  Recent Labs  76/81/15 1748 09/27/16 0202  NA  --  135  K  --  4.1  CL  --  108  CO2  --  22  GLUCOSE  --  87  BUN  --  21*  CREATININE 2.37* 2.46*  CALCIUM  --  7.7*   CBC:  Recent Labs  09/26/16 1748  WBC 8.6  HGB 9.5*  HCT 28.7*  MCV 92.6  PLT 297   Current Meds: . aspirin EC  81 mg Oral QHS  . Chlorhexidine Gluconate Cloth  6 each Topical Daily  . cloNIDine  0.1 mg Oral TID  . ezetimibe  5 mg Oral Daily  . ferrous sulfate  325 mg Oral QHS  . heparin  5,000 Units Subcutaneous Q8H  . metoprolol  50 mg Oral BID  . mupirocin ointment  1 application Nasal BID    ASSESSMENT AND PLAN:  1. PAD with claudication:  He was admitted prior to his planned LE angiography for hydration and kept following his procedure due to elevated BP. He was found to have moderately severe, hemodynamically significant heavily calcified stenosis of the bilateral iliac arteries near the ostium of both vessels. Dr. Allyson Sabal is planning staged intervention of both iliac arteries at a later date.   2. HTN: Pt with  uncontrolled HTN at home. He by his own report is allergic to many anti-hypertensives. I will continue metoprolol 50 mg po BID. Will continue clonidine 0.1 mg po TID that was started during this hospitalization.    Discharge home today. Follow up in 2-3 weeks with Dr. Allyson Sabal to discuss intervention  [name]  5/1/20188:48 AM

## 2016-10-13 ENCOUNTER — Ambulatory Visit (INDEPENDENT_AMBULATORY_CARE_PROVIDER_SITE_OTHER): Payer: No Typology Code available for payment source | Admitting: Cardiovascular Disease

## 2016-10-13 ENCOUNTER — Encounter: Payer: Self-pay | Admitting: Cardiovascular Disease

## 2016-10-13 VITALS — BP 202/68 | HR 51 | Ht 68.0 in | Wt 178.0 lb

## 2016-10-13 DIAGNOSIS — I10 Essential (primary) hypertension: Secondary | ICD-10-CM

## 2016-10-13 DIAGNOSIS — N183 Chronic kidney disease, stage 3 unspecified: Secondary | ICD-10-CM

## 2016-10-13 DIAGNOSIS — I739 Peripheral vascular disease, unspecified: Secondary | ICD-10-CM

## 2016-10-13 NOTE — Progress Notes (Signed)
10/13/2016 [name]   03-14-1941  [id number]  Primary Physician Lupita Raider, MD Primary Cardiologist: Runell Gess MD Roseanne Reno  HPI:  [name] is a delightful 76 year old mildly overweight married Caucasian male father of 3, grandfather of her grandchildren is accompanied by his [place] today. He was referred by Dr. Katrinka Blazing for peripheral vascular evaluation because of lifestyle limiting claudication. I last saw him in the office 09/22/16. He does have a history of treated hypertension and hyperlipidemia. 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 He is on Zetia. He has discomfort in both legs when he walks. Recent Doppler suggested high-frequency signals in both iliac arteries. In addition, he has moderate renal insufficiency. I performed angiography 09/26/16 after being admitted the day before for hydration. 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 I used a total 20 mL of contrast. His serum creatinine was in the mid-2 range. He wishes to have this percutaneously addressed in the near future.   Current Outpatient Prescriptions  Medication Sig Dispense Refill  . acetaminophen (TYLENOL) 325 MG tablet Take 2 tablets (650 mg total) by mouth every 4 (four) hours as needed for headache or mild pain.    Marland Kitchen aspirin EC 81 MG tablet Take 81 mg by mouth at bedtime.     . Bacitracin-Polymyxin B (POLYSPORIN EX) Apply 1 application topically daily as needed (wound care).    . cloNIDine (CATAPRES) 0.1 MG tablet Take 1 tablet (0.1 mg total) by mouth 3 (three) times daily. 100 tablet 11  . Coenzyme Q10 (CO Q-10) 100 MG CAPS Take 100 mg by mouth daily.    Marland Kitchen ezetimibe (ZETIA) 10 MG tablet Take 5 mg by mouth daily.    . ferrous sulfate 325 (65 FE) MG tablet Take 325 mg by mouth at bedtime.     . metoprolol (LOPRESSOR) 100 MG tablet Take 0.5  tablets (50 mg total) by mouth 2 (two) times daily.    . nitroGLYCERIN (NITROSTAT) 0.4 MG SL tablet Place 0.4 mg under the tongue every 5 (five) minutes as needed for chest pain.    . polyvinyl alcohol (ARTIFICIAL TEARS) 1.4 % ophthalmic solution Place 1 drop into both eyes 2 (two) times daily as needed for dry eyes.     No current facility-administered medications for this visit.     Allergies  Allergen Reactions  . 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   . Other Swelling, Rash and Other (See Comments)    Pt states that he is allergic to all -mycins.   Reaction:  Leg swelling   . Statins Other (See Comments)    Reaction:  Muscle pain   . Hydralazine Dermatitis  . Fish Oil Hives    Pt reports no reaction to fish (only fish oil capsules)  . Norvasc [Amlodipine] Swelling and Rash    Reaction:  Leg swelling     Social History   Social History  . Marital status: Married    Spouse name: N/A  . Number of children: N/A  . Years of education: N/A   Occupational History  . Not on file.   Social History Main Topics  . Smoking status: Former Smoker    Packs/day: 1.50    Years: 35.00    Types: Cigarettes    Quit date:  01/08/1994  . Smokeless tobacco: Never Used  . Alcohol use 16.8 oz/week    28 Cans of beer per week     Comment: 3-5 beers everyday.  . Drug use: No  . Sexual activity: Not Currently   Other Topics Concern  . Not on file   Social History Narrative  . No narrative on file     Review of Systems: General: negative for chills, fever, night sweats or weight changes.  Cardiovascular: negative for chest pain, dyspnea on exertion, edema, orthopnea, palpitations, paroxysmal nocturnal dyspnea or shortness of breath Dermatological: negative for rash Respiratory: negative for cough or wheezing Urologic: negative for hematuria Abdominal: negative for nausea, vomiting, diarrhea, bright red blood per rectum, melena,  or hematemesis Neurologic: negative for visual changes, syncope, or dizziness All other systems reviewed and are otherwise negative except as noted above.    Blood pressure (!) 202/68, pulse (!) 51, height 5\' 8"  (1.727 m), weight 178 lb (80.7 kg).  General appearance: alert and no distress Neck: no adenopathy, no carotid bruit, no JVD, supple, symmetrical, trachea midline and thyroid not enlarged, symmetric, no tenderness/mass/nodules Lungs: clear to auscultation bilaterally Heart: regular rate and rhythm, S1, S2 normal, no murmur, click, rub or gallop Extremities: extremities normal, atraumatic, no cyanosis or edema  EKG sinus bradycardia 51 evidence of left ventricular hypertrophy. I personally reviewed this EKG  ASSESSMENT AND PLAN:   PAD (peripheral artery disease) [facility]) [name] returns today after his peripheral angiogram which I performed 09/26/16 for limiting claudication in the setting of aortoiliac disease and moderate renal insufficiency. I did demonstrate a 50% calcified ostial right common iliac artery stenosis with a 30 mm pullback gradient and a 60-70% longer calcified left common iliac artery stenosis. A central 20 mL of contrast. The patient was admitted the day before for hydration. Serum creatinine was in the mid-2 range. My plan is to perform staged bilateral iliac diamondback orbital rotational atherectomy followed by coverage stenting after being admitted the day before for hydration.      Runell Gess MD FACP,FACC,FAHA, [facility] 10/13/2016 11:14 AM

## 2016-10-13 NOTE — Patient Instructions (Addendum)
   [facility] CARDIOVASCULAR DIVISION [facility] [address] [place] [address] Dept: [phone] Loc: [phone]  [name]  10/13/2016  You are scheduled for a Peripheral Angiogram on Thursday, June 7 with Dr. Nanetta Batty.  1. Please arrive at the [facility] (Main Entrance A) at [facility]: [address] [place], [place] [address] at 5:30 AM (two hours before your procedure to ensure your preparation). Free valet parking service is available. Someone from [facility] will call you on the afternoon/evening of June 6 to let you know when a bed is available.  Special note: Every effort is made to have your procedure done on time. Please understand that emergencies sometimes delay scheduled procedures.  2. Diet: Do not eat or drink anything after midnight prior to your procedure except sips of water to take medications.  3. Labs: You will need to have blood drawn on Monday, June 4 at [facility] [address], [place]  Open: 8am - 5pm (Lunch 12:30 - 1:30)   Phone: [phone]. You do not need to be fasting.  4. Medication instructions in preparation for your procedure:  On the morning of your procedure, take your Aspirin and any morning medicines NOT listed above.  You may use sips of water.  5. Plan for one night stay--bring personal belongings. 6. Bring a current list of your medications and current insurance cards. 7. You MUST have a responsible person to drive you home. 8. Someone MUST be with you the first 24 hours after you arrive home or your discharge will be delayed. 9. Please wear clothes that are easy to get on and off and wear slip-on shoes.  Thank you for allowing [place] to care for you!   -- [facility]

## 2016-10-13 NOTE — Addendum Note (Signed)
Addended by: Evans Lance on: 10/13/2016 11:30 AM   Modules accepted: Orders

## 2016-10-13 NOTE — Assessment & Plan Note (Signed)
Mr. Sterchi returns today after his peripheral angiogram which I performed 09/26/16 for limiting claudication in the setting of aortoiliac disease and moderate renal insufficiency. I did demonstrate a 50% calcified ostial right common iliac artery stenosis with a 30 mm pullback gradient and a 60-70% longer calcified left common iliac artery stenosis. A central 20 mL of contrast. The patient was admitted the day before for hydration. Serum creatinine was in the mid-2 range. My plan is to perform staged bilateral iliac diamondback orbital rotational atherectomy followed by coverage stenting after being admitted the day before for hydration.

## 2016-10-20 ENCOUNTER — Telehealth: Payer: Self-pay | Admitting: Cardiovascular Disease

## 2016-10-20 NOTE — Telephone Encounter (Signed)
Spoke to pt. Told pt he will be admitted to the hospital the day prior to procedure for pre-hydration. He will not be outpatient. Pt stated he will call his insurance to let them know and call me back if any further questions. Pt verbalized thanks for the call.

## 2016-10-20 NOTE — Telephone Encounter (Signed)
New message  Patient calling upcoming procedure in June .   Patient was told by MD to check in the hospital 6/6, procedure 6/7 , extra day after in the hospital after that.   Pre -admit called him advise he will be outpatient . Need clarification.

## 2016-10-20 NOTE — Telephone Encounter (Signed)
Patient calling w/questions regarding upcoming PV procedure. Routed to [place], [place]

## 2016-10-27 ENCOUNTER — Other Ambulatory Visit: Payer: Self-pay | Admitting: Cardiovascular Disease

## 2016-10-27 DIAGNOSIS — I739 Peripheral vascular disease, unspecified: Secondary | ICD-10-CM

## 2016-11-01 LAB — CBC WITH DIFFERENTIAL/PLATELET
BASOS ABS: 0 {cells}/uL (ref 0–200)
Basophils Relative: 0 %
EOS ABS: 410 {cells}/uL (ref 15–500)
Eosinophils Relative: 5 %
HEMATOCRIT: 32 % — AB (ref 38.5–50.0)
Hemoglobin: 10.4 g/dL — ABNORMAL LOW (ref 13.2–17.1)
LYMPHS PCT: 13 %
Lymphs Abs: 1066 cells/uL (ref 850–3900)
MCH: 30.3 pg (ref 27.0–33.0)
MCHC: 32.5 g/dL (ref 32.0–36.0)
MCV: 93.3 fL (ref 80.0–100.0)
MONO ABS: 1066 {cells}/uL — AB (ref 200–950)
MONOS PCT: 13 %
MPV: 9.2 fL (ref 7.5–12.5)
Neutro Abs: 5658 cells/uL (ref 1500–7800)
Neutrophils Relative %: 69 %
PLATELETS: 315 10*3/uL (ref 140–400)
RBC: 3.43 MIL/uL — ABNORMAL LOW (ref 4.20–5.80)
RDW: 14.1 % (ref 11.0–15.0)
WBC: 8.2 10*3/uL (ref 3.8–10.8)

## 2016-11-01 LAB — PROTIME-INR
INR: 1
Prothrombin Time: 10.5 s (ref 9.0–11.5)

## 2016-11-01 LAB — BASIC METABOLIC PANEL WITH GFR
BUN: 26 mg/dL — AB (ref 7–25)
CALCIUM: 8.3 mg/dL — AB (ref 8.6–10.3)
CO2: 24 mmol/L (ref 20–31)
CREATININE: 2.64 mg/dL — AB (ref 0.70–1.18)
Chloride: 103 mmol/L (ref 98–110)
GFR, Est African American: 26 mL/min — ABNORMAL LOW (ref >=60)
GFR, Est Non African American: 23 mL/min — ABNORMAL LOW (ref >=60)
GLUCOSE: 93 mg/dL (ref 65–99)
Potassium: 4.4 mmol/L (ref 3.5–5.3)
Sodium: 136 mmol/L (ref 135–146)

## 2016-11-01 LAB — TSH: TSH: 3.84 mIU/L (ref 0.40–4.50)

## 2016-11-01 LAB — APTT: APTT: 29 s (ref 22–34)

## 2016-11-03 ENCOUNTER — Inpatient Hospital Stay (HOSPITAL_COMMUNITY)
Admission: RE | Admit: 2016-11-03 | Discharge: 2016-11-05 | DRG: 271 | Disposition: A | Discharge status: Home / Self Care | Payer: Medicare Other | Source: Ambulatory Visit | Attending: Interventional Cardiology | Admitting: Interventional Cardiology

## 2016-11-03 ENCOUNTER — Encounter (HOSPITAL_COMMUNITY): Payer: Self-pay | Admitting: Family

## 2016-11-03 DIAGNOSIS — Z87892 Personal history of anaphylaxis: Secondary | ICD-10-CM

## 2016-11-03 DIAGNOSIS — Z7982 Long term (current) use of aspirin: Secondary | ICD-10-CM

## 2016-11-03 DIAGNOSIS — I10 Essential (primary) hypertension: Secondary | ICD-10-CM

## 2016-11-03 DIAGNOSIS — E663 Overweight: Secondary | ICD-10-CM | POA: Diagnosis present

## 2016-11-03 DIAGNOSIS — I70213 Atherosclerosis of native arteries of extremities with intermittent claudication, bilateral legs: Principal | ICD-10-CM | POA: Diagnosis present

## 2016-11-03 DIAGNOSIS — I739 Peripheral vascular disease, unspecified: Secondary | ICD-10-CM | POA: Diagnosis present

## 2016-11-03 DIAGNOSIS — N184 Chronic kidney disease, stage 4 (severe): Secondary | ICD-10-CM | POA: Diagnosis not present

## 2016-11-03 DIAGNOSIS — I2510 Atherosclerotic heart disease of native coronary artery without angina pectoris: Secondary | ICD-10-CM | POA: Diagnosis present

## 2016-11-03 DIAGNOSIS — E1151 Type 2 diabetes mellitus with diabetic peripheral angiopathy without gangrene: Secondary | ICD-10-CM | POA: Diagnosis present

## 2016-11-03 DIAGNOSIS — E785 Hyperlipidemia, unspecified: Secondary | ICD-10-CM | POA: Diagnosis present

## 2016-11-03 DIAGNOSIS — Z87891 Personal history of nicotine dependence: Secondary | ICD-10-CM

## 2016-11-03 DIAGNOSIS — Z888 Allergy status to other drugs, medicaments and biological substances status: Secondary | ICD-10-CM

## 2016-11-03 DIAGNOSIS — Z951 Presence of aortocoronary bypass graft: Secondary | ICD-10-CM

## 2016-11-03 DIAGNOSIS — I129 Hypertensive chronic kidney disease with stage 1 through stage 4 chronic kidney disease, or unspecified chronic kidney disease: Secondary | ICD-10-CM | POA: Diagnosis present

## 2016-11-03 DIAGNOSIS — I252 Old myocardial infarction: Secondary | ICD-10-CM

## 2016-11-03 DIAGNOSIS — E1122 Type 2 diabetes mellitus with diabetic chronic kidney disease: Secondary | ICD-10-CM | POA: Diagnosis present

## 2016-11-03 DIAGNOSIS — E871 Hypo-osmolality and hyponatremia: Secondary | ICD-10-CM | POA: Diagnosis present

## 2016-11-03 DIAGNOSIS — Z91018 Allergy to other foods: Secondary | ICD-10-CM

## 2016-11-03 DIAGNOSIS — Z6825 Body mass index (BMI) 25.0-25.9, adult: Secondary | ICD-10-CM

## 2016-11-03 LAB — CBC
HCT: 30.6 % — ABNORMAL LOW (ref 39.0–52.0)
Hemoglobin: 10 g/dL — ABNORMAL LOW (ref 13.0–17.0)
MCH: 29.6 pg (ref 26.0–34.0)
MCHC: 32.7 g/dL (ref 30.0–36.0)
MCV: 90.5 fL (ref 78.0–100.0)
PLATELETS: 301 10*3/uL (ref 150–400)
RBC: 3.38 MIL/uL — ABNORMAL LOW (ref 4.22–5.81)
RDW: 12.9 % (ref 11.5–15.5)
WBC: 9.9 10*3/uL (ref 4.0–10.5)

## 2016-11-03 LAB — BASIC METABOLIC PANEL
Anion gap: 9 (ref 5–15)
BUN: 21 mg/dL — AB (ref 6–20)
CHLORIDE: 101 mmol/L (ref 101–111)
CO2: 23 mmol/L (ref 22–32)
Calcium: 8.1 mg/dL — ABNORMAL LOW (ref 8.9–10.3)
Creatinine, Ser: 2.62 mg/dL — ABNORMAL HIGH (ref 0.61–1.24)
GFR calc Af Amer: 26 mL/min — ABNORMAL LOW (ref >60)
GFR, EST NON AFRICAN AMERICAN: 22 mL/min — AB (ref >60)
Glucose, Bld: 149 mg/dL — ABNORMAL HIGH (ref 65–99)
Potassium: 3.9 mmol/L (ref 3.5–5.1)
SODIUM: 133 mmol/L — AB (ref 135–145)

## 2016-11-03 MED ORDER — SODIUM CHLORIDE 0.9 % WEIGHT BASED INFUSION
1.0000 mL/kg/h | INTRAVENOUS | Status: DC
  Administered 2016-11-04: 1 mL/kg/h via INTRAVENOUS

## 2016-11-03 MED ORDER — ASPIRIN 81 MG PO CHEW
81.0000 mg | CHEWABLE_TABLET | ORAL | Status: AC
  Administered 2016-11-04: 81 mg via ORAL
  Filled 2016-11-03: qty 1

## 2016-11-03 MED ORDER — FERROUS SULFATE 325 (65 FE) MG PO TABS
325.0000 mg | ORAL_TABLET | Freq: Every day | ORAL | Status: DC
  Administered 2016-11-03 – 2016-11-04 (×2): 325 mg via ORAL
  Filled 2016-11-03 (×2): qty 1

## 2016-11-03 MED ORDER — HYDRALAZINE HCL 20 MG/ML IJ SOLN
10.0000 mg | INTRAMUSCULAR | Status: DC | PRN
  Administered 2016-11-03 – 2016-11-04 (×3): 10 mg via INTRAVENOUS
  Filled 2016-11-03 (×4): qty 1

## 2016-11-03 MED ORDER — SODIUM CHLORIDE 0.9% FLUSH: 3.0000 mL | INTRAVENOUS | Status: DC | PRN

## 2016-11-03 MED ORDER — SODIUM CHLORIDE 0.9 % WEIGHT BASED INFUSION: 3.0000 mL/kg/h | INTRAVENOUS | Status: DC

## 2016-11-03 MED ORDER — ONDANSETRON HCL 4 MG/2ML IJ SOLN: 4.0000 mg | Freq: Four times a day (QID) | INTRAMUSCULAR | Status: DC | PRN

## 2016-11-03 MED ORDER — ASPIRIN EC 81 MG PO TBEC: 81.0000 mg | DELAYED_RELEASE_TABLET | Freq: Every day | ORAL | Status: DC

## 2016-11-03 MED ORDER — CLONIDINE HCL 0.1 MG PO TABS
0.1000 mg | ORAL_TABLET | Freq: Three times a day (TID) | ORAL | Status: DC
  Administered 2016-11-03 – 2016-11-05 (×6): 0.1 mg via ORAL
  Filled 2016-11-03 (×6): qty 1

## 2016-11-03 MED ORDER — EZETIMIBE 10 MG PO TABS
5.0000 mg | ORAL_TABLET | Freq: Every day | ORAL | Status: DC
  Administered 2016-11-04 – 2016-11-05 (×2): 5 mg via ORAL
  Filled 2016-11-03 (×2): qty 1

## 2016-11-03 MED ORDER — ENOXAPARIN SODIUM 30 MG/0.3ML ~~LOC~~ SOLN
30.0000 mg | SUBCUTANEOUS | Status: DC
  Administered 2016-11-03 – 2016-11-05 (×2): 30 mg via SUBCUTANEOUS
  Filled 2016-11-03 (×3): qty 0.3

## 2016-11-03 MED ORDER — SODIUM CHLORIDE 0.9 % IV SOLN
INTRAVENOUS | Status: DC
  Administered 2016-11-03 – 2016-11-04 (×2): via INTRAVENOUS

## 2016-11-03 MED ORDER — ACETAMINOPHEN 325 MG PO TABS: 650.0000 mg | ORAL_TABLET | ORAL | Status: DC | PRN

## 2016-11-03 NOTE — Progress Notes (Signed)
Pt received from admitting. Telemetry applied, CCMD notified. Patient and wife oriented to room, equipment, and plan of care. Vital signs taken - blood pressure high - per patient this is baseline. Paged cardiology.  Leonidas Romberg, RN

## 2016-11-03 NOTE — H&P (Addendum)
The patient has been seen in conjunction with Laverda Page, NP. All aspects of care have been considered and discussed. The patient has been personally interviewed, examined, and all clinical data has been reviewed.   [name] is well known to me, as I am his primary cardiologist. He has coronary artery disease and was recently referred because of claudication.  His medical problems include CAD with prior bypass, severe hypertension, chronic kidney disease stage IV, and hyperlipidemia. He has multiple allergies/medication intolerances including including diuretics, hydralazine, and amlodipine.  He is brought in today for hydration prior to peripheral arterial intervention.  Blood pressures extremely elevated today. We will use IV hydralazine for blood pressure control, avoid diuretics, and use IV labetalol only if absolutely necessary. Has previously had a symptomatic severe bradycardia with beta blocker therapy.   Precath orders have been written.   10/13/16 CHIJIOKE LASSER   [birth year]  [id number]  Primary Physician Lupita Raider, MD Primary Cardiologist: Romeo Rabon MD Roseanne Reno  HPI:  [name] is a delightful 76 year old mildly overweight married Caucasian male father of 3, grandfather of her grandchildren is accompanied by his [place] today. He was referred by Dr. Katrinka Blazing for peripheral vascular evaluation because of lifestyle limiting claudication. I last saw him in the office 09/22/16. He does have a history of treated hypertension and hyperlipidemia. 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 He is on Zetia. He has discomfort in both legs when he walks. Recent Doppler suggested high-frequency signals in both iliac arteries. In addition, he has moderate renal insufficiency. I performed angiography 09/26/16 after being admitted the day before for  hydration. 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 I used a total 20 mL of contrast. His serum creatinine was in the mid-2 range. He wishes to have this percutaneously addressed in the near future.   Current Facility-Administered Medications  Medication Dose Route Frequency Provider Last Rate Last Dose  . 0.9 %  sodium chloride infusion   Intravenous Continuous Arty Baumgartner, NP      . Melene Muller ON 11/04/2016] 0.9% sodium chloride infusion  3 mL/kg/hr Intravenous Continuous Runell Gess, MD       Followed by  . [START ON 11/04/2016] 0.9% sodium chloride infusion  1 mL/kg/hr Intravenous Continuous Runell Gess, MD      . acetaminophen (TYLENOL) tablet 650 mg  650 mg Oral Q4H PRN Arty Baumgartner, NP      . Melene Muller ON 11/04/2016] aspirin chewable tablet 81 mg  81 mg Oral Leonie Man, MD      . Melene Muller ON 11/04/2016] aspirin EC tablet 81 mg  81 mg Oral Daily Laverda Page B, NP      . cloNIDine (CATAPRES) tablet 0.1 mg  0.1 mg Oral TID Laverda Page B, NP      . enoxaparin (LOVENOX) injection 30 mg  30 mg Subcutaneous Q24H Laverda Page B, NP      . Melene Muller ON 11/04/2016] ezetimibe (ZETIA) tablet 5 mg  5 mg Oral Daily Laverda Page B, NP      . ferrous sulfate tablet 325 mg  325 mg Oral QHS Laverda Page B, NP      . hydrALAZINE (APRESOLINE) injection 10 mg  10 mg Intravenous Q4H PRN Laverda Page B, NP      . ondansetron (ZOFRAN) injection 4 mg  4 mg Intravenous Q6H PRN Laverda Page B, NP      . sodium chloride flush (NS) 0.9 % injection 3 mL  3 mL Intravenous PRN Runell Gess, MD        Allergies  Allergen Reactions  . 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   . Other Swelling, Rash and Other (See Comments)    Pt states that he is allergic to all -mycins.   Reaction:  Leg swelling   . Statins Other (See Comments)    Reaction:  Muscle pain   . Hydralazine  Dermatitis  . Fish Oil Hives    Pt reports no reaction to fish (only fish oil capsules)  . Norvasc [Amlodipine] Swelling and Rash    Reaction:  Leg swelling     Social History   Social History  . Marital status: Married    Spouse name: N/A  . Number of children: N/A  . Years of education: N/A   Occupational History  . Not on file.   Social History Main Topics  . Smoking status: Former Smoker    Packs/day: 1.50    Years: 35.00    Types: Cigarettes    Quit date: 01/08/1994  . Smokeless tobacco: Never Used  . Alcohol use 16.8 oz/week    28 Cans of beer per week     Comment: 3-5 beers everyday.  . Drug use: No  . Sexual activity: Not Currently   Other Topics Concern  . Not on file   Social History Narrative  . No narrative on file     Review of Systems: General: negative for chills, fever, night sweats or weight changes.  Cardiovascular: negative for chest pain, dyspnea on exertion, edema, orthopnea, palpitations, paroxysmal nocturnal dyspnea or shortness of breath Dermatological: negative for rash Respiratory: negative for cough or wheezing Urologic: negative for hematuria Abdominal: negative for nausea, vomiting, diarrhea, bright red blood per rectum, melena, or hematemesis Neurologic: negative for visual changes, syncope, or dizziness All other systems reviewed and are otherwise negative except as noted above.    Blood pressure (!) 209/79, pulse 74, temperature 97.5 F (36.4 C), temperature source Oral, resp. rate 18, height 5\' 8"  (1.727 m), weight 171 lb 15.3 oz (78 kg), SpO2 98 %.  General appearance: alert and no distress Neck: no adenopathy, no carotid bruit, no JVD, supple, symmetrical, trachea midline and thyroid not enlarged, symmetric, no tenderness/mass/nodules Lungs: clear to auscultation bilaterally Heart: regular rate and rhythm, S1, S2 normal, no murmur, click, rub or gallop Extremities: extremities normal, atraumatic, no cyanosis, mild 1+ LE edema  bilaterally  EKG sinus bradycardia 51 evidence of left ventricular hypertrophy. I personally reviewed this EKG  ASSESSMENT AND PLAN:   PAD (peripheral artery disease) [facility]) Mr. Hasek returns today after his peripheral angiogram which I performed 09/26/16 for limiting claudication in the setting of aortoiliac disease and moderate renal insufficiency. I did demonstrate a 50% calcified ostial right common iliac artery stenosis with a 30 mm pullback gradient and a 60-70% longer calcified left common iliac artery stenosis. A central 20 mL of contrast. The patient was admitted the day before for hydration. Serum creatinine was in the mid-2 range. My plan is to perform staged bilateral iliac diamondback orbital rotational atherectomy followed by coverage stenting after being admitted the day before for hydration.   Runell Gess MD FACP,FACC,FAHA, FSCAI 10/13/16 11:14 AM  Addendum 11/03/16:   Plan to admit  today for IV hydration pre cath for PV angiogram in the morning.  -- Start NS at 5ml/hr.  -- continue clonidine, has metoprolol listed as home medication but states he has not been taking, as he was told to stop by PCP. I -- IV hydralazine 10mg  q4hr (known dermatitis allergy with chronic use, but discussed with Dr. Katrinka Blazing and patient) -- NPO at midnight -- Pre-cath orders in place -- check BMET/CBC now, and repeat in the morning  General: Well developed, well nourished, male appearing in no acute distress. Head: Normocephalic, atraumatic.  Neck: Supple without bruits, JVD. Lungs:  Resp regular and unlabored, CTA. Heart: RRR, S1, S2, no S3, S4, or murmur; no rub. Abdomen: Soft, non-tender, non-distended with normoactive bowel sounds. No hepatomegaly. No rebound/guarding. No obvious abdominal masses. Extremities: No clubbing, cyanosis, 1+ bilateral LE edema. Distal pedal pulses are 2+ bilaterally. Neuro: Alert and oriented X 3. Moves all extremities spontaneously. Psych: Normal  affect.  Janice Coffin, NP-C 11/03/2016, 4:42 PM Pager: [phone]

## 2016-11-04 ENCOUNTER — Encounter (HOSPITAL_COMMUNITY)
Admission: RE | Disposition: A | Discharge status: Home / Self Care | Payer: Self-pay | Source: Ambulatory Visit | Attending: Cardiovascular Disease

## 2016-11-04 ENCOUNTER — Encounter (HOSPITAL_COMMUNITY): Payer: Self-pay | Admitting: Cardiovascular Disease

## 2016-11-04 DIAGNOSIS — I739 Peripheral vascular disease, unspecified: Secondary | ICD-10-CM | POA: Diagnosis not present

## 2016-11-04 DIAGNOSIS — I10 Essential (primary) hypertension: Secondary | ICD-10-CM | POA: Diagnosis not present

## 2016-11-04 DIAGNOSIS — I70213 Atherosclerosis of native arteries of extremities with intermittent claudication, bilateral legs: Secondary | ICD-10-CM | POA: Diagnosis not present

## 2016-11-04 DIAGNOSIS — N184 Chronic kidney disease, stage 4 (severe): Secondary | ICD-10-CM | POA: Diagnosis not present

## 2016-11-04 HISTORY — PX: LOWER EXTREMITY INTERVENTION: CATH118252

## 2016-11-04 LAB — CBC
HCT: 27.6 % — ABNORMAL LOW (ref 39.0–52.0)
Hemoglobin: 9.1 g/dL — ABNORMAL LOW (ref 13.0–17.0)
MCH: 30.2 pg (ref 26.0–34.0)
MCHC: 33 g/dL (ref 30.0–36.0)
MCV: 91.7 fL (ref 78.0–100.0)
PLATELETS: 285 10*3/uL (ref 150–400)
RBC: 3.01 MIL/uL — AB (ref 4.22–5.81)
RDW: 13.4 % (ref 11.5–15.5)
WBC: 8.8 10*3/uL (ref 4.0–10.5)

## 2016-11-04 LAB — BASIC METABOLIC PANEL
Anion gap: 8 (ref 5–15)
BUN: 19 mg/dL (ref 6–20)
CALCIUM: 7.9 mg/dL — AB (ref 8.9–10.3)
CO2: 21 mmol/L — AB (ref 22–32)
CREATININE: 2.71 mg/dL — AB (ref 0.61–1.24)
Chloride: 105 mmol/L (ref 101–111)
GFR calc non Af Amer: 21 mL/min — ABNORMAL LOW (ref >60)
GFR, EST AFRICAN AMERICAN: 25 mL/min — AB (ref >60)
Glucose, Bld: 108 mg/dL — ABNORMAL HIGH (ref 65–99)
Potassium: 4.2 mmol/L (ref 3.5–5.1)
SODIUM: 134 mmol/L — AB (ref 135–145)

## 2016-11-04 LAB — GLUCOSE, CAPILLARY: Glucose-Capillary: 96 mg/dL (ref 65–99)

## 2016-11-04 LAB — POCT ACTIVATED CLOTTING TIME
ACTIVATED CLOTTING TIME: 186 s
ACTIVATED CLOTTING TIME: 213 s
ACTIVATED CLOTTING TIME: 235 s
ACTIVATED CLOTTING TIME: 235 s

## 2016-11-04 SURGERY — LOWER EXTREMITY INTERVENTION
Anesthesia: LOCAL

## 2016-11-04 MED ORDER — CLOPIDOGREL BISULFATE 75 MG PO TABS
75.0000 mg | ORAL_TABLET | Freq: Every day | ORAL | Status: DC
  Administered 2016-11-05: 09:00:00 75 mg via ORAL
  Filled 2016-11-04: qty 1

## 2016-11-04 MED ORDER — NITROGLYCERIN IN D5W 200-5 MCG/ML-% IV SOLN
INTRAVENOUS | Status: AC
  Filled 2016-11-04: qty 250

## 2016-11-04 MED ORDER — LIDOCAINE HCL (PF) 1 % IJ SOLN
INTRAMUSCULAR | Status: DC | PRN
  Administered 2016-11-04: 50 mL

## 2016-11-04 MED ORDER — NITROGLYCERIN 1 MG/10 ML FOR IR/CATH LAB
INTRA_ARTERIAL | Status: DC | PRN
  Administered 2016-11-04: 200 ug via INTRA_ARTERIAL

## 2016-11-04 MED ORDER — CLOPIDOGREL BISULFATE 300 MG PO TABS
ORAL_TABLET | ORAL | Status: DC | PRN
Start: 2016-11-04 — End: 2016-11-04
  Administered 2016-11-04: 300 mg via ORAL

## 2016-11-04 MED ORDER — ANGIOPLASTY BOOK
Freq: Once | Status: AC
  Administered 2016-11-04: 22:00:00
  Filled 2016-11-04: qty 1

## 2016-11-04 MED ORDER — MIDAZOLAM HCL 2 MG/2ML IJ SOLN
INTRAMUSCULAR | Status: AC
  Filled 2016-11-04: qty 2

## 2016-11-04 MED ORDER — NITROGLYCERIN 1 MG/10 ML FOR IR/CATH LAB
INTRA_ARTERIAL | Status: AC
  Filled 2016-11-04: qty 10

## 2016-11-04 MED ORDER — ASPIRIN EC 81 MG PO TBEC
81.0000 mg | DELAYED_RELEASE_TABLET | Freq: Every day | ORAL | Status: DC
  Administered 2016-11-05: 09:00:00 81 mg via ORAL
  Filled 2016-11-04 (×2): qty 1

## 2016-11-04 MED ORDER — LIDOCAINE HCL 1 % IJ SOLN
INTRAMUSCULAR | Status: AC
  Filled 2016-11-04: qty 20

## 2016-11-04 MED ORDER — ACETAMINOPHEN 325 MG PO TABS
650.0000 mg | ORAL_TABLET | ORAL | Status: DC | PRN
  Administered 2016-11-04: 650 mg via ORAL
  Filled 2016-11-04: qty 2

## 2016-11-04 MED ORDER — ONDANSETRON HCL 4 MG/2ML IJ SOLN: 4.0000 mg | Freq: Four times a day (QID) | INTRAMUSCULAR | Status: DC | PRN

## 2016-11-04 MED ORDER — HEPARIN (PORCINE) IN NACL 2-0.9 UNIT/ML-% IJ SOLN
INTRAMUSCULAR | Status: DC | PRN
  Administered 2016-11-04: 1000 mL

## 2016-11-04 MED ORDER — VIPERSLIDE LUBRICANT OPTIME
TOPICAL | Status: DC | PRN
  Administered 2016-11-04: 08:00:00 via SURGICAL_CAVITY

## 2016-11-04 MED ORDER — FENTANYL CITRATE (PF) 100 MCG/2ML IJ SOLN
INTRAMUSCULAR | Status: AC
  Filled 2016-11-04: qty 2

## 2016-11-04 MED ORDER — CLOPIDOGREL BISULFATE 300 MG PO TABS
ORAL_TABLET | ORAL | Status: AC
  Filled 2016-11-04: qty 1

## 2016-11-04 MED ORDER — HEPARIN SODIUM (PORCINE) 1000 UNIT/ML IJ SOLN
INTRAMUSCULAR | Status: DC | PRN
  Administered 2016-11-04: 6000 [IU] via INTRAVENOUS
  Administered 2016-11-04 (×2): 4000 [IU] via INTRAVENOUS

## 2016-11-04 MED ORDER — NITROGLYCERIN IN D5W 200-5 MCG/ML-% IV SOLN
INTRAVENOUS | Status: AC | PRN
  Administered 2016-11-04: 10 ug/min via INTRAVENOUS

## 2016-11-04 MED ORDER — SODIUM CHLORIDE 0.45 % IV SOLN
INTRAVENOUS | Status: DC
  Administered 2016-11-04: 10:00:00 via INTRAVENOUS

## 2016-11-04 MED ORDER — MIDAZOLAM HCL 2 MG/2ML IJ SOLN
INTRAMUSCULAR | Status: DC | PRN
  Administered 2016-11-04: 1 mg via INTRAVENOUS

## 2016-11-04 MED ORDER — VERAPAMIL HCL 2.5 MG/ML IV SOLN
INTRAVENOUS | Status: AC
  Filled 2016-11-04: qty 2

## 2016-11-04 MED ORDER — HEPARIN SODIUM (PORCINE) 1000 UNIT/ML IJ SOLN
INTRAMUSCULAR | Status: AC
  Filled 2016-11-04: qty 1

## 2016-11-04 MED ORDER — HEPARIN (PORCINE) IN NACL 2-0.9 UNIT/ML-% IJ SOLN
INTRAMUSCULAR | Status: AC
  Filled 2016-11-04: qty 1000

## 2016-11-04 MED ORDER — FENTANYL CITRATE (PF) 100 MCG/2ML IJ SOLN
INTRAMUSCULAR | Status: DC | PRN
  Administered 2016-11-04: 25 ug via INTRAVENOUS

## 2016-11-04 MED ORDER — ISOSORBIDE MONONITRATE ER 30 MG PO TB24
30.0000 mg | ORAL_TABLET | Freq: Every day | ORAL | Status: DC
  Administered 2016-11-04 – 2016-11-05 (×2): 30 mg via ORAL
  Filled 2016-11-04 (×2): qty 1

## 2016-11-04 MED ORDER — IODIXANOL 320 MG/ML IV SOLN
INTRAVENOUS | Status: DC | PRN
  Administered 2016-11-04: 40 mL via INTRA_ARTERIAL

## 2016-11-04 SURGICAL SUPPLY — 18 items
BALLN MUSTANG 8.0X40 75 (BALLOONS) ×4
BALLOON MUSTANG 8.0X40 75 (BALLOONS) ×2 IMPLANT
CATH ANGIO 5F PIGTAIL 65CM (CATHETERS) ×2 IMPLANT
CATH STRAIGHT 5FR 65CM (CATHETERS) ×2 IMPLANT
DIAMONDBACK SOLID OAS 2.0MM (CATHETERS) ×2
KIT ENCORE 26 ADVANTAGE (KITS) ×4 IMPLANT
KIT PV (KITS) ×2 IMPLANT
SHEATH BRITE TIP 7FR 35CM (SHEATH) ×4 IMPLANT
SHEATH PINNACLE 7F 10CM (SHEATH) ×4 IMPLANT
STENT VIABAHN 7X39X80 VBX (Permanent Stent) ×2 IMPLANT
STENT VIABAHN VBX 7X59X80 (Permanent Stent) ×2 IMPLANT
STOPCOCK MORSE 400PSI 3WAY (MISCELLANEOUS) ×2 IMPLANT
SYSTEM DIMNDBCK SLD OAS 2.0MM (CATHETERS) ×1 IMPLANT
TRANSDUCER W/STOPCOCK (MISCELLANEOUS) ×2 IMPLANT
TRAY PV CATH (CUSTOM PROCEDURE TRAY) ×2 IMPLANT
TUBING CIL FLEX 10 FLL-RA (TUBING) ×2 IMPLANT
WIRE HITORQ VERSACORE ST 145CM (WIRE) ×4 IMPLANT
WIRE VIPER WIRECTO 0.014 (WIRE) ×2 IMPLANT

## 2016-11-04 NOTE — Progress Notes (Signed)
Site area: left groin  Site Prior to Removal:  Level 0  Pressure Applied For 20 MINUTES    Minutes Beginning at 1255  Manual:   Yes.    Patient Status During Pull:  Patient remained A&O by four, unlabored breathing noted.   Post Pull Groin Site:  Level 0  Post Pull Instructions Given:  Yes.    Post Pull Pulses Present:  Yes.    Dressing Applied:  Yes.    Comments:  Dressing applied to site in place C/D/I. Patient tolerated well. NO bleeding no hematoma noted. Site soft to touch. No S/S of distress noted or complaints voiced.   Site area: right groin  Site Prior to Removal:  Level 0  Pressure Applied For 20 MINUTES    Minutes Beginning at 1320  Manual:   Yes.    Patient Status During Pull:  Patient remains A&O by four. Unlabored breathing noted.   Post Pull Groin Site:  Level 0  Post Pull Instructions Given:  Yes.    Post Pull Pulses Present:  Yes.    Dressing Applied:  Yes.    Comments:  Dressing applied to site C/D/I, patient tolerated well. NO s/s of distress noted or complaints voiced. No bleeding, no hematoma noted. Site soft to touch.

## 2016-11-04 NOTE — Progress Notes (Signed)
Patient's stent card given to his wife at this time.

## 2016-11-04 NOTE — Interval H&P Note (Signed)
History and Physical Interval Note:  11/04/2016 7:34 AM  [name]  has presented today for surgery, with the diagnosis of pad  The various methods of treatment have been discussed with the patient and family. After consideration of risks, benefits and other options for treatment, the patient has consented to  Procedure(s): Lower Extremity Intervention (N/A) as a surgical intervention .  The patient's history has been reviewed, patient examined, no change in status, stable for surgery.  I have reviewed the patient's chart and labs.  Questions were answered to the patient's satisfaction.     Nanetta Batty

## 2016-11-04 NOTE — Progress Notes (Signed)
Progress Note  Patient Name: [name] Date of Encounter: 11/04/2016  Primary Cardiologist: Rexene Edison. Halden Phegley/ Erlene Quan  Subjective   Feels well. Denies angina. He did not sleep well due to continuous eruption. Concern about blood pressure on night by nursing staff.  Inpatient Medications    Scheduled Meds: . [START ON 11/05/2016] aspirin EC  81 mg Oral Daily  . cloNIDine  0.1 mg Oral TID  . [START ON 11/05/2016] clopidogrel  75 mg Oral Q breakfast  . enoxaparin (LOVENOX) injection  30 mg Subcutaneous Q24H  . ezetimibe  5 mg Oral Daily  . ferrous sulfate  325 mg Oral QHS   Continuous Infusions: . sodium chloride 75 mL/hr at 11/04/16 1016  . sodium chloride Stopped (11/03/16 2355)   PRN Meds: acetaminophen, hydrALAZINE, ondansetron (ZOFRAN) IV   Vital Signs    Vitals:   11/04/16 0945 11/04/16 0959 11/04/16 1000 11/04/16 1015  BP: (!) 198/76 (!) 198/76 (!) 217/71 (!) 184/59  Pulse: 70  70 71  Resp: (!) 21  17 18   Temp:      TempSrc:      SpO2: 98%  95% 96%  Weight:      Height:        Intake/Output Summary (Last 24 hours) at 11/04/16 1021 Last data filed at 11/04/16 1016  Gross per 24 hour  Intake           1055.5 ml  Output                0 ml  Net           1055.5 ml   Filed Weights   11/03/16 1447  Weight: 171 lb 15.3 oz (78 kg)    Telemetry    Sinus bradycardia - Personally Reviewed  ECG    No new study - Personally Reviewed  Physical Exam  Skin color is pink and patient is comfortable. Vascular access site is unremarkable. GEN: No acute distress.   Neck: No JVD Cardiac: RRR, no murmurs, rubs, or gallops.  Respiratory: Clear to auscultation bilaterally. GI: Soft, nontender, non-distended  MS: No edema; No deformity. Neuro:  Nonfocal  Psych: Normal affect   Labs    Chemistry Recent Labs Lab 11/01/16 0838 11/03/16 1834 11/04/16 0226  NA 136 133* 134*  K 4.4 3.9 4.2  CL 103 101 105  CO2 24 23 21*  GLUCOSE 93 149* 108*  BUN 26* 21* 19    CREATININE 2.64* 2.62* 2.71*  CALCIUM 8.3* 8.1* 7.9*  GFRNONAA 23* 22* 21*  GFRAA 26* 26* 25*  ANIONGAP  --  9 8     Hematology Recent Labs Lab 11/01/16 0838 11/03/16 1834 11/04/16 0226  WBC 8.2 9.9 8.8  RBC 3.43* 3.38* 3.01*  HGB 10.4* 10.0* 9.1*  HCT 32.0* 30.6* 27.6*  MCV 93.3 90.5 91.7  MCH 30.3 29.6 30.2  MCHC 32.5 32.7 33.0  RDW 14.1 12.9 13.4  PLT 315 301 285    Cardiac EnzymesNo results for input(s): TROPONINI in the last 168 hours. No results for input(s): TROPIPOC in the last 168 hours.   BNPNo results for input(s): BNP, PROBNP in the last 168 hours.   DDimer No results for input(s): DDIMER in the last 168 hours.   Radiology    No results found.  Cardiac Studies   Conclusions from vascular PCI: 11/04/2016 Final Impression: Successful diamondback orbital rotational atherectomy, PTA and covered stenting of highly calcified bilateral common iliac arteries in the setting of  lifestyle limiting claudication. The patient's serum creatinine was 2.7. He was admitted yesterday for hydration. His creatinine clearance was 25, similar to his prior creatinine clearance of 29 at the time of his diagnostic cath. I did use only 40 mL of contrast. He will need dual antiplatelet therapy.  Patient Profile     76 y.o. male with coronary artery disease status post coronary bypass grafting and documented patent grafts, lifestyle limiting claudication due to peripheral vascular disease, stage III/IV CK D, poorly controlled essential hypertension (unable to use multiple medications due to side effects/intolerance), hyponatremia on diuretic therapy, type 2 diabetes, admitted for hydration prior to lower extremity intervention.  Assessment & Plan    1. PAD treated with diamondback rotational atherectomy/PTA/covered stents with excellent angiographic result and only 40 cc of contrast use. 2. CKD stage IV, will follow with blood work in a.m. and if relatively stable, he will be  discharged. 3. Severe systolic hypertension, will use IV nitroglycerin until ambulatory. He does have a component of orthostatic blood pressure change, occasionally with near-normal pressures when sitting and standing but severely elevated when lying supine.  Signed, Lesleigh Noe, MD  11/04/2016, 10:21 AM

## 2016-11-04 NOTE — Care Management Note (Signed)
Case Management Note  Patient Details  Name: [name] MRN: [id number] Date of Birth: 1940/08/15  Subjective/Objective:   S/p lower ext intervention, will be on plavix.     PCP Lupita Raider               Action/Plan: NCM will follow for dc needs.   Expected Discharge Date:                  Expected Discharge Plan:     In-House Referral:     Discharge planning Services  CM Consult  Post Acute Care Choice:    Choice offered to:     DME Arranged:    DME Agency:     HH Arranged:    HH Agency:     Status of Service:  In process, will continue to follow  If discussed at Long Length of Stay Meetings, dates discussed:    Additional Comments:  Leone Haven, RN 11/04/2016, 10:34 AM

## 2016-11-05 ENCOUNTER — Other Ambulatory Visit: Payer: Self-pay | Admitting: Physician Assistant

## 2016-11-05 DIAGNOSIS — Z91018 Allergy to other foods: Secondary | ICD-10-CM | POA: Diagnosis not present

## 2016-11-05 DIAGNOSIS — E785 Hyperlipidemia, unspecified: Secondary | ICD-10-CM | POA: Diagnosis present

## 2016-11-05 DIAGNOSIS — E1122 Type 2 diabetes mellitus with diabetic chronic kidney disease: Secondary | ICD-10-CM | POA: Diagnosis present

## 2016-11-05 DIAGNOSIS — N184 Chronic kidney disease, stage 4 (severe): Secondary | ICD-10-CM | POA: Diagnosis present

## 2016-11-05 DIAGNOSIS — Z6825 Body mass index (BMI) 25.0-25.9, adult: Secondary | ICD-10-CM | POA: Diagnosis not present

## 2016-11-05 DIAGNOSIS — Z87891 Personal history of nicotine dependence: Secondary | ICD-10-CM | POA: Diagnosis not present

## 2016-11-05 DIAGNOSIS — I739 Peripheral vascular disease, unspecified: Secondary | ICD-10-CM

## 2016-11-05 DIAGNOSIS — I252 Old myocardial infarction: Secondary | ICD-10-CM | POA: Diagnosis not present

## 2016-11-05 DIAGNOSIS — E1151 Type 2 diabetes mellitus with diabetic peripheral angiopathy without gangrene: Secondary | ICD-10-CM | POA: Diagnosis present

## 2016-11-05 DIAGNOSIS — E663 Overweight: Secondary | ICD-10-CM | POA: Diagnosis present

## 2016-11-05 DIAGNOSIS — Z888 Allergy status to other drugs, medicaments and biological substances status: Secondary | ICD-10-CM | POA: Diagnosis not present

## 2016-11-05 DIAGNOSIS — I129 Hypertensive chronic kidney disease with stage 1 through stage 4 chronic kidney disease, or unspecified chronic kidney disease: Secondary | ICD-10-CM | POA: Diagnosis present

## 2016-11-05 DIAGNOSIS — I70213 Atherosclerosis of native arteries of extremities with intermittent claudication, bilateral legs: Secondary | ICD-10-CM | POA: Diagnosis present

## 2016-11-05 DIAGNOSIS — Z7982 Long term (current) use of aspirin: Secondary | ICD-10-CM | POA: Diagnosis not present

## 2016-11-05 DIAGNOSIS — Z87892 Personal history of anaphylaxis: Secondary | ICD-10-CM | POA: Diagnosis not present

## 2016-11-05 DIAGNOSIS — I2510 Atherosclerotic heart disease of native coronary artery without angina pectoris: Secondary | ICD-10-CM | POA: Diagnosis present

## 2016-11-05 DIAGNOSIS — E871 Hypo-osmolality and hyponatremia: Secondary | ICD-10-CM | POA: Diagnosis present

## 2016-11-05 DIAGNOSIS — Z951 Presence of aortocoronary bypass graft: Secondary | ICD-10-CM | POA: Diagnosis not present

## 2016-11-05 LAB — CBC
HCT: 25.2 % — ABNORMAL LOW (ref 39.0–52.0)
HEMOGLOBIN: 8.7 g/dL — AB (ref 13.0–17.0)
MCH: 31.1 pg (ref 26.0–34.0)
MCHC: 34.5 g/dL (ref 30.0–36.0)
MCV: 90 fL (ref 78.0–100.0)
Platelets: 245 10*3/uL (ref 150–400)
RBC: 2.8 MIL/uL — ABNORMAL LOW (ref 4.22–5.81)
RDW: 13.2 % (ref 11.5–15.5)
WBC: 10.1 10*3/uL (ref 4.0–10.5)

## 2016-11-05 LAB — BASIC METABOLIC PANEL
ANION GAP: 7 (ref 5–15)
BUN: 20 mg/dL (ref 6–20)
CALCIUM: 7.8 mg/dL — AB (ref 8.9–10.3)
CO2: 21 mmol/L — AB (ref 22–32)
Chloride: 104 mmol/L (ref 101–111)
Creatinine, Ser: 2.64 mg/dL — ABNORMAL HIGH (ref 0.61–1.24)
GFR calc Af Amer: 25 mL/min — ABNORMAL LOW (ref >60)
GFR calc non Af Amer: 22 mL/min — ABNORMAL LOW (ref >60)
Glucose, Bld: 100 mg/dL — ABNORMAL HIGH (ref 65–99)
Potassium: 3.8 mmol/L (ref 3.5–5.1)
Sodium: 132 mmol/L — ABNORMAL LOW (ref 135–145)

## 2016-11-05 MED ORDER — CLOPIDOGREL BISULFATE 75 MG PO TABS: 75.0000 mg | ORAL_TABLET | Freq: Every day | ORAL | 6 refills | Status: DC

## 2016-11-05 MED ORDER — ISOSORBIDE MONONITRATE ER 30 MG PO TB24: 30.0000 mg | ORAL_TABLET | Freq: Every day | ORAL | 6 refills | Status: DC

## 2016-11-05 NOTE — Discharge Summary (Signed)
The patient has been seen in conjunction with Vin Bhagat, PAC. All aspects of care have been considered and discussed. The patient has been personally interviewed, examined, and all clinical data has been reviewed.   Underwent successful bilateral iliac revascularization. No postprocedural complications. Access site is without evidence of hematoma. Pedal pulses are palpable.  Kidney function, based upon creatinine is improved post procedure.  Plan discharge today with basic metabolic panel and transition of care follow-up in one week.  Content of the discharge summary as documented is accurate.  Discharge Summary    Patient ID: [name],  MRN: [id number], DOB/AGE: 1940-08-26 76 y.o.  Admit date: 11/03/2016 Discharge date: 11/05/2016  Primary Care Provider: Lupita Raider Primary Cardiologist: Dr. Katrinka Blazing PV: Dr. Allyson Sabal  Discharge Diagnoses    Active Problems:   PAD (peripheral artery disease) (HCC)   PVD (peripheral vascular disease) with claudication (HCC)  CAD s/p CABG  HTN with elevated SBP and orthostatic component  Stage III/IV CKD   Allergies Allergies  Allergen Reactions  . 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   . Other Swelling, Rash and Other (See Comments)    Pt states that he is allergic to all -mycins.   Reaction:  Leg swelling   . Statins Other (See Comments)    Reaction:  Muscle pain   . Hydralazine Dermatitis  . Fish Oil Hives    Pt reports no reaction to fish (only fish oil capsules)  . Norvasc [Amlodipine] Swelling and Rash    Reaction:  Leg swelling     Diagnostic Studies/Procedures    PV Angiogram/Intervention Final Impression:Successful diamondback orbital rotational atherectomy, PTA and covered stenting of highly calcified bilateral common iliac arteries in the setting of lifestyle limiting claudication. The patient's serum creatinine was 2.7. He was admitted yesterday for  hydration. His creatinine clearance was 25, similar to his prior creatinine clearance of 29 at the time of his diagnostic cath. I did use only 40 mL of contrast. He will need dual antiplatelet therapy. We will check his renal function the morning and hopefully discharge him home. We will obtain lower extremity are chilled Doppler studies in our [place] office next week and I will see him back in 2-3 weeks thereafter. He left the lab in stable condition.   History of Present Illness     76 y.o.malewith coronary artery disease status post coronary bypass grafting and documented patent grafts, lifestyle limiting claudication due to peripheral vascular disease, stage III/IV CK D, poorly controlled essential hypertension (unable to use multiple medications due to side effects/intolerance), hyponatremia on diuretic therapy, type 2 diabetes, admitted for hydration prior to lower extremity intervention.  Hospital Course     Consultants: None  1. PAD : S/P sucessful diamondback rotational atherectomy/PTA/covered stents with excellent angiographic result and only 40 cc of contrast use. No hematoma. Outpatient LE Arterial doppler and then follow up with Dr. Allyson Sabal. Cath site looks good.   2. CKD stage IV. Scr improved post PCI.   3. Severe systolic hypertension. Continued to be elevated blood pressure. Imdur added to regimen. ? Takes BB. Advised to keep log of BB. Given hx of  component of orthostatic blood pressure change, occasionally with near-normal pressures when sitting and standing but severely elevated when lying supine. Follow closely as outpatient.   4. CAD s/p CABG - No angina  The patient has been seen by Dr. Katrinka Blazing today and deemed  ready for discharge home. All follow-up appointments have been scheduled. Discharge medications are listed below.  _____________   Discharge Vitals Blood pressure (!) 149/58, pulse 75, temperature 97 F (36.1 C), temperature source Oral, resp. rate 16, height  5\' 8"  (1.727 m), weight 169 lb 15.6 oz (77.1 kg), SpO2 98 %.  Filed Weights   11/03/16 1447 11/05/16 0155  Weight: 171 lb 15.3 oz (78 kg) 169 lb 15.6 oz (77.1 kg)    Labs & Radiologic Studies     CBC  Recent Labs  11/04/16 0226 11/05/16 0239  WBC 8.8 10.1  HGB 9.1* 8.7*  HCT 27.6* 25.2*  MCV 91.7 90.0  PLT 285 245   Basic Metabolic Panel  Recent Labs  11/04/16 0226 11/05/16 0239  NA 134* 132*  K 4.2 3.8  CL 105 104  CO2 21* 21*  GLUCOSE 108* 100*  BUN 19 20  CREATININE 2.71* 2.64*  CALCIUM 7.9* 7.8*   Liver Function Tests No results for input(s): AST, ALT, ALKPHOS, BILITOT, PROT, ALBUMIN in the last 72 hours. No results for input(s): LIPASE, AMYLASE in the last 72 hours. Cardiac Enzymes No results for input(s): CKTOTAL, CKMB, CKMBINDEX, TROPONINI in the last 72 hours. BNP Invalid input(s): POCBNP D-Dimer No results for input(s): DDIMER in the last 72 hours. Hemoglobin A1C No results for input(s): HGBA1C in the last 72 hours. Fasting Lipid Panel No results for input(s): CHOL, HDL, LDLCALC, TRIG, CHOLHDL, LDLDIRECT in the last 72 hours. Thyroid Function Tests No results for input(s): TSH, T4TOTAL, T3FREE, THYROIDAB in the last 72 hours.  Invalid input(s): FREET3  No results found.  Disposition   Pt is being discharged home today in good condition.  Follow-up Plans & Appointments    Follow-up Information    Runell Gess, MD. Go on 11/24/2016.   Specialties:  Cardiology, Radiology Why:  @11 :00 post hospital follow up  Contact information: [address] [place] [address] [phone]        [facility]. Go on 11/12/2016.   Specialty:  Cardiology Why:  @10 :00 am for LE doppler Contact information: [address] [place] [address] [phone]         Discharge Instructions    Diet - low sodium heart healthy    Complete by:  As directed    Discharge instructions     Complete by:  As directed    No driving for 48 hours No lifting over 5 lbs for 1 week. No sexual activity for 1 week.  Keep procedure site clean & dry. If you notice increased pain, swelling, bleeding or pus, call/return!  You may shower, but no soaking baths/hot tubs/pools for 1 week.   Increase activity slowly    Complete by:  As directed       Discharge Medications   Current Discharge Medication List    START taking these medications   Details  clopidogrel (PLAVIX) 75 MG tablet Take 1 tablet (75 mg total) by mouth daily with breakfast. Qty: 30 tablet, Refills: 6    isosorbide mononitrate (IMDUR) 30 MG 24 hr tablet Take 1 tablet (30 mg total) by mouth daily. Qty: 30 tablet, Refills: 6      CONTINUE these medications which have NOT CHANGED   Details  aspirin EC 81 MG tablet Take 81 mg by mouth at bedtime.     Bacitracin-Polymyxin B (POLYSPORIN EX) Apply 1 application topically 2 (two) times daily as needed (wound care).  cloNIDine (CATAPRES) 0.1 MG tablet Take 1 tablet (0.1 mg total) by mouth 3 (three) times daily. Qty: 100 tablet, Refills: 11    Coenzyme Q10 (CO Q-10) 100 MG CAPS Take 100 mg by mouth daily.    ezetimibe (ZETIA) 10 MG tablet Take 5 mg by mouth daily.    ferrous sulfate 325 (65 FE) MG tablet Take 325 mg by mouth at bedtime.     ibuprofen (ADVIL,MOTRIN) 200 MG tablet Take 400 mg by mouth 2 (two) times daily as needed for mild pain.    nitroGLYCERIN (NITROSTAT) 0.4 MG SL tablet Place 0.4 mg under the tongue every 5 (five) minutes as needed for chest pain.    Oxymetazoline HCl (NASAL SPRAY NA) Place 1 spray into the nose 2 (two) times daily as needed (nasal congestion).    polyvinyl alcohol (ARTIFICIAL TEARS) 1.4 % ophthalmic solution Place 1 drop into both eyes 2 (two) times daily as needed for dry eyes.    acetaminophen (TYLENOL) 325 MG tablet Take 2 tablets (650 mg total) by mouth every 4 (four) hours as needed for headache or mild pain.    metoprolol  (LOPRESSOR) 100 MG tablet Take 0.5 tablets (50 mg total) by mouth 2 (two) times daily.            Outstanding Labs/Studies   BMET during follow up  Duration of Discharge Encounter   Greater than 30 minutes including physician time.  Signed, Bhagat,Bhavinkumar PA-C 11/05/2016, 10:28 AM

## 2016-11-05 NOTE — Care Management Note (Signed)
Case Management Note  Patient Details  Name: [name] MRN: [id number] Date of Birth: 04/10/1941  Subjective/Objective:   From home with wife, pta indep, S/p lower ext intervention, will be on plavix.     PCP Lupita Raider                       Action/Plan:   Expected Discharge Date:                  Expected Discharge Plan:  Home/Self Care  In-House Referral:     Discharge planning Services  CM Consult  Post Acute Care Choice:    Choice offered to:     DME Arranged:    DME Agency:     HH Arranged:    HH Agency:     Status of Service:  Completed, signed off  If discussed at Long Length of Stay Meetings, dates discussed:    Additional Comments:  Leone Haven, RN 11/05/2016, 8:20 AM

## 2016-11-05 NOTE — Progress Notes (Addendum)
Progress Note  Patient Name: [name] Date of Encounter: 11/05/2016  Primary Cardiologist: Dr. Katrinka Blazing PV: Dr. Allyson Sabal  Subjective   Feeling well. No chest pain, sob or palpitations.   Inpatient Medications    Scheduled Meds: . aspirin EC  81 mg Oral Daily  . cloNIDine  0.1 mg Oral TID  . clopidogrel  75 mg Oral Q breakfast  . enoxaparin (LOVENOX) injection  30 mg Subcutaneous Q24H  . ezetimibe  5 mg Oral Daily  . ferrous sulfate  325 mg Oral QHS  . isosorbide mononitrate  30 mg Oral Daily   Continuous Infusions: . sodium chloride 75 mL/hr at 11/04/16 2208   PRN Meds: acetaminophen, hydrALAZINE, ondansetron (ZOFRAN) IV   Vital Signs    Vitals:   11/05/16 0300 11/05/16 0715 11/05/16 0720 11/05/16 0745  BP: (!) 150/57  (!) 191/65 (!) 202/83  Pulse: 65 68 75   Resp: (!) 24 16 20  (!) 21  Temp:   97 F (36.1 C)   TempSrc:   Oral   SpO2: 91% 90% 98%   Weight:      Height:        Intake/Output Summary (Last 24 hours) at 11/05/16 0913 Last data filed at 11/05/16 0715  Gross per 24 hour  Intake          1346.82 ml  Output              950 ml  Net           396.82 ml   Filed Weights   11/03/16 1447 11/05/16 0155  Weight: 171 lb 15.3 oz (78 kg) 169 lb 15.6 oz (77.1 kg)    Telemetry    Sinus rhythm with PACs - Personally Reviewed  ECG    None today   Physical Exam   GEN: No acute distress.   Neck: No JVD Cardiac: RRR, no murmurs, rubs, or gallops. Right and left groin cath site stable without hematoma Respiratory: Clear to auscultation bilaterally. GI: Soft, nontender, non-distended  MS: No edema; No deformity. Neuro:  Nonfocal  Psych: Normal affect   Labs    Chemistry Recent Labs Lab 11/03/16 1834 11/04/16 0226 11/05/16 0239  NA 133* 134* 132*  K 3.9 4.2 3.8  CL 101 105 104  CO2 23 21* 21*  GLUCOSE 149* 108* 100*  BUN 21* 19 20  CREATININE 2.62* 2.71* 2.64*  CALCIUM 8.1* 7.9* 7.8*  GFRNONAA 22* 21* 22*  GFRAA 26* 25* 25*    ANIONGAP 9 8 7      Hematology Recent Labs Lab 11/03/16 1834 11/04/16 0226 11/05/16 0239  WBC 9.9 8.8 10.1  RBC 3.38* 3.01* 2.80*  HGB 10.0* 9.1* 8.7*  HCT 30.6* 27.6* 25.2*  MCV 90.5 91.7 90.0  MCH 29.6 30.2 31.1  MCHC 32.7 33.0 34.5  RDW 12.9 13.4 13.2  PLT 301 285 245    Cardiac EnzymesNo results for input(s): TROPONINI in the last 168 hours. No results for input(s): TROPIPOC in the last 168 hours.   BNPNo results for input(s): BNP, PROBNP in the last 168 hours.   DDimer No results for input(s): DDIMER in the last 168 hours.   Radiology    No results found.  Cardiac Studies    PV Angiogram/Intervention Final Impression: Successful diamondback orbital rotational atherectomy, PTA and covered stenting of highly calcified bilateral common iliac arteries in the setting of lifestyle limiting claudication. The patient's serum creatinine was 2.7. He was admitted yesterday for hydration. His creatinine  clearance was 25, similar to his prior creatinine clearance of 29 at the time of his diagnostic cath. I did use only 40 mL of contrast. He will need dual antiplatelet therapy. We will check his renal function the morning and hopefully discharge him home. We will obtain lower extremity are chilled Doppler studies in our [place] office next week and I will see him back in 2-3 weeks thereafter. He left the lab in stable condition.  Patient Profile     76 y.o. male with coronary artery disease status post coronary bypass grafting and documented patent grafts, lifestyle limiting claudication due to peripheral vascular disease, stage III/IV CK D, poorly controlled essential hypertension (unable to use multiple medications due to side effects/intolerance), hyponatremia on diuretic therapy, type 2 diabetes, admitted for hydration prior to lower extremity intervention.   Assessment & Plan    1. PAD : S/P sucessful diamondback rotational atherectomy/PTA/covered stents with excellent  angiographic result and only 40 cc of contrast use. No hematoma. Outpatient LE Arterial doppler and then follow up with Dr. Allyson Sabal.   2. CKD stage IV. Scr stable today.   3. Severe systolic hypertension. Continued to be elevated blood pressure. Will review medication with MD given hx of  component of orthostatic blood pressure change, occasionally with near-normal pressures when sitting and standing but severely elevated when lying supine.  Signed, Manson Passey, PA  11/05/2016, 9:13 AM

## 2016-11-08 ENCOUNTER — Other Ambulatory Visit: Payer: Self-pay | Admitting: Cardiovascular Disease

## 2016-11-08 ENCOUNTER — Other Ambulatory Visit: Payer: Self-pay | Admitting: Physician Assistant

## 2016-11-08 DIAGNOSIS — I739 Peripheral vascular disease, unspecified: Secondary | ICD-10-CM

## 2016-11-10 LAB — POCT ACTIVATED CLOTTING TIME
ACTIVATED CLOTTING TIME: 164 s
Activated Clotting Time: 202 seconds
Activated Clotting Time: 186 seconds

## 2016-11-12 ENCOUNTER — Ambulatory Visit (HOSPITAL_COMMUNITY)
Admit: 2016-11-12 | Payer: No Typology Code available for payment source | Attending: Physician Assistant | Admitting: Physician Assistant

## 2016-11-12 ENCOUNTER — Ambulatory Visit (HOSPITAL_COMMUNITY)
Admission: RE | Admit: 2016-11-12 | Discharge: 2016-11-12 | Disposition: A | Discharge status: Home / Self Care | Payer: No Typology Code available for payment source | Source: Ambulatory Visit | Attending: Cardiovascular Disease | Admitting: Cardiovascular Disease

## 2016-11-12 DIAGNOSIS — I739 Peripheral vascular disease, unspecified: Secondary | ICD-10-CM | POA: Diagnosis not present

## 2016-11-15 ENCOUNTER — Other Ambulatory Visit: Payer: Self-pay | Admitting: Cardiovascular Disease

## 2016-11-15 DIAGNOSIS — I739 Peripheral vascular disease, unspecified: Secondary | ICD-10-CM

## 2016-11-24 ENCOUNTER — Encounter: Payer: Self-pay | Admitting: Cardiovascular Disease

## 2016-11-24 ENCOUNTER — Ambulatory Visit (INDEPENDENT_AMBULATORY_CARE_PROVIDER_SITE_OTHER): Payer: No Typology Code available for payment source | Admitting: Cardiovascular Disease

## 2016-11-24 VITALS — BP 152/72 | HR 48 | Ht 68.0 in | Wt 173.0 lb

## 2016-11-24 DIAGNOSIS — I739 Peripheral vascular disease, unspecified: Secondary | ICD-10-CM

## 2016-11-24 NOTE — Progress Notes (Signed)
11/24/2016 [name]   26-Aug-1940  [id number]  Primary Physician Edward Raider, MD Primary Cardiologist: Edward Gess MD [name]  HPI:  Mr. [name] is a delightful 76 year old mildly overweight married Caucasian male father of 3, grandfather of her grandchildren is accompanied by his [place] today. He was referred by Dr. Katrinka [name] for peripheral vascular evaluation because of lifestyle limiting claudication. I last saw him in the office 10/13/16. He does have a history of treated hypertension and hyperlipidemia. 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 He is on Zetia. He has discomfort in both legs when he walks. Recent Doppler suggested high-frequency signals in both iliac arteries. In addition, he has moderate renal insufficiency. I performed angiography 09/26/16 after being admitted the day before for hydration. 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 I used a total 20 mL of contrast. His serum creatinine was in the mid-2 range. He was admitted on 11/02/16 for rehydration and underwent diamondback orbital rotation atherectomy, PTA and covered stenting using VBX covered stents of both calcified iliac arteries.He did well post procedure previous Dopplers were have not significantly changed nor have his symptoms.   Current Outpatient Prescriptions  Medication Sig Dispense Refill  . acetaminophen (TYLENOL) 325 MG tablet Take 2 tablets (650 mg total) by mouth every 4 (four) hours as needed for headache or mild pain.    Marland Kitchen aspirin EC 81 MG tablet Take 81 mg by mouth at bedtime.     . Bacitracin-Polymyxin B (POLYSPORIN EX) Apply 1 application topically 2 (two) times daily as needed (wound care).     . cloNIDine (CATAPRES) 0.1 MG tablet Take 1 tablet (0.1 mg total) by mouth 3 (three) times daily. (Patient taking differently: Take 0.1 mg  by mouth 2 (two) times daily. ) 100 tablet 11  . clopidogrel (PLAVIX) 75 MG tablet Take 1 tablet (75 mg total) by mouth daily with breakfast. 30 tablet 6  . Coenzyme Q10 (CO Q-10) 100 MG CAPS Take 100 mg by mouth daily.    Marland Kitchen ezetimibe (ZETIA) 10 MG tablet Take 5 mg by mouth daily.    . ferrous sulfate 325 (65 FE) MG tablet Take 325 mg by mouth at bedtime.     Marland Kitchen ibuprofen (ADVIL,MOTRIN) 200 MG tablet Take 400 mg by mouth 2 (two) times daily as needed for mild pain.    . isosorbide mononitrate (IMDUR) 30 MG 24 hr tablet Take 1 tablet (30 mg total) by mouth daily. 30 tablet 6  . metoprolol (LOPRESSOR) 100 MG tablet Take 0.5 tablets (50 mg total) by mouth 2 (two) times daily.    . nitroGLYCERIN (NITROSTAT) 0.4 MG SL tablet Place 0.4 mg under the tongue every 5 (five) minutes as needed for chest pain.    Marland Kitchen Oxymetazoline HCl (NASAL SPRAY NA) Place 1 spray into the nose 2 (two) times daily as needed (nasal congestion).    . polyvinyl alcohol (ARTIFICIAL TEARS) 1.4 % ophthalmic solution Place 1 drop into both eyes 2 (two) times daily as needed for dry eyes.     No current facility-administered medications for this visit.     Allergies  Allergen Reactions  . 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   . Other Swelling, Rash and Other (See Comments)    Pt states that he  is allergic to all -mycins.   Reaction:  Leg swelling   . Statins Other (See Comments)    Reaction:  Muscle pain   . Hydralazine Dermatitis  . Fish Oil Hives    Pt reports no reaction to fish (only fish oil capsules)  . Norvasc [Amlodipine] Swelling and Rash    Reaction:  Leg swelling     Social History   Social History  . Marital status: Married    Spouse name: N/A  . Number of children: N/A  . Years of education: N/A   Occupational History  . Not on file.   Social History Main Topics  . Smoking status: Former Smoker    Packs/day: 1.50    Years: 35.00    Types:  Cigarettes    Quit date: 01/08/1994  . Smokeless tobacco: Never Used  . Alcohol use 16.8 oz/week    28 Cans of beer per week     Comment: 3-5 beers everyday.  . Drug use: No  . Sexual activity: Not Currently   Other Topics Concern  . Not on file   Social History Narrative  . No narrative on file     Review of Systems: General: negative for chills, fever, night sweats or weight changes.  Cardiovascular: negative for chest pain, dyspnea on exertion, edema, orthopnea, palpitations, paroxysmal nocturnal dyspnea or shortness of breath Dermatological: negative for rash Respiratory: negative for cough or wheezing Urologic: negative for hematuria Abdominal: negative for nausea, vomiting, diarrhea, bright red blood per rectum, melena, or hematemesis Neurologic: negative for visual changes, syncope, or dizziness All other systems reviewed and are otherwise negative except as noted above.    Blood pressure (!) 152/72, pulse (!) 48, height 5\' 8"  (1.727 m), weight 173 lb (78.5 kg), SpO2 97 %.  General appearance: alert and no distress Neck: no adenopathy, no carotid bruit, no JVD, supple, symmetrical, trachea midline and thyroid not enlarged, symmetric, no tenderness/mass/nodules Lungs: clear to auscultation bilaterally Heart: regular rate and rhythm, S1, S2 normal, no murmur, click, rub or gallop Extremities: extremities normal, atraumatic, no cyanosis or edema  EKG Not performed Today  ASSESSMENT AND PLAN:   PAD (peripheral artery disease) (HCC) Mr. [name] returns today for his first post hospital outpatient follow-up after performing diamondback orbital or facial atherectomy, PTA and covered stenting using a VBX covered stent of both calcified iliac arteries for lifestyle limiting claudication. He had an excellent angiographic result. Because of his chronic renal insufficiency only 40 mL of contrast was used. His Dopplers revealed basically no change compared to his preintervention  results and he unfortunately cannot notice a significant clinical improvement. He has not gone down played golf however. We'll continue to monitor him noninvasively on an annual basis.      Edward Gess MD FACP,FACC,FAHA, [facility] 11/24/2016 11:50 AM

## 2016-11-24 NOTE — Assessment & Plan Note (Addendum)
[name] returns today for his first post hospital outpatient follow-up after performing diamondback orbital or facial atherectomy, PTA and covered stenting using a VBX covered stent of both calcified iliac arteries for lifestyle limiting claudication. He had an excellent angiographic result. Because of his chronic renal insufficiency only 40 mL of contrast was used. His Dopplers revealed basically no change compared to his preintervention results and he unfortunately cannot notice a significant clinical improvement. He has not gone down played golf however. We'll continue to monitor him noninvasively on an annual basis.

## 2016-11-24 NOTE — Patient Instructions (Signed)
Medication Instructions: Your physician recommends that you continue on your current medications as directed. Please refer to the Current Medication list given to you today.   Testing/Procedures: Your physician has requested that you have a aorta and iliac duplex. During this test, an ultrasound is used to evaluate blood flow to the aorta and iliac arteries. Allow one hour for this exam. Do not eat after midnight the day before and avoid carbonated beverages.   Your physician has requested that you have an ankle brachial index (ABI). During this test an ultrasound and blood pressure cuff are used to evaluate the arteries that supply the arms and legs with blood. Allow thirty minutes for this exam. There are no restrictions or special instructions.  Follow-Up: Your physician wants you to follow-up in: 1 year with Dr. Allyson Sabal after doppler. You will receive a reminder letter in the mail two months in advance. If you don't receive a letter, please call our office to schedule the follow-up appointment.  If you need a refill on your cardiac medications before your next appointment, please call your pharmacy.

## 2016-12-02 ENCOUNTER — Other Ambulatory Visit: Payer: Self-pay | Admitting: Cardiovascular Disease

## 2016-12-02 DIAGNOSIS — I739 Peripheral vascular disease, unspecified: Secondary | ICD-10-CM

## 2016-12-10 ENCOUNTER — Other Ambulatory Visit (HOSPITAL_COMMUNITY): Payer: Self-pay | Admitting: *Deleted

## 2016-12-13 ENCOUNTER — Ambulatory Visit (HOSPITAL_COMMUNITY)
Admission: RE | Admit: 2016-12-13 | Discharge: 2016-12-13 | Disposition: A | Discharge status: Home / Self Care | Payer: Medicare Other | Source: Ambulatory Visit | Attending: Nephrology | Admitting: Nephrology

## 2016-12-13 DIAGNOSIS — N189 Chronic kidney disease, unspecified: Secondary | ICD-10-CM | POA: Insufficient documentation

## 2016-12-13 DIAGNOSIS — D631 Anemia in chronic kidney disease: Secondary | ICD-10-CM | POA: Insufficient documentation

## 2016-12-13 MED ORDER — SODIUM CHLORIDE 0.9 % IV SOLN
510.0000 mg | INTRAVENOUS | Status: DC
  Administered 2016-12-13: 510 mg via INTRAVENOUS
  Filled 2016-12-13: qty 17

## 2016-12-13 NOTE — Discharge Instructions (Signed)

## 2016-12-20 ENCOUNTER — Encounter (HOSPITAL_COMMUNITY)
Admission: RE | Admit: 2016-12-20 | Discharge: 2016-12-20 | Disposition: A | Discharge status: Home / Self Care | Payer: Medicare Other | Source: Ambulatory Visit | Attending: Nephrology | Admitting: Nephrology

## 2016-12-20 DIAGNOSIS — D631 Anemia in chronic kidney disease: Secondary | ICD-10-CM | POA: Diagnosis present

## 2016-12-20 DIAGNOSIS — N189 Chronic kidney disease, unspecified: Secondary | ICD-10-CM | POA: Diagnosis present

## 2016-12-20 MED ORDER — SODIUM CHLORIDE 0.9 % IV SOLN
510.0000 mg | INTRAVENOUS | Status: DC
  Administered 2016-12-20: 510 mg via INTRAVENOUS
  Filled 2016-12-20: qty 17

## 2017-01-19 ENCOUNTER — Encounter: Payer: Self-pay | Admitting: Cardiology

## 2017-01-20 ENCOUNTER — Ambulatory Visit (INDEPENDENT_AMBULATORY_CARE_PROVIDER_SITE_OTHER): Payer: Medicare Other | Admitting: Cardiology

## 2017-01-20 ENCOUNTER — Encounter: Payer: Self-pay | Admitting: Cardiology

## 2017-01-20 VITALS — BP 136/82 | HR 58 | Ht 68.0 in | Wt 170.0 lb

## 2017-01-20 DIAGNOSIS — I25810 Atherosclerosis of coronary artery bypass graft(s) without angina pectoris: Secondary | ICD-10-CM

## 2017-01-20 DIAGNOSIS — I5032 Chronic diastolic (congestive) heart failure: Secondary | ICD-10-CM | POA: Diagnosis not present

## 2017-01-20 DIAGNOSIS — N183 Chronic kidney disease, stage 3 unspecified: Secondary | ICD-10-CM

## 2017-01-20 DIAGNOSIS — I10 Essential (primary) hypertension: Secondary | ICD-10-CM | POA: Diagnosis not present

## 2017-01-20 DIAGNOSIS — E1159 Type 2 diabetes mellitus with other circulatory complications: Secondary | ICD-10-CM

## 2017-01-20 DIAGNOSIS — I739 Peripheral vascular disease, unspecified: Secondary | ICD-10-CM | POA: Diagnosis not present

## 2017-01-20 MED ORDER — DOCUSATE SODIUM 50 MG PO CAPS: 50.0000 mg | ORAL_CAPSULE | Freq: Two times a day (BID) | ORAL | 11 refills | Status: AC

## 2017-01-20 MED ORDER — FUROSEMIDE 40 MG PO TABS: 40.0000 mg | ORAL_TABLET | Freq: Every day | ORAL | 3 refills | Status: DC

## 2017-01-20 NOTE — Progress Notes (Signed)
Cardiology Office Note   Date:  01/20/2017   ID:  [name], [name] 1941-05-19, MRN [id number]  PCP:  Lupita Raider, MD  Cardiologist:  Dr. Katrinka Blazing PV Dr. Erlene Quan     Chief Complaint  Patient presents with  . Coronary Artery Disease      History of Present Illness: [name] is a 76 y.o. male who presents for CAD.    He has had a myocardial infarction in 1995 and bypass surgery and 2005.Marland Kitchen He was diagnosed with statin myopathy and subsequently these were discontinued. He is on Zetia. He had discomfort in both legs when he walked.  Doppler suggested high-frequency signals in both iliac arteries. In addition, he has moderate renal insufficiency. Dr. Erlene Quan performed angiography 09/26/16 after being admitted the day before for hydration. 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 A total 20 mL of contrast was used. His serum creatinine was in the mid-2 range. He was admitted on 11/02/16 for rehydration and underwent diamondback orbital rotation atherectomy, PTA and covered stenting using VBX covered stents of both calcified iliac arteries.He did well post procedure previous Dopplers were have not significantly changed nor have his symptoms.   Dr. Hyman Hopes has followed his kidneys and recently saw pt and labs done, pt has also rec'd 2 doses of IV Iron.    Pt complains of increased bruising, increased eczema and constipation.  Most likely from clonidine and pt has decreased it from TID to BID.  He has lower ext edema, legs very tight which he blames on the BB.  He does not use much salt.  He has no SOB.  No chest pain.  He does not know how his kidneys are doing.      Past Medical History:  Diagnosis Date  . Alcohol abuse   . Arthritis    "just about qwhere now" (08/28/2015)  . Benign prostatic hyperplasia   . CAD (coronary artery disease) 2005   mLAD ~90%, D1/RI-70, mRCA 70% --> CABG 3: LIMA-LAD, SVG-D1/RI, SVG-dRCA  . HTN (hypertension)     . Hyperlipemia   . Iron deficiency anemia   . Myocardial infarction [facility]) 1995   s/p PTCA  . Obesity   . Peripheral vascular disease (HCC)   . PONV (postoperative nausea and vomiting)   . Type II diabetes mellitus (HCC)     Past Surgical History:  Procedure Laterality Date  . ABDOMINAL HERNIA REPAIR    . CARDIAC CATHETERIZATION  2005   mLAD ~90%, D1/RI-70, mRCA 70%   . CARDIAC CATHETERIZATION N/A 08/29/2015   Procedure: Left Heart Cath and Coronary Angiography;  Surgeon: Marykay Lex, MD;  Location: [facility] INVASIVE CV LAB;  Service: Cardiovascular;  Laterality: N/A;  . CAROTID ENDARTERECTOMY    . CORONARY ANGIOPLASTY WITH STENT PLACEMENT  12/1993; 04/1994  . CORONARY ARTERY BYPASS GRAFT  2005   CABG 3: LIMA-LAD, SVG-D1/RI, SVG-dRCA (Dr.VanTrigt)  . EYE SURGERY Left 1968   "dug hot steel out that was imbedded"   . HERNIA REPAIR    . INGUINAL HERNIA REPAIR Right   . KNEE ARTHROSCOPY Left   . KNEE CARTILAGE SURGERY Right 1960s   "took cartilage out"  . LOWER EXTREMITY ANGIOGRAPHY N/A 09/27/2016   Procedure: Lower Extremity Angiography;  Surgeon: Runell Gess, MD;  Location[facility] INVASIVE CV LAB;  Service: Cardiovascular;  Laterality: N/A;  . LOWER EXTREMITY INTERVENTION N/A 11/04/2016   Procedure: Lower Extremity Intervention;  Surgeon: Runell Gess,  MD;  Location: [facility] INVASIVE CV LAB;  Service: Cardiovascular;  Laterality: N/A;  . SHOULDER ARTHROSCOPY W/ ROTATOR CUFF REPAIR Left   . SKIN CANCER DESTRUCTION     "6 cut off" (08/28/2015)  . UMBILICAL HERNIA REPAIR       Current Outpatient Prescriptions  Medication Sig Dispense Refill  . acetaminophen (TYLENOL) 325 MG tablet Take 2 tablets (650 mg total) by mouth every 4 (four) hours as needed for headache or mild pain.    Marland Kitchen aspirin EC 81 MG tablet Take 81 mg by mouth at bedtime.     . Bacitracin-Polymyxin B (POLYSPORIN EX) Apply 1 application topically 2 (two) times daily as needed (wound care).     . cloNIDine (CATAPRES) 0.1  MG tablet Take 1 tablet (0.1 mg total) by mouth 3 (three) times daily. (Patient taking differently: Take 0.1 mg by mouth 2 (two) times daily. ) 100 tablet 11  . clopidogrel (PLAVIX) 75 MG tablet Take 1 tablet (75 mg total) by mouth daily with breakfast. 30 tablet 6  . Coenzyme Q10 (CO Q-10) 100 MG CAPS Take 100 mg by mouth daily.    Marland Kitchen ezetimibe (ZETIA) 10 MG tablet Take 5 mg by mouth daily.    . ferrous sulfate 325 (65 FE) MG tablet Take 325 mg by mouth at bedtime.     . isosorbide mononitrate (IMDUR) 30 MG 24 hr tablet Take 1 tablet (30 mg total) by mouth daily. 30 tablet 6  . metoprolol (LOPRESSOR) 100 MG tablet Take 0.5 tablets (50 mg total) by mouth 2 (two) times daily.    . nitroGLYCERIN (NITROSTAT) 0.4 MG SL tablet Place 0.4 mg under the tongue every 5 (five) minutes as needed for chest pain.    Marland Kitchen Oxymetazoline HCl (NASAL SPRAY NA) Place 1 spray into the nose 2 (two) times daily as needed (nasal congestion).    . polyvinyl alcohol (ARTIFICIAL TEARS) 1.4 % ophthalmic solution Place 1 drop into both eyes 2 (two) times daily as needed for dry eyes.    Marland Kitchen docusate sodium (COLACE) 50 MG capsule Take 1 capsule (50 mg total) by mouth 2 (two) times daily. 60 capsule 11  . furosemide (LASIX) 40 MG tablet Take 1 tablet (40 mg total) by mouth daily. 90 tablet 3  . ibuprofen (ADVIL,MOTRIN) 200 MG tablet Take 400 mg by mouth 2 (two) times daily as needed for mild pain.     No current facility-administered medications for this visit.     Allergies:   Food; Other; Statins; Hydralazine; Fish oil; and Norvasc [amlodipine]    Social History:  The patient  reports that he quit smoking about 23 years ago. His smoking use included Cigarettes. He has a 52.50 pack-year smoking history. He has never used smokeless tobacco. He reports that he drinks about 16.8 oz of alcohol per week . He reports that he does not use drugs.   Family History:  The patient's family history includes CAD in his father; Heart attack in  his mother.    ROS:  General:no colds or fevers, + weight loss Skin:no rashes or ulcers HEENT:no blurred vision, no congestion CV:see HPI PUL:see HPI GI:no diarrhea ++constipation no melena, no indigestion GU:no hematuria, no dysuria MS:no joint pain, no claudication Neuro:no syncope, no lightheadedness Endo:no diabetes, no thyroid disease  Wt Readings from Last 3 Encounters:  01/20/17 170 lb (77.1 kg)  12/20/16 171 lb (77.6 kg)  12/13/16 175 lb (79.4 kg)     PHYSICAL EXAM: VS:  BP 136/82  Pulse (!) 58   Ht 5\' 8"  (1.727 m)   Wt 170 lb (77.1 kg)   SpO2 98%   BMI 25.85 kg/m  , BMI Body mass index is 25.85 kg/m. General:Pleasant affect, NAD Skin:Warm and dry, brisk capillary refill HEENT:normocephalic, sclera clear, mucus membranes moist Neck:supple, no JVD, no bruits  Heart:S1S2 RRR without murmur, gallup, rub or click Lungs:clear without rales, rhonchi, or wheezes XNA:TFTD, non tender, + BS, do not palpate liver spleen or masses Ext:2+ lower ext edema tight legs, 2+ pedal pulses, 2+ radial pulses Neuro:alert and oriented X 3, MAE, follows commands, + facial symmetry    EKG:  EKG is NOT ordered today.     Recent Labs: 03/26/2016: ALT 19; B Natriuretic Peptide 1,405.5 11/01/2016: TSH 3.84 11/05/2016: BUN 20; Creatinine, Ser 2.64; Hemoglobin 8.7; Platelets 245; Potassium 3.8; Sodium 132    Lipid Panel No results found for: CHOL, TRIG, HDL, CHOLHDL, VLDL, LDLCALC, LDLDIRECT     Other studies Reviewed: Additional studies/ records that were reviewed today include: . Coronary Angiography 2017: Diagnostic Diagram      Conclusion    Ost 1st Diag to 1st Diag lesion, 100% stenosed. SVG-DIAG is normal in caliber, and is anatomically normal.  Ost LAD to Prox LAD lesion, 80% stenosed. Prox LAD lesion, 100% stenosed. LIMA-LAD is normal in caliber, and is anatomically normal.  Prox RCA to Mid RCA lesion, 80% stenosed. SVG-dRCA is large and competitive flow  minimizes the antegrade flow from the native coronary.  Ost RPDA lesion, 60% stenosed.  The left ventricular systolic function is normal.       Echocardiogram, 08/28/15: Study Conclusions  - Left ventricle: The cavity size was normal. Wall thickness was increased in a pattern of mild LVH. Systolic function was vigorous. The estimated ejection fraction was in the range of 65% to 70%. Wall motion was normal; there were no regional wall motion abnormalities. Doppler parameters are consistent with abnormal left ventricular relaxation (grade 1 diastolic dysfunction). The E/e&' ratio is between 8-15, suggesting indeterminate LV filling pressure. - Mitral valve: Mildly thickened leaflets . There was trivial regurgitation. - Left atrium: The atrium was mildly dilated. - Right ventricle: The cavity size was mildly dilated. Systolic function is reduced. - Right atrium: Severely dilated at 28 cm2. - Tricuspid valve: There was moderate regurgitation. - Pulmonic valve: The valve appears to be grossly normal. There was mild regurgitation. - Pulmonary arteries: PA peak pressure: 41 mm Hg (S). - Inferior vena cava: The vessel was dilated. The respirophasic diameter changes were blunted (<50%), consistent with elevated central venous pressure.  Impressions:  - LVEF 65-70%, mild LVH, normal wall motion, diastolic dysfunction, indeterminate LV filling pressure, mild LAE, dilated RV with reduced systolic function, severe RAE, moderate TR, mild PI, RVSP 41 mmHg, dilated IVC.    ASSESSMENT AND PLAN:  1.  CAD with CABG no chest pain  2.  Chronic diastolic HF, now with lower ext edema.  Will add lasix 40 mg daily.  And get labs from Dr. Hyman Hopes.  Will recheck BMP on Monday before he goes out of town.  Follow up in 4 weeks.   3.  HTN stable today  4.  DM-2 per PCP  5.  PAD followed by Dr. Allyson Sabal still with leg pain chronic  6. Constipation use colace 100 mg  BID.  miralax prn  May be due to clonidine but BP meds are difficult he has many allergies, intolerance.     Current medicines are reviewed with the  patient today.  The patient Has no concerns regarding medicines.  The following changes have been made:  See above Labs/ tests ordered today include:see above  Disposition:   FU:  see above  Signed, [name], NP  01/20/2017 4:32 PM    [facility] [address] [place], [place], [place]  [address]/ [address] [place], [place] Phone: [phone]; Fax: [phone]  [phone]

## 2017-01-20 NOTE — Patient Instructions (Addendum)
Medication Instructions:  1) START LASIX 40 mg daily 2) START COLACE 100 mg TWICE DAILY  Labwork: Your provider recommends that you return for lab work on Monday.  Testing/Procedures: None  Follow-Up: You have an appointment with Edward Boozer, NP on 03/08/17 at 1:30PM.  Any Other Special Instructions Will Be Listed Below (If Applicable).     If you need a refill on your cardiac medications before your next appointment, please call your pharmacy.

## 2017-01-24 ENCOUNTER — Other Ambulatory Visit: Payer: Medicare Other | Admitting: *Deleted

## 2017-01-24 DIAGNOSIS — I10 Essential (primary) hypertension: Secondary | ICD-10-CM

## 2017-01-24 LAB — BASIC METABOLIC PANEL
BUN / CREAT RATIO: 9 — AB (ref 10–24)
BUN: 29 mg/dL — AB (ref 8–27)
CALCIUM: 8.2 mg/dL — AB (ref 8.6–10.2)
CHLORIDE: 104 mmol/L (ref 96–106)
CO2: 20 mmol/L (ref 20–29)
Creatinine, Ser: 3.07 mg/dL — ABNORMAL HIGH (ref 0.76–1.27)
GFR calc Af Amer: 22 mL/min/{1.73_m2} — ABNORMAL LOW (ref >59)
GFR calc non Af Amer: 19 mL/min/{1.73_m2} — ABNORMAL LOW (ref >59)
GLUCOSE: 99 mg/dL (ref 65–99)
POTASSIUM: 5.1 mmol/L (ref 3.5–5.2)
Sodium: 137 mmol/L (ref 134–144)

## 2017-01-25 ENCOUNTER — Telehealth: Payer: Self-pay | Admitting: *Deleted

## 2017-01-25 NOTE — Telephone Encounter (Signed)
-----   Message from Leone Brand, NP sent at 01/24/2017  4:42 PM EDT ----- Kidney function is more stressed. Hold off on lasix for now - I have sent copy to Dr. Hyman Hopes and Dr. Katrinka Blazing.  If edema increases call the office, decrease salt. Sodium is normal.

## 2017-03-08 ENCOUNTER — Ambulatory Visit: Payer: Medicare Other | Admitting: Cardiology

## 2017-04-01 ENCOUNTER — Other Ambulatory Visit: Payer: Self-pay | Admitting: Physician Assistant

## 2017-04-26 NOTE — Progress Notes (Signed)
Cardiology Office Note    Date:  04/27/2017   ID:  [name], DOB 04/16/1941, MRN [id number]  PCP:  Lupita RaiderShaw, Kimberlee, MD  Cardiologist: Lesleigh NoeHenry W Brigida Scotti III, MD   Chief Complaint  Patient presents with  . Coronary Artery Disease  . Acute Renal Failure    History of Present Illness:  [name] is a 76 y.o. male male  CAD, prior coronary bypass grafting, significant native vessel disease, diastolic dysfunction, and chronic kidney disease stage IV.  In 2018 the patient underwent extremity angiography with stenting of both iliac arteries but no improvement in claudication.  (Dr. Nanetta BattyJonathan Berry).  He had a full body rash earlier this year and stopped taking all of his medications.  He feels great.  Feels that since he stopped all of his medications he is doing much better.  Rash is completely resolved.  He denies chest pain, shortness of breath, palpitations, weakness, and edema.  There is no orthopnea he has not had syncope.   Past Medical History:  Diagnosis Date  . Alcohol abuse   . Arthritis    "just about qwhere now" (08/28/2015)  . Benign prostatic hyperplasia   . CAD (coronary artery disease) 2005   mLAD ~90%, D1/RI-70, mRCA 70% --> CABG 3: LIMA-LAD, SVG-D1/RI, SVG-dRCA  . HTN (hypertension)   . Hyperlipemia   . Iron deficiency anemia   . Myocardial infarction [facility](HCC) 1995   s/p PTCA  . Obesity   . Peripheral vascular disease (HCC)   . PONV (postoperative nausea and vomiting)   . Type II diabetes mellitus (HCC)     Past Surgical History:  Procedure Laterality Date  . ABDOMINAL HERNIA REPAIR    . CARDIAC CATHETERIZATION  2005   mLAD ~90%, D1/RI-70, mRCA 70%   . CARDIAC CATHETERIZATION N/A 08/29/2015   Procedure: Left Heart Cath and Coronary Angiography;  Surgeon: Marykay Lexavid W Harding, MD;  [facility];  Service: Cardiovascular;  Laterality: N/A;  . CAROTID ENDARTERECTOMY    . CORONARY ANGIOPLASTY WITH STENT PLACEMENT  12/1993; 04/1994  . CORONARY  ARTERY BYPASS GRAFT  2005   CABG 3: LIMA-LAD, SVG-D1/RI, SVG-dRCA (Dr.VanTrigt)  . EYE SURGERY Left 1968   "dug hot steel out that was imbedded"   . HERNIA REPAIR    . INGUINAL HERNIA REPAIR Right   . KNEE ARTHROSCOPY Left   . KNEE CARTILAGE SURGERY Right 1960s   "took cartilage out"  . LOWER EXTREMITY ANGIOGRAPHY N/A 09/27/2016   Procedure: Lower Extremity Angiography;  Surgeon: Runell GessJonathan J Berry, MD;  [facility];  Service: Cardiovascular;  Laterality: N/A;  . LOWER EXTREMITY INTERVENTION N/A 11/04/2016   Procedure: Lower Extremity Intervention;  Surgeon: Runell GessBerry, Jonathan J, MD;  [facility];  Service: Cardiovascular;  Laterality: N/A;  . SHOULDER ARTHROSCOPY W/ ROTATOR CUFF REPAIR Left   . SKIN CANCER DESTRUCTION     "6 cut off" (08/28/2015)  . UMBILICAL HERNIA REPAIR      Current Medications: Outpatient Medications Prior to Visit  Medication Sig Dispense Refill  . acetaminophen (TYLENOL) 325 MG tablet Take 2 tablets (650 mg total) by mouth every 4 (four) hours as needed for headache or mild pain.    Marland Kitchen. aspirin EC 81 MG tablet Take 81 mg by mouth at bedtime.     . Bacitracin-Polymyxin B (POLYSPORIN EX) Apply 1 application topically 2 (two) times daily as needed (wound care).     . Coenzyme Q10 (CO Q-10) 100 MG  CAPS Take 100 mg by mouth daily.    Marland Kitchen docusate sodium (COLACE) 50 MG capsule Take 1 capsule (50 mg total) by mouth 2 (two) times daily. 60 capsule 11  . ezetimibe (ZETIA) 10 MG tablet Take 5 mg by mouth daily.    . ferrous sulfate 325 (65 FE) MG tablet Take 325 mg by mouth at bedtime.     Marland Kitchen ibuprofen (ADVIL,MOTRIN) 200 MG tablet Take 400 mg by mouth 2 (two) times daily as needed for mild pain.    . nitroGLYCERIN (NITROSTAT) 0.4 MG SL tablet Place 0.4 mg under the tongue every 5 (five) minutes as needed for chest pain.    Marland Kitchen Oxymetazoline HCl (NASAL SPRAY NA) Place 1 spray into the nose 2 (two) times daily as needed (nasal congestion).    . polyvinyl  alcohol (ARTIFICIAL TEARS) 1.4 % ophthalmic solution Place 1 drop into both eyes 2 (two) times daily as needed for dry eyes.    . cloNIDine (CATAPRES) 0.1 MG tablet Take 1 tablet (0.1 mg total) by mouth 3 (three) times daily. (Patient taking differently: Take 0.1 mg by mouth 2 (two) times daily. ) 100 tablet 11  . clopidogrel (PLAVIX) 75 MG tablet TAKE 1 TABLET BY MOUTH EVERY DAY WITH BREAKFAST 30 tablet 9  . isosorbide mononitrate (IMDUR) 30 MG 24 hr tablet TAKE 1 TABLET BY MOUTH EVERY DAY 30 tablet 9  . metoprolol (LOPRESSOR) 100 MG tablet Take 0.5 tablets (50 mg total) by mouth 2 (two) times daily.     No facility-administered medications prior to visit.      Allergies:   Food; Other; Statins; Hydralazine; Fish oil; and Norvasc [amlodipine]   Social History   Socioeconomic History  . Marital status: Married    Spouse name: None  . Number of children: None  . Years of education: None  . Highest education level: None  Social Needs  . Financial resource strain: None  . Food insecurity - worry: None  . Food insecurity - inability: None  . Transportation needs - medical: None  . Transportation needs - non-medical: None  Occupational History  . None  Tobacco Use  . Smoking status: Former Smoker    Packs/day: 1.50    Years: 35.00    Pack years: 52.50    Types: Cigarettes    Last attempt to quit: 01/08/1994    Years since quitting: 23.3  . Smokeless tobacco: Never Used  Substance and Sexual Activity  . Alcohol use: Yes    Alcohol/week: 16.8 oz    Types: 28 Cans of beer per week    Comment: 3-5 beers everyday.  . Drug use: No  . Sexual activity: Not Currently  Other Topics Concern  . None  Social History Narrative  . None     Family History:  The patient's family history includes CAD in his father; Heart attack in his mother.   ROS:   Please see the history of present illness.    A full body rash has resolved stopping all of his medications.  Continues to have leg pain  despite iliac intervention by Dr. Allyson Sabal All other systems reviewed and are negative.   PHYSICAL EXAM:   VS:  BP (!) 184/70   Pulse (!) 37   Ht 5\' 8"  (1.727 m)   Wt 166 lb 6.4 oz (75.5 kg)   SpO2 99%   BMI 25.30 kg/m    GEN: Well nourished, well developed, in no acute distress.  Heart rate at rest around  110 increasing to 150 with minimal activity office.  Oximetry O2 saturation 93%. HEENT: normal  Neck: no JVD, carotid bruits, or masses Cardiac: IIRR; no murmurs, rubs, or gallops,no edema  Respiratory:  clear to auscultation bilaterally, normal work of breathing GI: soft, nontender, nondistended, + BS MS: no deformity or atrophy  Skin: warm and dry, no rash Neuro:  Alert and Oriented x 3, Strength and sensation are intact Psych: euthymic mood, full affect  Wt Readings from Last 3 Encounters:  04/27/17 166 lb 6.4 oz (75.5 kg)  01/20/17 170 lb (77.1 kg)  12/20/16 171 lb (77.6 kg)      Studies/Labs Reviewed:   EKG:  EKG atrial fibrillation with rapid ventricular response at 112 bpm.  Nonspecific ST-T wave changes noted.  Right axis deviation is noted.  Recent Labs: 11/01/2016: TSH 3.84 11/05/2016: Hemoglobin 8.7; Platelets 245 01/24/2017: BUN 29; Creatinine, Ser 3.07; Potassium 5.1; Sodium 137   Lipid Panel No results found for: CHOL, TRIG, HDL, CHOLHDL, VLDL, LDLCALC, LDLDIRECT  Additional studies/ records that were reviewed today include:  Laboratory data completed 04/25/17 by Dr. Lupita Raider revealed creatinine 4.9 potassium 4.4 hemoglobin A1c 5.8 total cholesterol 184 LDL cholesterol 85    ASSESSMENT:    1. Atrial fibrillation, unspecified type (HCC)   2. CKD (chronic kidney disease), stage IV (HCC)   3. Coronary artery disease involving coronary bypass graft of native heart with angina pectoris (HCC)   4. Essential hypertension   5. PVD (peripheral vascular disease) with claudication (HCC)   6. Type 2 diabetes mellitus with vascular disease (HCC)      PLAN:    In order of problems listed above:  1. Atrial fibrillation with rapid ventricular response is new.  With the current heart rate response, unknown duration, but overall clinical stability we will resume metoprolol 25 mg twice daily, start amiodarone 200 mg twice daily, and Coumadin 5 mg daily for 3 days before returning to the Coumadin clinic.  He will need to be seen again in 2-3 weeks to reassess atrial fibrillation rate and consider being set up for electrical cardioversion.  We discussed atrial fibrillation, high stroke risk given his comorbidities (CHADS VASC >5).  With reluctance he is agreed to start occasions as listed above.  I also note the severe elevation in creatinine which could be related to decreased renal perfusion on top of underlying significant disease.  Creatinine has jumped up from 2.8 to 4.9 all labs performed by Dr. Clelia Croft 2 days ago. 2. Dramatic worsening in kidney function with current stage IV classification.  Recent worsening possibly related to decreased renal perfusion from atrial fibrillation of unknown duration. 3. Despite a rapid heart rate he is not having angina or heart failure symptoms. 4. Blood pressure is elevated.  Metoprolol 25 mg twice daily 5. Not addressed 6. Not addressed  Clinical follow-up in 3 weeks.  Call if shortness of breath, lightheadedness, dyspnea, or swelling.  Notify Dr. Daphine Deutscher Webb's office concerning    Medication Adjustments/Labs and Tests Ordered: Current medicines are reviewed at length with the patient today.  Concerns regarding medicines are outlined above.  Medication changes, Labs and Tests ordered today are listed in the Patient Instructions below. Patient Instructions  Medication Instructions:  1) START Amiodarone 200mg  twice daily 2) START Coumadin 5mg  once daily on Friday. 3) START Metoprolol Tartrate 25mg  twice daily  Labwork: None  Testing/Procedures: Non  Follow-Up: Your physician recommends that you schedule a  follow-up appointment on Monday with our Coumadin Clinic. (  new patient)  Your physician recommends that you schedule a follow-up appointment in: 1 month with Dr. Katrinka Blazing. (Can have 2pm on 05/18/17)    Any Other Special Instructions Will Be Listed Below (If Applicable).  Call your Nephrologist as soon as possible in regards to your recent lab work from your Primary Care Physician.    If you need a refill on your cardiac medications before your next appointment, please call your pharmacy.      Signed, Lesleigh Noe, MD  04/27/2017 9:12 AM    [facility] [address] [place], [place], [place]  [address] Phone: [phone]; Fax: [phone]

## 2017-04-27 ENCOUNTER — Ambulatory Visit (INDEPENDENT_AMBULATORY_CARE_PROVIDER_SITE_OTHER): Payer: Medicare Other | Admitting: Interventional Cardiology

## 2017-04-27 ENCOUNTER — Encounter (INDEPENDENT_AMBULATORY_CARE_PROVIDER_SITE_OTHER): Payer: Self-pay

## 2017-04-27 ENCOUNTER — Encounter: Payer: Self-pay | Admitting: Interventional Cardiology

## 2017-04-27 VITALS — BP 184/70 | HR 37 | Ht 68.0 in | Wt 166.4 lb

## 2017-04-27 DIAGNOSIS — I25810 Atherosclerosis of coronary artery bypass graft(s) without angina pectoris: Secondary | ICD-10-CM | POA: Diagnosis not present

## 2017-04-27 DIAGNOSIS — I25709 Atherosclerosis of coronary artery bypass graft(s), unspecified, with unspecified angina pectoris: Secondary | ICD-10-CM | POA: Diagnosis not present

## 2017-04-27 DIAGNOSIS — I739 Peripheral vascular disease, unspecified: Secondary | ICD-10-CM

## 2017-04-27 DIAGNOSIS — E1159 Type 2 diabetes mellitus with other circulatory complications: Secondary | ICD-10-CM | POA: Diagnosis not present

## 2017-04-27 DIAGNOSIS — I10 Essential (primary) hypertension: Secondary | ICD-10-CM | POA: Diagnosis not present

## 2017-04-27 DIAGNOSIS — N184 Chronic kidney disease, stage 4 (severe): Secondary | ICD-10-CM

## 2017-04-27 DIAGNOSIS — I4891 Unspecified atrial fibrillation: Secondary | ICD-10-CM | POA: Diagnosis not present

## 2017-04-27 DIAGNOSIS — I209 Angina pectoris, unspecified: Secondary | ICD-10-CM | POA: Diagnosis not present

## 2017-04-27 MED ORDER — AMIODARONE HCL 200 MG PO TABS: 200.0000 mg | ORAL_TABLET | Freq: Two times a day (BID) | ORAL | 3 refills | Status: DC

## 2017-04-27 MED ORDER — METOPROLOL TARTRATE 25 MG PO TABS: 25.0000 mg | ORAL_TABLET | Freq: Two times a day (BID) | ORAL | 3 refills | Status: DC

## 2017-04-27 MED ORDER — WARFARIN SODIUM 5 MG PO TABS
5.0000 mg | ORAL_TABLET | Freq: Every day | ORAL | 1 refills | Status: DC
Start: 2017-04-27 — End: 2017-05-02

## 2017-04-27 NOTE — Patient Instructions (Addendum)
Medication Instructions:  1) START Amiodarone 200mg  twice daily 2) START Coumadin 5mg  once daily on Friday. 3) START Metoprolol Tartrate 25mg  twice daily  Labwork: None  Testing/Procedures: Non  Follow-Up: Your physician recommends that you schedule a follow-up appointment on Monday with our Coumadin Clinic. (new patient)  Your physician recommends that you schedule a follow-up appointment in: 1 month with Dr. Katrinka Blazing. (Can have 2pm on 05/18/17)    Any Other Special Instructions Will Be Listed Below (If Applicable).  Call your Nephrologist as soon as possible in regards to your recent lab work from your Primary Care Physician.    If you need a refill on your cardiac medications before your next appointment, please call your pharmacy.

## 2017-04-29 ENCOUNTER — Other Ambulatory Visit: Payer: Self-pay | Admitting: Nephrology

## 2017-04-29 DIAGNOSIS — N183 Chronic kidney disease, stage 3 unspecified: Secondary | ICD-10-CM

## 2017-05-02 ENCOUNTER — Ambulatory Visit (INDEPENDENT_AMBULATORY_CARE_PROVIDER_SITE_OTHER): Payer: Medicare Other | Admitting: *Deleted

## 2017-05-02 DIAGNOSIS — Z5181 Encounter for therapeutic drug level monitoring: Secondary | ICD-10-CM | POA: Diagnosis not present

## 2017-05-02 DIAGNOSIS — I4891 Unspecified atrial fibrillation: Secondary | ICD-10-CM

## 2017-05-02 DIAGNOSIS — N183 Chronic kidney disease, stage 3 unspecified: Secondary | ICD-10-CM

## 2017-05-02 DIAGNOSIS — Z951 Presence of aortocoronary bypass graft: Secondary | ICD-10-CM

## 2017-05-02 DIAGNOSIS — Z7189 Other specified counseling: Secondary | ICD-10-CM | POA: Insufficient documentation

## 2017-05-02 LAB — POCT INR: INR: 2.3

## 2017-05-02 MED ORDER — WARFARIN SODIUM 2.5 MG PO TABS: ORAL_TABLET | ORAL | 0 refills | Status: DC

## 2017-05-02 NOTE — Patient Instructions (Addendum)
Please pick up 2.5mg  (green color) Coumadin tablet from pharmacy & Start taking 1/2 tablet (1.25mg ) daily except 1 tablet (2.5mg ) on Mondays.  Coumadin Clinic#[phone] Main [phone]  A full discussion of the nature of anticoagulants has been carried out.  A benefit risk analysis has been presented to the patient, so that they understand the justification for choosing anticoagulation at this time. The need for frequent and regular monitoring, precise dosage adjustment and compliance is stressed.  Side effects of potential bleeding are discussed.  The patient should avoid any OTC items containing aspirin or ibuprofen, and should avoid great swings in general diet.  Avoid alcohol consumption.  Call if any signs of abnormal bleeding.

## 2017-05-11 ENCOUNTER — Ambulatory Visit
Admission: RE | Admit: 2017-05-11 | Discharge: 2017-05-11 | Disposition: A | Discharge status: Home / Self Care | Payer: Medicare Other | Source: Ambulatory Visit | Attending: Nephrology | Admitting: Nephrology

## 2017-05-17 NOTE — Progress Notes (Signed)
Cardiology Office Note    Date:  05/18/2017   ID:  [name] 1940-12-11, MRN [id number]  PCP:  Edward Raider, MD  Cardiologist: Edward Noe, MD   Chief Complaint  Patient presents with  . Coronary Artery Disease    History of Present Illness:  [name] is a 76 y.o. male with CAD, prior coronary bypass grafting, significant native vessel disease, diastolic dysfunction, and chronic kidney disease stage IV.  In 2018 the patient underwent extremity angiography with stenting of both iliac arteries but no improvement in claudication.  (Dr. Nanetta Batty).  He had a full body rash earlier this year and stopped taking all of his medications. Discovered to have asymptomatic AF on last OV.  On the last office visit atrial fibrillation was identified, anticoagulation with Coumadin was started.  He is now being followed in the Coumadin clinic.  Metoprolol 25 mg twice daily was started along with 200 mg twice daily of amiodarone.  He is back today for reevaluation.  In the 2-week timeframe he has noticed lower extremity swelling.  States his breathing is doing okay.  He is reluctant to start any antihypertensive therapy because of reactions that he has had in the past.  He denies chest pain.   Past Medical History:  Diagnosis Date  . Alcohol abuse   . Arthritis    "just about qwhere now" (08/28/2015)  . Benign prostatic hyperplasia   . CAD (coronary artery disease) 2005   mLAD ~90%, D1/RI-70, mRCA 70% --> CABG 3: LIMA-LAD, SVG-D1/RI, SVG-dRCA  . HTN (hypertension)   . Hyperlipemia   . Iron deficiency anemia   . Myocardial infarction [facility]) 1995   s/p PTCA  . Obesity   . Peripheral vascular disease (HCC)   . PONV (postoperative nausea and vomiting)   . Type II diabetes mellitus (HCC)     Past Surgical History:  Procedure Laterality Date  . ABDOMINAL HERNIA REPAIR    . CARDIAC CATHETERIZATION  2005   mLAD ~90%, D1/RI-70, mRCA 70%   . CARDIAC CATHETERIZATION  N/A 08/29/2015   Procedure: Left Heart Cath and Coronary Angiography;  Surgeon: Marykay Lex, MD;  Location: [facility] INVASIVE CV LAB;  Service: Cardiovascular;  Laterality: N/A;  . CAROTID ENDARTERECTOMY    . CORONARY ANGIOPLASTY WITH STENT PLACEMENT  12/1993; 04/1994  . CORONARY ARTERY BYPASS GRAFT  2005   CABG 3: LIMA-LAD, SVG-D1/RI, SVG-dRCA (Dr.VanTrigt)  . EYE SURGERY Left 1968   "dug hot steel out that was imbedded"   . HERNIA REPAIR    . INGUINAL HERNIA REPAIR Right   . KNEE ARTHROSCOPY Left   . KNEE CARTILAGE SURGERY Right 1960s   "took cartilage out"  . LOWER EXTREMITY ANGIOGRAPHY N/A 09/27/2016   Procedure: Lower Extremity Angiography;  Surgeon: Runell Gess, MD;  Location: [facility] INVASIVE CV LAB;  Service: Cardiovascular;  Laterality: N/A;  . LOWER EXTREMITY INTERVENTION N/A 11/04/2016   Procedure: Lower Extremity Intervention;  Surgeon: Runell Gess, MD;  Location: [facility] INVASIVE CV LAB;  Service: Cardiovascular;  Laterality: N/A;  . SHOULDER ARTHROSCOPY W/ ROTATOR CUFF REPAIR Left   . SKIN CANCER DESTRUCTION     "6 cut off" (08/28/2015)  . UMBILICAL HERNIA REPAIR      Current Medications: Outpatient Medications Prior to Visit  Medication Sig Dispense Refill  . acetaminophen (TYLENOL) 325 MG tablet Take 2 tablets (650 mg total) by mouth every 4 (four) hours as needed for headache or mild pain.    Marland Kitchen  aspirin EC 81 MG tablet Take 81 mg by mouth at bedtime.     . Bacitracin-Polymyxin B (POLYSPORIN EX) Apply 1 application topically 2 (two) times daily as needed (wound care).     . Coenzyme Q10 (CO Q-10) 100 MG CAPS Take 100 mg by mouth daily.    Marland Kitchen docusate sodium (COLACE) 50 MG capsule Take 1 capsule (50 mg total) by mouth 2 (two) times daily. 60 capsule 11  . ezetimibe (ZETIA) 10 MG tablet Take 5 mg by mouth daily.    . ferrous sulfate 325 (65 FE) MG tablet Take 325 mg by mouth at bedtime.     Marland Kitchen ibuprofen (ADVIL,MOTRIN) 200 MG tablet Take 400 mg by mouth 2 (two) times daily as  needed for mild pain.    . nitroGLYCERIN (NITROSTAT) 0.4 MG SL tablet Place 0.4 mg under the tongue every 5 (five) minutes as needed for chest pain.    Marland Kitchen Oxymetazoline HCl (NASAL SPRAY NA) Place 1 spray into the nose 2 (two) times daily as needed (nasal congestion).    . polyvinyl alcohol (ARTIFICIAL TEARS) 1.4 % ophthalmic solution Place 1 drop into both eyes 2 (two) times daily as needed for dry eyes.    Marland Kitchen warfarin (COUMADIN) 2.5 MG tablet Take as directed by Coumadin Clinic 30 tablet 0  . amiodarone (PACERONE) 200 MG tablet Take 1 tablet (200 mg total) by mouth 2 (two) times daily. 180 tablet 3  . metoprolol tartrate (LOPRESSOR) 25 MG tablet Take 1 tablet (25 mg total) by mouth 2 (two) times daily. 180 tablet 3   No facility-administered medications prior to visit.      Allergies:   Food; Other; Statins; Hydralazine; Fish oil; and Norvasc [amlodipine]   Social History   Socioeconomic History  . Marital status: Married    Spouse name: None  . Number of children: None  . Years of education: None  . Highest education level: None  Social Needs  . Financial resource strain: None  . Food insecurity - worry: None  . Food insecurity - inability: None  . Transportation needs - medical: None  . Transportation needs - non-medical: None  Occupational History  . None  Tobacco Use  . Smoking status: Former Smoker    Packs/day: 1.50    Years: 35.00    Pack years: 52.50    Types: Cigarettes    Last attempt to quit: 01/08/1994    Years since quitting: 23.3  . Smokeless tobacco: Never Used  Substance and Sexual Activity  . Alcohol use: Yes    Alcohol/week: 16.8 oz    Types: 28 Cans of beer per week    Comment: 3-5 beers everyday.  . Drug use: No  . Sexual activity: Not Currently  Other Topics Concern  . None  Social History Narrative  . None     Family History:  The patient's family history includes CAD in his father; Heart attack in his mother.   ROS:   Please see the history  of present illness.    Vision disturbance, significant bilateral leg swelling.  13 pound weight gain in 3 weeks.  Easy bruising, itching and irritation in the lower extremities. All other systems reviewed and are negative.   PHYSICAL EXAM:   VS:  BP (!) 214/84   Pulse (!) 51   Ht 5\' 8"  (1.727 m)   Wt 179 lb (81.2 kg)   BMI 27.22 kg/m    GEN: Well nourished, well developed, in no acute distress  HEENT: normal  Neck: no JVD, carotid bruits, or masses Cardiac: RRR; no murmurs, rubs, or gallops,no edema  Respiratory:  clear to auscultation bilaterally, normal work of breathing GI: soft, nontender, nondistended, + BS MS: no deformity or atrophy  Skin: warm and dry, no rash Neuro:  Alert and Oriented x 3, Strength and sensation are intact Psych: euthymic mood, full affect  Wt Readings from Last 3 Encounters:  05/18/17 179 lb (81.2 kg)  04/27/17 166 lb 6.4 oz (75.5 kg)  01/20/17 170 lb (77.1 kg)      Studies/Labs Reviewed:   EKG:  EKG sinus bradycardia at 51 bpm.  T waves.  Rightward axis.  Resolution of atrial fibrillation since the prior office visit.  Baseline artifact is present on EKG.  Recent Labs: 11/01/2016: TSH 3.84 11/05/2016: Hemoglobin 8.7; Platelets 245 01/24/2017: BUN 29; Creatinine, Ser 3.07; Potassium 5.1; Sodium 137   Lipid Panel No results found for: CHOL, TRIG, HDL, CHOLHDL, VLDL, LDLCALC, LDLDIRECT  Additional studies/ records that were reviewed today include:   Last creatinine done at [facility] was greater than 4.0 late November 2018.   ASSESSMENT:    1. Atrial fibrillation, unspecified type (HCC)   2. Essential hypertension   3. PAD (peripheral artery disease) (HCC)   4. Acute on chronic diastolic heart failure (HCC)   5. CKD (chronic kidney disease), stage IV (HCC)   6. Hx of CABG   7. Other hyperlipidemia   8. SOB (shortness of breath)      PLAN:  In order of problems listed above:  1. Atrial fibrillation has resolved to sinus bradycardia.   Discontinue metoprolol, and decreased amiodarone to 200 mg/day.  Unsure when patient converted.  Repeat EKG on return. 2. Marked elevation in blood pressure, patient will not allow us to start antihypertensive therapy because he worries about drug interaction/and skin rash.  We will need to speak with both Dr. Hyman HopesWebb and Dr. Clelia CroftShaw. 3. Not addressed 4. Volume overloaded.  Weight is up 13 pounds since the last visit 3 weeks ago.  He needs to be started on diuretic therapy but he is not willing.  We will check a BNP and basic metabolic panel today.  If BNP is markedly elevated, will have to forward with diuretic therapy.  Kidney function will also be helpful to know.  If declining kidney function was related to atrial fib with decreased cardiac output, hopefully will be better on this occasion.  Unsure how long he has been back in normal sinus rhythm. 5. Reassess kidney function.  Stage IV disease noted with creatinine greater than 4 at the end of November when seen by Dr. Clelia CroftShaw at [facility]. 6. Not addressed 7. Target LDL less than 70.  Not currently treated because of statin intolerance.  Basic metabolic panel and BNP today.  Further medication adjustment will be dependent upon results.  Needs to start diuretic therapy.  Will discuss with Dr. and will also discuss with Dr. Clelia CroftShaw.  Discontinue metoprolol.  Decrease amiodarone to 1 tablet/day.  EKG on return.  Needs diuretic therapy to resolve volume overload.    Medication Adjustments/Labs and Tests Ordered: Current medicines are reviewed at length with the patient today.  Concerns regarding medicines are outlined above.  Medication changes, Labs and Tests ordered today are listed in the Patient Instructions below. Patient Instructions  Medication Instructions:  1) DISCONTINUE Metoprolol 2) DECREASE Amiodarone to 200mg  once daily  Labwork: BMET and Pro BNP today  Testing/Procedures: None  Follow-Up: Your physician recommends that  you schedule a  follow-up appointment in: 3-4 weeks with Dr. Katrinka Blazing.    Any Other Special Instructions Will Be Listed Below (If Applicable).     If you need a refill on your cardiac medications before your next appointment, please call your pharmacy.      Signed, Edward Noe, MD  05/18/2017 3:15 PM    [facility] [address] [place], [place], [place]  [address] Phone: [phone]; Fax: [phone]

## 2017-05-18 ENCOUNTER — Encounter: Payer: Self-pay | Admitting: Interventional Cardiology

## 2017-05-18 ENCOUNTER — Ambulatory Visit (INDEPENDENT_AMBULATORY_CARE_PROVIDER_SITE_OTHER): Payer: Medicare Other

## 2017-05-18 ENCOUNTER — Ambulatory Visit (INDEPENDENT_AMBULATORY_CARE_PROVIDER_SITE_OTHER): Payer: Medicare Other | Admitting: Interventional Cardiology

## 2017-05-18 VITALS — BP 214/84 | HR 51 | Ht 68.0 in | Wt 179.0 lb

## 2017-05-18 DIAGNOSIS — R0602 Shortness of breath: Secondary | ICD-10-CM

## 2017-05-18 DIAGNOSIS — E7849 Other hyperlipidemia: Secondary | ICD-10-CM

## 2017-05-18 DIAGNOSIS — Z5181 Encounter for therapeutic drug level monitoring: Secondary | ICD-10-CM

## 2017-05-18 DIAGNOSIS — Z951 Presence of aortocoronary bypass graft: Secondary | ICD-10-CM

## 2017-05-18 DIAGNOSIS — N184 Chronic kidney disease, stage 4 (severe): Secondary | ICD-10-CM

## 2017-05-18 DIAGNOSIS — I25810 Atherosclerosis of coronary artery bypass graft(s) without angina pectoris: Secondary | ICD-10-CM | POA: Diagnosis not present

## 2017-05-18 DIAGNOSIS — I4891 Unspecified atrial fibrillation: Secondary | ICD-10-CM

## 2017-05-18 DIAGNOSIS — I10 Essential (primary) hypertension: Secondary | ICD-10-CM | POA: Diagnosis not present

## 2017-05-18 DIAGNOSIS — I739 Peripheral vascular disease, unspecified: Secondary | ICD-10-CM | POA: Diagnosis not present

## 2017-05-18 DIAGNOSIS — I5033 Acute on chronic diastolic (congestive) heart failure: Secondary | ICD-10-CM

## 2017-05-18 LAB — POCT INR: INR: 1.4

## 2017-05-18 MED ORDER — AMIODARONE HCL 200 MG PO TABS: 200.0000 mg | ORAL_TABLET | Freq: Every day | ORAL | 3 refills | Status: AC

## 2017-05-18 NOTE — Patient Instructions (Signed)
Medication Instructions:  1) DISCONTINUE Metoprolol 2) DECREASE Amiodarone to 200mg  once daily  Labwork: BMET and Pro BNP today  Testing/Procedures: None  Follow-Up: Your physician recommends that you schedule a follow-up appointment in: 3-4 weeks with Dr. Katrinka Blazing.    Any Other Special Instructions Will Be Listed Below (If Applicable).     If you need a refill on your cardiac medications before your next appointment, please call your pharmacy.

## 2017-05-18 NOTE — Patient Instructions (Signed)
Start taking 1/2 tablet (1.25mg ) daily except 1 tablet (2.5mg ) on Mondays, Wednesdays, and Fridays.  Coumadin Clinic#[phone] Main [phone].  Recheck in 1 week.

## 2017-05-19 ENCOUNTER — Telehealth: Payer: Self-pay | Admitting: *Deleted

## 2017-05-19 LAB — PRO B NATRIURETIC PEPTIDE: NT-PRO BNP: 53271 pg/mL — AB (ref 0–486)

## 2017-05-19 MED ORDER — FUROSEMIDE 80 MG PO TABS: 80.0000 mg | ORAL_TABLET | Freq: Every day | ORAL | 3 refills | Status: AC

## 2017-05-19 NOTE — Telephone Encounter (Signed)
Notes recorded by Lyn Records, MD on 05/19/2017 at 12:12 PM EST Let the patient know the BNP is extremely elevated. He was to have a simultaneous basic metabolic panel but was mistakenly not drawn. This data along with marked lower extremity swelling confirms the need for furosemide. He will not consent to therapy without the approval of his other physicians. He is it okay to start the patient on furosemide?Marland Kitchen We will plan to have him get a basic metabolic panel on 05/25/17 when he comes for INR check. Atrial fibrillation which was present 2 weeks ago when I saw him as the likely precipitant of CHF. He has spontaneously converted on amiodarone.  Spoke with Dr. Katrinka Blazing, who spoke to pt's nephrologist and they agreed pt should start Furosemide 80mg  QD. Pt will have BMET when he is here 12/26 for CVRR appt. Spoke with pt and went over recommendations. Pt verbalized understanding and was in agreement with this plan.

## 2017-05-25 ENCOUNTER — Ambulatory Visit (INDEPENDENT_AMBULATORY_CARE_PROVIDER_SITE_OTHER): Payer: Medicare Other | Admitting: Pharmacist

## 2017-05-25 ENCOUNTER — Other Ambulatory Visit: Payer: Medicare Other | Admitting: *Deleted

## 2017-05-25 DIAGNOSIS — Z5181 Encounter for therapeutic drug level monitoring: Secondary | ICD-10-CM

## 2017-05-25 DIAGNOSIS — Z951 Presence of aortocoronary bypass graft: Secondary | ICD-10-CM

## 2017-05-25 DIAGNOSIS — I4891 Unspecified atrial fibrillation: Secondary | ICD-10-CM | POA: Diagnosis not present

## 2017-05-25 DIAGNOSIS — I10 Essential (primary) hypertension: Secondary | ICD-10-CM

## 2017-05-25 LAB — BASIC METABOLIC PANEL
BUN/Creatinine Ratio: 7 — ABNORMAL LOW (ref 10–24)
BUN: 42 mg/dL — AB (ref 8–27)
CALCIUM: 8.2 mg/dL — AB (ref 8.6–10.2)
CO2: 19 mmol/L — AB (ref 20–29)
CREATININE: 5.69 mg/dL — AB (ref 0.76–1.27)
Chloride: 101 mmol/L (ref 96–106)
GFR calc Af Amer: 10 mL/min/{1.73_m2} — ABNORMAL LOW (ref >59)
GFR, EST NON AFRICAN AMERICAN: 9 mL/min/{1.73_m2} — AB (ref >59)
Glucose: 170 mg/dL — ABNORMAL HIGH (ref 65–99)
Potassium: 4.8 mmol/L (ref 3.5–5.2)
SODIUM: 136 mmol/L (ref 134–144)

## 2017-05-25 LAB — POCT INR: INR: 1.3

## 2017-05-25 NOTE — Patient Instructions (Signed)
Description   Take an extra 1/2 tablet today (since already took 1 tablet) then start taking 1 tablet (2.5mg ) daily except 1/2 tablet (1.25mg ) on Tuesdays, Thursdays and Sundays.  Coumadin Clinic#[phone] Main [phone].  Recheck in 1 week.

## 2017-05-28 ENCOUNTER — Other Ambulatory Visit: Payer: Self-pay | Admitting: Nephrology

## 2017-05-28 DIAGNOSIS — N183 Chronic kidney disease, stage 3 unspecified: Secondary | ICD-10-CM

## 2017-05-29 ENCOUNTER — Other Ambulatory Visit: Payer: Self-pay | Admitting: Interventional Cardiology

## 2017-05-30 ENCOUNTER — Ambulatory Visit
Admission: RE | Admit: 2017-05-30 | Discharge: 2017-05-30 | Disposition: A | Discharge status: Home / Self Care | Payer: Medicare Other | Source: Ambulatory Visit | Attending: Nephrology | Admitting: Nephrology

## 2017-05-30 DIAGNOSIS — N183 Chronic kidney disease, stage 3 unspecified: Secondary | ICD-10-CM

## 2017-06-01 ENCOUNTER — Ambulatory Visit (INDEPENDENT_AMBULATORY_CARE_PROVIDER_SITE_OTHER): Payer: Medicare Other | Admitting: *Deleted

## 2017-06-01 DIAGNOSIS — Z5181 Encounter for therapeutic drug level monitoring: Secondary | ICD-10-CM | POA: Diagnosis not present

## 2017-06-01 DIAGNOSIS — Z951 Presence of aortocoronary bypass graft: Secondary | ICD-10-CM | POA: Diagnosis not present

## 2017-06-01 DIAGNOSIS — I4891 Unspecified atrial fibrillation: Secondary | ICD-10-CM

## 2017-06-01 LAB — POCT INR: INR: 1.7

## 2017-06-01 NOTE — Patient Instructions (Signed)
Description   Today Jan 2nd take 1 and 1/2 tablets (3.75mg ) then  start taking 1 tablet (2.5mg ) daily except 1/2 tablet (1.25mg ) only on  Tuesdays and Thursdays .  Coumadin Clinic#[phone] Main [phone].  Recheck in 1 week. Pt needs to be rechecked on Jan 16th prior to procedure on the 16 th scheduled for 10:30 am needs to be there by 10 am

## 2017-06-08 ENCOUNTER — Ambulatory Visit (INDEPENDENT_AMBULATORY_CARE_PROVIDER_SITE_OTHER): Payer: Medicare Other | Admitting: *Deleted

## 2017-06-08 DIAGNOSIS — Z5181 Encounter for therapeutic drug level monitoring: Secondary | ICD-10-CM | POA: Diagnosis not present

## 2017-06-08 DIAGNOSIS — I4891 Unspecified atrial fibrillation: Secondary | ICD-10-CM

## 2017-06-08 DIAGNOSIS — Z951 Presence of aortocoronary bypass graft: Secondary | ICD-10-CM

## 2017-06-08 LAB — POCT INR: INR: 1.9

## 2017-06-08 NOTE — Patient Instructions (Signed)
Description   Today take 1.5 tablets, then change your dose to 1 tablet (2.5mg ) daily except 1/2 tablet (1.25mg ) only on  Tuesdays.  Coumadin Clinic#[phone] Main [phone].  Recheck in 1 week. Pt needs to be rechecked on Jan 16th prior to procedure on the 16 th scheduled for 10:30 am needs to be there by 10 am

## 2017-06-10 ENCOUNTER — Encounter: Payer: Self-pay | Admitting: Interventional Cardiology

## 2017-06-10 ENCOUNTER — Ambulatory Visit (INDEPENDENT_AMBULATORY_CARE_PROVIDER_SITE_OTHER): Payer: Medicare Other | Admitting: Interventional Cardiology

## 2017-06-10 VITALS — BP 192/70 | HR 69 | Ht 68.0 in | Wt 168.8 lb

## 2017-06-10 DIAGNOSIS — N184 Chronic kidney disease, stage 4 (severe): Secondary | ICD-10-CM

## 2017-06-10 DIAGNOSIS — I10 Essential (primary) hypertension: Secondary | ICD-10-CM

## 2017-06-10 DIAGNOSIS — I5033 Acute on chronic diastolic (congestive) heart failure: Secondary | ICD-10-CM | POA: Diagnosis not present

## 2017-06-10 DIAGNOSIS — I739 Peripheral vascular disease, unspecified: Secondary | ICD-10-CM

## 2017-06-10 DIAGNOSIS — I480 Paroxysmal atrial fibrillation: Secondary | ICD-10-CM

## 2017-06-10 DIAGNOSIS — Z951 Presence of aortocoronary bypass graft: Secondary | ICD-10-CM | POA: Diagnosis not present

## 2017-06-10 NOTE — Progress Notes (Signed)
Cardiology Office Note    Date:  06/10/2017   ID:  Murdock, Jellison Dec 23, 1940, MRN [id number]  PCP:  Lupita Raider, MD  Cardiologist: Lesleigh Noe, MD   Chief Complaint  Patient presents with  . Atrial Fibrillation  . Coronary Artery Disease  . Congestive Heart Failure    History of Present Illness:  [name] is a 77 y.o. male with CAD, prior coronary bypass grafting, significant native vessel disease, diastolic dysfunction, and chronic kidney disease stage IV.  In 2018 the patient underwent extremity angiography with stenting of both iliac arteries but no improvement in claudication.  (Dr. Nanetta Batty).  He had a full body rash earlier this year and stopped taking all of his medications. Discovered to have asymptomatic AF on last OV.   [name] is following up after starting diuretic therapy because of marked volume overload with significant lower extremity edema.  He was not short of breath.  Diuretic therapy had been discontinued by primary because of increasing creatinine.  Patient has stage IV/V chronic kidney disease.  When last seen furosemide 80 mg/day was added.  Follow-up creatinine 7 days later was 5.7 BUN 42 potassium 4.8.  He has lost 10 pounds since starting furosemide.  Feels better.   Past Medical History:  Diagnosis Date  . Alcohol abuse   . Arthritis    "just about qwhere now" (08/28/2015)  . Benign prostatic hyperplasia   . CAD (coronary artery disease) 2005   mLAD ~90%, D1/RI-70, mRCA 70% --> CABG 3: LIMA-LAD, SVG-D1/RI, SVG-dRCA  . HTN (hypertension)   . Hyperlipemia   . Iron deficiency anemia   . Myocardial infarction [facility]) 1995   s/p PTCA  . Obesity   . Peripheral vascular disease (HCC)   . PONV (postoperative nausea and vomiting)   . Type II diabetes mellitus (HCC)     Past Surgical History:  Procedure Laterality Date  . ABDOMINAL HERNIA REPAIR    . CARDIAC CATHETERIZATION  2005   mLAD ~90%, D1/RI-70, mRCA 70%   . CARDIAC  CATHETERIZATION N/A 08/29/2015   Procedure: Left Heart Cath and Coronary Angiography;  Surgeon: Marykay Lex, MD;  Location: [facility] INVASIVE CV LAB;  Service: Cardiovascular;  Laterality: N/A;  . CAROTID ENDARTERECTOMY    . CORONARY ANGIOPLASTY WITH STENT PLACEMENT  12/1993; 04/1994  . CORONARY ARTERY BYPASS GRAFT  2005   CABG 3: LIMA-LAD, SVG-D1/RI, SVG-dRCA (Dr.VanTrigt)  . EYE SURGERY Left 1968   "dug hot steel out that was imbedded"   . HERNIA REPAIR    . INGUINAL HERNIA REPAIR Right   . KNEE ARTHROSCOPY Left   . KNEE CARTILAGE SURGERY Right 1960s   "took cartilage out"  . LOWER EXTREMITY ANGIOGRAPHY N/A 09/27/2016   Procedure: Lower Extremity Angiography;  Surgeon: Runell Gess, MD;  Location: [facility] INVASIVE CV LAB;  Service: Cardiovascular;  Laterality: N/A;  . LOWER EXTREMITY INTERVENTION N/A 11/04/2016   Procedure: Lower Extremity Intervention;  Surgeon: Runell Gess, MD;  Location: [facility] INVASIVE CV LAB;  Service: Cardiovascular;  Laterality: N/A;  . SHOULDER ARTHROSCOPY W/ ROTATOR CUFF REPAIR Left   . SKIN CANCER DESTRUCTION     "6 cut off" (08/28/2015)  . UMBILICAL HERNIA REPAIR      Current Medications: Outpatient Medications Prior to Visit  Medication Sig Dispense Refill  . acetaminophen (TYLENOL) 325 MG tablet Take 2 tablets (650 mg total) by mouth every 4 (four) hours as needed for headache or mild pain.    Marland Kitchen  amiodarone (PACERONE) 200 MG tablet Take 1 tablet (200 mg total) by mouth daily. 90 tablet 3  . aspirin EC 81 MG tablet Take 81 mg by mouth at bedtime.     . Bacitracin-Polymyxin B (POLYSPORIN EX) Apply 1 application topically 2 (two) times daily as needed (wound care).     . Coenzyme Q10 (CO Q-10) 100 MG CAPS Take 100 mg by mouth daily.    Marland Kitchen docusate sodium (COLACE) 50 MG capsule Take 1 capsule (50 mg total) by mouth 2 (two) times daily. 60 capsule 11  . ezetimibe (ZETIA) 10 MG tablet Take 5 mg by mouth daily.    . ferrous sulfate 325 (65 FE) MG tablet Take 325 mg  by mouth at bedtime.     . furosemide (LASIX) 80 MG tablet Take 1 tablet (80 mg total) by mouth daily. 90 tablet 3  . ibuprofen (ADVIL,MOTRIN) 200 MG tablet Take 400 mg by mouth 2 (two) times daily as needed for mild pain.    . nitroGLYCERIN (NITROSTAT) 0.4 MG SL tablet Place 0.4 mg under the tongue every 5 (five) minutes as needed for chest pain.    Marland Kitchen Oxymetazoline HCl (NASAL SPRAY NA) Place 1 spray into the nose 2 (two) times daily as needed (nasal congestion).    . polyvinyl alcohol (ARTIFICIAL TEARS) 1.4 % ophthalmic solution Place 1 drop into both eyes 2 (two) times daily as needed for dry eyes.    Marland Kitchen warfarin (COUMADIN) 2.5 MG tablet TAKE AS DIRECTED BY COUMADIN CLINIC 30 tablet 1   No facility-administered medications prior to visit.      Allergies:   Food; Other; Statins; Hydralazine; Metoprolol; Fish oil; and Norvasc [amlodipine]   Social History   Socioeconomic History  . Marital status: Married    Spouse name: None  . Number of children: None  . Years of education: None  . Highest education level: None  Social Needs  . Financial resource strain: None  . Food insecurity - worry: None  . Food insecurity - inability: None  . Transportation needs - medical: None  . Transportation needs - non-medical: None  Occupational History  . None  Tobacco Use  . Smoking status: Former Smoker    Packs/day: 1.50    Years: 35.00    Pack years: 52.50    Types: Cigarettes    Last attempt to quit: 01/08/1994    Years since quitting: 23.4  . Smokeless tobacco: Never Used  Substance and Sexual Activity  . Alcohol use: Yes    Alcohol/week: 16.8 oz    Types: 28 Cans of beer per week    Comment: 3-5 beers everyday.  . Drug use: No  . Sexual activity: Not Currently  Other Topics Concern  . None  Social History Narrative  . None     Family History:  The patient's family history includes CAD in his father; Heart attack in his mother.   ROS:   Please see the history of present  illness.    Disturbance, skin lesions with bleeding eschars in multiple sites on his face and neck following skin biopsies done within the past month.  Further dermatologic procedures need to be done on the face and neck within the coming week.  Denies orthopnea and PND. All other systems reviewed and are negative.   PHYSICAL EXAM:   VS:  BP (!) 192/70   Pulse 69   Ht 5\' 8"  (1.727 m)   Wt 168 lb 12.8 oz (76.6 kg)   BMI  25.67 kg/m    GEN: Well nourished, well developed, in no acute distress  HEENT: normal  Neck: no JVD, carotid bruits, or masses Cardiac: RRR; no murmurs, rubs, or gallops,no edema  Respiratory:  clear to auscultation bilaterally, normal work of breathing GI: soft, nontender, nondistended, + BS MS: no deformity or atrophy  Skin: warm and dry, no rash Neuro:  Alert and Oriented x 3, Strength and sensation are intact Psych: euthymic mood, full affect  Wt Readings from Last 3 Encounters:  06/10/17 168 lb 12.8 oz (76.6 kg)  05/18/17 179 lb (81.2 kg)  04/27/17 166 lb 6.4 oz (75.5 kg)      Studies/Labs Reviewed:   EKG:  EKG rhythm at 69 bpm with interventricular conduction delay and nonspecific T wave flattening.  Small inferior Q waves.  Recent Labs: 11/01/2016: TSH 3.84 11/05/2016: Hemoglobin 8.7; Platelets 245 05/18/2017: NT-Pro BNP 53,271 05/25/2017: BUN 42; Creatinine, Ser 5.69; Potassium 4.8; Sodium 136   Lipid Panel No results found for: CHOL, TRIG, HDL, CHOLHDL, VLDL, LDLCALC, LDLDIRECT  Additional studies/ records that were reviewed today include:  No new data    ASSESSMENT:    1. Acute on chronic diastolic heart failure (HCC)   2. Paroxysmal atrial fibrillation (HCC)   3. Essential hypertension   4. CKD (chronic kidney disease), stage IV (HCC)   5. Hx of CABG   6. PAD (peripheral artery disease) (HCC)      PLAN:  In order of problems listed above:  1. Volume overload has resolved on furosemide 80 mg/day.  Creatinine has bumped up to 5.6.   Good urine output.  He will be seen in nephrology in 1 week.  We will continue current therapy.  He has had a 10-12 pound diuresis on furosemide. 2. Maintaining normal sinus rhythm on amiodarone 200 mg/day. 3. Still significant elevation in blood pressure.  This is likely renally mediated. 4. Stage IV/V CKD but with good urine output on diuretic therapy. 5. No symptoms of angina 6. Not addressed  Clinical follow-up in 3 months.  Continue diuretic therapy as listed.  Call if recurrent increase in lower extremity edema.  Will be interested in nephrology thoughts after he is seen next week.    Medication Adjustments/Labs and Tests Ordered: Current medicines are reviewed at length with the patient today.  Concerns regarding medicines are outlined above.  Medication changes, Labs and Tests ordered today are listed in the Patient Instructions below. Patient Instructions  Medication Instructions:  Your physician recommends that you continue on your current medications as directed. Please refer to the Current Medication list given to you today.  Labwork: None  Testing/Procedures: None  Follow-Up: Your physician recommends that you schedule a follow-up appointment in: 3 months with Dr. Katrinka Blazing.    Any Other Special Instructions Will Be Listed Below (If Applicable).     If you need a refill on your cardiac medications before your next appointment, please call your pharmacy.      Signed, Lesleigh Noe, MD  06/10/2017 3:14 PM    [facility] [address] [place], [place], [place]  [address] Phone: [phone]; Fax: [phone]

## 2017-06-10 NOTE — Patient Instructions (Signed)
Medication Instructions:  Your physician recommends that you continue on your current medications as directed. Please refer to the Current Medication list given to you today.  Labwork: None  Testing/Procedures: None  Follow-Up: Your physician recommends that you schedule a follow-up appointment in: 3 months with Dr. Smith.    Any Other Special Instructions Will Be Listed Below (If Applicable).     If you need a refill on your cardiac medications before your next appointment, please call your pharmacy.   

## 2017-06-14 NOTE — Addendum Note (Signed)
Addended by: Channing Mutters on: 06/14/2017 08:43 AM   Modules accepted: Orders

## 2017-06-15 ENCOUNTER — Telehealth: Payer: Self-pay | Admitting: Interventional Cardiology

## 2017-06-15 ENCOUNTER — Ambulatory Visit (INDEPENDENT_AMBULATORY_CARE_PROVIDER_SITE_OTHER): Payer: Medicare Other | Admitting: *Deleted

## 2017-06-15 DIAGNOSIS — Z951 Presence of aortocoronary bypass graft: Secondary | ICD-10-CM

## 2017-06-15 DIAGNOSIS — Z5181 Encounter for therapeutic drug level monitoring: Secondary | ICD-10-CM | POA: Diagnosis not present

## 2017-06-15 DIAGNOSIS — I4891 Unspecified atrial fibrillation: Secondary | ICD-10-CM | POA: Diagnosis not present

## 2017-06-15 LAB — POCT INR: INR: 2.4

## 2017-06-15 NOTE — Telephone Encounter (Signed)
New Message     Wife calling - patient [facility] to [facility] and they would not do his procedure due to patient bp being elevated.    bp was 222/88 hr 67 at 1pm today    Pt c/o BP issue: STAT if pt c/o blurred vision, one-sided weakness or slurred speech  1. What are your last 5 BP readings? 222/88 67hr  2. Are you having any other symptoms (ex. Dizziness, headache, blurred vision, passed out)? no  3. What is your BP issue? Elevated bp

## 2017-06-15 NOTE — Patient Instructions (Signed)
Description   Continue taking  1 tablet (2.5mg ) daily except 1/2 tablet (1.25mg ) only on  Tuesdays.  Coumadin Clinic#[phone] Main [phone].  Recheck in 1 week.

## 2017-06-15 NOTE — Telephone Encounter (Signed)
Spoke with Dr. Katrinka Blazing prior to calling and he said we could try clonidine 0.1mg  BID.  Spoke with wife and she states [facility] and BP was 222/88 and Dr. Jeanella Craze would not continue with plan for surgery.  Pt denies any sx when BP elevated.  Wife states pt was seen by Dr. Hyman Hopes yesterday and he started pt on Clonidine 0.1mg  patch QW. Advised wife to continue monitoring BP and let us or Dr. Hyman Hopes know if BP remains elevated on the new patch.  Wife verbalized understanding and was in agreement with this plan.

## 2017-06-22 ENCOUNTER — Ambulatory Visit (INDEPENDENT_AMBULATORY_CARE_PROVIDER_SITE_OTHER): Payer: Medicare Other | Admitting: *Deleted

## 2017-06-22 DIAGNOSIS — I4891 Unspecified atrial fibrillation: Secondary | ICD-10-CM | POA: Diagnosis not present

## 2017-06-22 DIAGNOSIS — Z951 Presence of aortocoronary bypass graft: Secondary | ICD-10-CM | POA: Diagnosis not present

## 2017-06-22 DIAGNOSIS — Z5181 Encounter for therapeutic drug level monitoring: Secondary | ICD-10-CM

## 2017-06-22 LAB — POCT INR: INR: 2

## 2017-06-22 NOTE — Patient Instructions (Signed)
Description   Today take 1.5 tablets then continue taking  1 tablet (2.5mg ) daily except 1/2 tablet (1.25mg ) only on Tuesdays.  [facility]#[id number] Main [phone].  Recheck in 1 week. Possible DCCV.

## 2017-06-28 ENCOUNTER — Ambulatory Visit (INDEPENDENT_AMBULATORY_CARE_PROVIDER_SITE_OTHER): Payer: Medicare Other | Admitting: *Deleted

## 2017-06-28 DIAGNOSIS — Z951 Presence of aortocoronary bypass graft: Secondary | ICD-10-CM | POA: Diagnosis not present

## 2017-06-28 DIAGNOSIS — Z5181 Encounter for therapeutic drug level monitoring: Secondary | ICD-10-CM

## 2017-06-28 DIAGNOSIS — I4891 Unspecified atrial fibrillation: Secondary | ICD-10-CM | POA: Diagnosis not present

## 2017-06-28 LAB — POCT INR: INR: 2.6

## 2017-06-28 NOTE — Patient Instructions (Signed)
Description   Continue taking  1 tablet (2.5mg ) daily except 1/2 tablet (1.25mg ) only on Tuesdays.  [facility]#[phone] Main [phone].  Recheck in 2 weeks.Marland Kitchen

## 2017-07-06 ENCOUNTER — Telehealth: Payer: Self-pay | Admitting: Interventional Cardiology

## 2017-07-06 NOTE — Telephone Encounter (Signed)
Spoke with pt and he states since he started Coumadin he has had slight bleeding that runs down the back of the nasal passages to his throat.  States when he lays down at night it builds up and when he wakes up in the morning he coughs up a large, black blood clot.  Pt also mentioned that he constantly feels lightheaded but never to the point that he feels like he's going to pass out, more of a feeling that he has "something always sitting on his head".  Advised I would send message to Dr. Katrinka Blazing for review and would call back once I hear back.  Pt appreciative for call.

## 2017-07-06 NOTE — Telephone Encounter (Signed)
New Message  Pt c/o medication issue:  1. Name of Medication: Counadin  2. How are you currently taking this medication (dosage and times per day)? TAKE AS DIRECTED BY [facility]ADIN CLINIC  3. Are you having a reaction (difficulty breathing--STAT)? yes  4. What is your medication issue? Nose bleed, running in the back of nasal passages

## 2017-07-10 NOTE — Telephone Encounter (Signed)
I don't have any recommendations unless he wants to come fro repeat INR to r/o over anticoagulation.

## 2017-07-11 ENCOUNTER — Other Ambulatory Visit: Payer: Self-pay | Admitting: Family Medicine

## 2017-07-11 ENCOUNTER — Emergency Department (HOSPITAL_COMMUNITY): Payer: Medicare Other

## 2017-07-11 ENCOUNTER — Encounter (HOSPITAL_COMMUNITY): Admission: EM | Disposition: E | Payer: Self-pay | Source: Home / Self Care | Attending: Internal Medicine

## 2017-07-11 ENCOUNTER — Encounter (HOSPITAL_COMMUNITY): Payer: Self-pay

## 2017-07-11 ENCOUNTER — Inpatient Hospital Stay (HOSPITAL_COMMUNITY)
Admission: EM | Admit: 2017-07-11 | Discharge: 2017-07-29 | DRG: 981 | Disposition: E | Payer: Medicare Other | Attending: Internal Medicine | Admitting: Internal Medicine

## 2017-07-11 ENCOUNTER — Telehealth: Payer: Self-pay

## 2017-07-11 ENCOUNTER — Ambulatory Visit
Admission: RE | Admit: 2017-07-11 | Discharge: 2017-07-11 | Disposition: A | Discharge status: Home / Self Care | Payer: Medicare Other | Source: Ambulatory Visit | Attending: Family Medicine | Admitting: Family Medicine

## 2017-07-11 ENCOUNTER — Inpatient Hospital Stay (HOSPITAL_COMMUNITY): Payer: Medicare Other

## 2017-07-11 ENCOUNTER — Other Ambulatory Visit: Payer: Self-pay

## 2017-07-11 ENCOUNTER — Emergency Department (HOSPITAL_COMMUNITY): Payer: Medicare Other | Admitting: Certified Registered"

## 2017-07-11 DIAGNOSIS — Z7982 Long term (current) use of aspirin: Secondary | ICD-10-CM

## 2017-07-11 DIAGNOSIS — R0902 Hypoxemia: Secondary | ICD-10-CM | POA: Diagnosis not present

## 2017-07-11 DIAGNOSIS — Z955 Presence of coronary angioplasty implant and graft: Secondary | ICD-10-CM

## 2017-07-11 DIAGNOSIS — Y92238 Other place in hospital as the place of occurrence of the external cause: Secondary | ICD-10-CM | POA: Diagnosis present

## 2017-07-11 DIAGNOSIS — J9621 Acute and chronic respiratory failure with hypoxia: Secondary | ICD-10-CM | POA: Diagnosis not present

## 2017-07-11 DIAGNOSIS — S68126A Partial traumatic metacarpophalangeal amputation of right little finger, initial encounter: Secondary | ICD-10-CM | POA: Diagnosis present

## 2017-07-11 DIAGNOSIS — I5033 Acute on chronic diastolic (congestive) heart failure: Secondary | ICD-10-CM | POA: Diagnosis present

## 2017-07-11 DIAGNOSIS — J189 Pneumonia, unspecified organism: Secondary | ICD-10-CM

## 2017-07-11 DIAGNOSIS — Z85828 Personal history of other malignant neoplasm of skin: Secondary | ICD-10-CM | POA: Diagnosis not present

## 2017-07-11 DIAGNOSIS — Z7189 Other specified counseling: Secondary | ICD-10-CM

## 2017-07-11 DIAGNOSIS — S61214A Laceration without foreign body of right ring finger without damage to nail, initial encounter: Secondary | ICD-10-CM | POA: Diagnosis present

## 2017-07-11 DIAGNOSIS — N179 Acute kidney failure, unspecified: Secondary | ICD-10-CM | POA: Diagnosis present

## 2017-07-11 DIAGNOSIS — Z515 Encounter for palliative care: Secondary | ICD-10-CM | POA: Diagnosis not present

## 2017-07-11 DIAGNOSIS — Z79899 Other long term (current) drug therapy: Secondary | ICD-10-CM

## 2017-07-11 DIAGNOSIS — I5032 Chronic diastolic (congestive) heart failure: Secondary | ICD-10-CM | POA: Diagnosis present

## 2017-07-11 DIAGNOSIS — Z7901 Long term (current) use of anticoagulants: Secondary | ICD-10-CM | POA: Diagnosis not present

## 2017-07-11 DIAGNOSIS — I252 Old myocardial infarction: Secondary | ICD-10-CM | POA: Diagnosis not present

## 2017-07-11 DIAGNOSIS — N184 Chronic kidney disease, stage 4 (severe): Secondary | ICD-10-CM | POA: Diagnosis present

## 2017-07-11 DIAGNOSIS — I10 Essential (primary) hypertension: Secondary | ICD-10-CM | POA: Diagnosis not present

## 2017-07-11 DIAGNOSIS — E872 Acidosis: Secondary | ICD-10-CM | POA: Diagnosis present

## 2017-07-11 DIAGNOSIS — I130 Hypertensive heart and chronic kidney disease with heart failure and stage 1 through stage 4 chronic kidney disease, or unspecified chronic kidney disease: Secondary | ICD-10-CM | POA: Diagnosis not present

## 2017-07-11 DIAGNOSIS — D62 Acute posthemorrhagic anemia: Secondary | ICD-10-CM | POA: Diagnosis present

## 2017-07-11 DIAGNOSIS — Z951 Presence of aortocoronary bypass graft: Secondary | ICD-10-CM | POA: Diagnosis not present

## 2017-07-11 DIAGNOSIS — Z23 Encounter for immunization: Secondary | ICD-10-CM

## 2017-07-11 DIAGNOSIS — J9601 Acute respiratory failure with hypoxia: Secondary | ICD-10-CM

## 2017-07-11 DIAGNOSIS — I2510 Atherosclerotic heart disease of native coronary artery without angina pectoris: Secondary | ICD-10-CM | POA: Diagnosis not present

## 2017-07-11 DIAGNOSIS — Z66 Do not resuscitate: Secondary | ICD-10-CM | POA: Diagnosis present

## 2017-07-11 DIAGNOSIS — R040 Epistaxis: Secondary | ICD-10-CM | POA: Diagnosis present

## 2017-07-11 DIAGNOSIS — E875 Hyperkalemia: Secondary | ICD-10-CM | POA: Diagnosis not present

## 2017-07-11 DIAGNOSIS — R0489 Hemorrhage from other sites in respiratory passages: Secondary | ICD-10-CM | POA: Diagnosis present

## 2017-07-11 DIAGNOSIS — I340 Nonrheumatic mitral (valve) insufficiency: Secondary | ICD-10-CM | POA: Diagnosis not present

## 2017-07-11 DIAGNOSIS — E7849 Other hyperlipidemia: Secondary | ICD-10-CM | POA: Diagnosis not present

## 2017-07-11 DIAGNOSIS — D649 Anemia, unspecified: Secondary | ICD-10-CM | POA: Diagnosis present

## 2017-07-11 DIAGNOSIS — E1159 Type 2 diabetes mellitus with other circulatory complications: Secondary | ICD-10-CM | POA: Diagnosis present

## 2017-07-11 DIAGNOSIS — R630 Anorexia: Secondary | ICD-10-CM | POA: Diagnosis not present

## 2017-07-11 DIAGNOSIS — I160 Hypertensive urgency: Secondary | ICD-10-CM | POA: Diagnosis not present

## 2017-07-11 DIAGNOSIS — S68129A Partial traumatic metacarpophalangeal amputation of unspecified finger, initial encounter: Secondary | ICD-10-CM | POA: Diagnosis not present

## 2017-07-11 DIAGNOSIS — E1151 Type 2 diabetes mellitus with diabetic peripheral angiopathy without gangrene: Secondary | ICD-10-CM | POA: Diagnosis present

## 2017-07-11 DIAGNOSIS — N400 Enlarged prostate without lower urinary tract symptoms: Secondary | ICD-10-CM | POA: Diagnosis present

## 2017-07-11 DIAGNOSIS — I5043 Acute on chronic combined systolic (congestive) and diastolic (congestive) heart failure: Secondary | ICD-10-CM | POA: Diagnosis not present

## 2017-07-11 DIAGNOSIS — Z87891 Personal history of nicotine dependence: Secondary | ICD-10-CM | POA: Diagnosis not present

## 2017-07-11 DIAGNOSIS — W230XXA Caught, crushed, jammed, or pinched between moving objects, initial encounter: Secondary | ICD-10-CM | POA: Diagnosis not present

## 2017-07-11 DIAGNOSIS — I739 Peripheral vascular disease, unspecified: Secondary | ICD-10-CM | POA: Diagnosis not present

## 2017-07-11 DIAGNOSIS — S62636B Displaced fracture of distal phalanx of right little finger, initial encounter for open fracture: Secondary | ICD-10-CM | POA: Diagnosis present

## 2017-07-11 DIAGNOSIS — E785 Hyperlipidemia, unspecified: Secondary | ICD-10-CM | POA: Diagnosis present

## 2017-07-11 DIAGNOSIS — I361 Nonrheumatic tricuspid (valve) insufficiency: Secondary | ICD-10-CM | POA: Diagnosis not present

## 2017-07-11 DIAGNOSIS — R0600 Dyspnea, unspecified: Secondary | ICD-10-CM

## 2017-07-11 DIAGNOSIS — N189 Chronic kidney disease, unspecified: Secondary | ICD-10-CM | POA: Diagnosis not present

## 2017-07-11 DIAGNOSIS — E1122 Type 2 diabetes mellitus with diabetic chronic kidney disease: Secondary | ICD-10-CM | POA: Diagnosis present

## 2017-07-11 DIAGNOSIS — Z8249 Family history of ischemic heart disease and other diseases of the circulatory system: Secondary | ICD-10-CM | POA: Diagnosis not present

## 2017-07-11 DIAGNOSIS — R0602 Shortness of breath: Secondary | ICD-10-CM

## 2017-07-11 DIAGNOSIS — D638 Anemia in other chronic diseases classified elsewhere: Secondary | ICD-10-CM | POA: Diagnosis present

## 2017-07-11 DIAGNOSIS — I4891 Unspecified atrial fibrillation: Secondary | ICD-10-CM | POA: Diagnosis present

## 2017-07-11 DIAGNOSIS — I468 Cardiac arrest due to other underlying condition: Secondary | ICD-10-CM | POA: Diagnosis not present

## 2017-07-11 DIAGNOSIS — I480 Paroxysmal atrial fibrillation: Secondary | ICD-10-CM | POA: Diagnosis not present

## 2017-07-11 HISTORY — PX: OPEN REDUCTION INTERNAL FIXATION (ORIF) METACARPAL: SHX6234

## 2017-07-11 LAB — PROTIME-INR
INR: 3.16
PROTHROMBIN TIME: 32.2 s — AB (ref 11.4–15.2)

## 2017-07-11 LAB — BASIC METABOLIC PANEL
ANION GAP: 13 (ref 5–15)
BUN: 70 mg/dL — ABNORMAL HIGH (ref 6–20)
CO2: 16 mmol/L — ABNORMAL LOW (ref 22–32)
Calcium: 8.2 mg/dL — ABNORMAL LOW (ref 8.9–10.3)
Chloride: 107 mmol/L (ref 101–111)
Creatinine, Ser: 7.28 mg/dL — ABNORMAL HIGH (ref 0.61–1.24)
GFR calc Af Amer: 7 mL/min — ABNORMAL LOW (ref >60)
GFR, EST NON AFRICAN AMERICAN: 6 mL/min — AB (ref >60)
GLUCOSE: 182 mg/dL — AB (ref 65–99)
POTASSIUM: 4.4 mmol/L (ref 3.5–5.1)
Sodium: 136 mmol/L (ref 135–145)

## 2017-07-11 LAB — URINALYSIS, ROUTINE W REFLEX MICROSCOPIC
BACTERIA UA: NONE SEEN
BILIRUBIN URINE: NEGATIVE
Glucose, UA: 150 mg/dL — AB
KETONES UR: NEGATIVE mg/dL
LEUKOCYTES UA: NEGATIVE
NITRITE: NEGATIVE
PROTEIN: 100 mg/dL — AB
Specific Gravity, Urine: 1.014 (ref 1.005–1.030)
Squamous Epithelial / LPF: NONE SEEN
pH: 5 (ref 5.0–8.0)

## 2017-07-11 LAB — CBC
HEMATOCRIT: 21.2 % — AB (ref 39.0–52.0)
HEMOGLOBIN: 6.6 g/dL — AB (ref 13.0–17.0)
MCH: 29.7 pg (ref 26.0–34.0)
MCHC: 31.1 g/dL (ref 30.0–36.0)
MCV: 95.5 fL (ref 78.0–100.0)
Platelets: 407 10*3/uL — ABNORMAL HIGH (ref 150–400)
RBC: 2.22 MIL/uL — ABNORMAL LOW (ref 4.22–5.81)
RDW: 14.4 % (ref 11.5–15.5)
WBC: 16.3 10*3/uL — AB (ref 4.0–10.5)

## 2017-07-11 LAB — I-STAT VENOUS BLOOD GAS, ED
ACID-BASE DEFICIT: 11 mmol/L — AB (ref 0.0–2.0)
BICARBONATE: 14.8 mmol/L — AB (ref 20.0–28.0)
O2 Saturation: 30 %
PH VEN: 7.257 (ref 7.250–7.430)
TCO2: 16 mmol/L — ABNORMAL LOW (ref 22–32)
pCO2, Ven: 33.2 mmHg — ABNORMAL LOW (ref 44.0–60.0)
pO2, Ven: 22 mmHg — CL (ref 32.0–45.0)

## 2017-07-11 LAB — I-STAT TROPONIN, ED: Troponin i, poc: 0.12 ng/mL (ref 0.00–0.08)

## 2017-07-11 LAB — TROPONIN I: TROPONIN I: 0.11 ng/mL — AB (ref <0.03)

## 2017-07-11 LAB — RETICULOCYTES
RBC.: 2.11 MIL/uL — AB (ref 4.22–5.81)
RETIC CT PCT: 4.6 % — AB (ref 0.4–3.1)
Retic Count, Absolute: 97.1 10*3/uL (ref 19.0–186.0)

## 2017-07-11 LAB — I-STAT CG4 LACTIC ACID, ED: Lactic Acid, Venous: 1.03 mmol/L (ref 0.5–1.9)

## 2017-07-11 LAB — BRAIN NATRIURETIC PEPTIDE: B Natriuretic Peptide: 2166.2 pg/mL — ABNORMAL HIGH (ref 0.0–100.0)

## 2017-07-11 LAB — POC OCCULT BLOOD, ED: Fecal Occult Bld: POSITIVE — AB

## 2017-07-11 SURGERY — OPEN REDUCTION INTERNAL FIXATION (ORIF) METACARPAL
Anesthesia: Monitor Anesthesia Care | Laterality: Right

## 2017-07-11 MED ORDER — TETANUS-DIPHTHERIA TOXOIDS TD 5-2 LFU IM INJ
0.5000 mL | INJECTION | Freq: Once | INTRAMUSCULAR | Status: DC
  Filled 2017-07-11: qty 0.5

## 2017-07-11 MED ORDER — ONDANSETRON HCL 4 MG/2ML IJ SOLN
4.0000 mg | Freq: Four times a day (QID) | INTRAMUSCULAR | Status: DC | PRN
Start: 2017-07-11 — End: 2017-07-11

## 2017-07-11 MED ORDER — BUPIVACAINE HCL (PF) 0.25 % IJ SOLN
INTRAMUSCULAR | Status: DC | PRN
  Administered 2017-07-11: 10 mL

## 2017-07-11 MED ORDER — SODIUM CHLORIDE 0.9 % IV SOLN
1.0000 g | Freq: Once | INTRAVENOUS | Status: AC
  Administered 2017-07-11: 1 g via INTRAVENOUS
  Filled 2017-07-11: qty 10

## 2017-07-11 MED ORDER — SODIUM CHLORIDE 0.9 % IV SOLN
100.0000 mg | Freq: Two times a day (BID) | INTRAVENOUS | Status: DC
  Administered 2017-07-11 – 2017-07-16 (×11): 100 mg via INTRAVENOUS
  Filled 2017-07-11 (×12): qty 100

## 2017-07-11 MED ORDER — OXYCODONE HCL 5 MG/5ML PO SOLN: 5.0000 mg | Freq: Once | ORAL | Status: DC | PRN

## 2017-07-11 MED ORDER — BUPIVACAINE HCL (PF) 0.25 % IJ SOLN
INTRAMUSCULAR | Status: AC
  Filled 2017-07-11: qty 30

## 2017-07-11 MED ORDER — EPHEDRINE 5 MG/ML INJ
INTRAVENOUS | Status: AC
  Filled 2017-07-11: qty 10

## 2017-07-11 MED ORDER — OXYCODONE HCL 5 MG PO TABS: 5.0000 mg | ORAL_TABLET | Freq: Once | ORAL | Status: DC | PRN

## 2017-07-11 MED ORDER — SODIUM CHLORIDE 0.9 % IV SOLN
500.0000 mg | Freq: Once | INTRAVENOUS | Status: DC
  Filled 2017-07-11: qty 500

## 2017-07-11 MED ORDER — SODIUM CHLORIDE 0.9 % IV SOLN
1000.0000 mL | INTRAVENOUS | Status: DC
  Administered 2017-07-11: 1000 mL via INTRAVENOUS

## 2017-07-11 MED ORDER — MORPHINE SULFATE (PF) 4 MG/ML IV SOLN
2.0000 mg | INTRAVENOUS | Status: AC
  Administered 2017-07-11: 2 mg via INTRAVENOUS
  Filled 2017-07-11: qty 1

## 2017-07-11 MED ORDER — FENTANYL CITRATE (PF) 250 MCG/5ML IJ SOLN
INTRAMUSCULAR | Status: AC
  Filled 2017-07-11: qty 5

## 2017-07-11 MED ORDER — LIDOCAINE HCL (PF) 1 % IJ SOLN
INTRAMUSCULAR | Status: AC
  Filled 2017-07-11: qty 30

## 2017-07-11 MED ORDER — ONDANSETRON HCL 4 MG/2ML IJ SOLN
4.0000 mg | Freq: Four times a day (QID) | INTRAMUSCULAR | Status: DC | PRN
  Administered 2017-07-11: 4 mg via INTRAVENOUS
  Filled 2017-07-11: qty 2

## 2017-07-11 MED ORDER — LIDOCAINE HCL 1 % IJ SOLN
INTRAMUSCULAR | Status: DC | PRN
  Administered 2017-07-11: 10 mL

## 2017-07-11 MED ORDER — TETANUS-DIPHTH-ACELL PERTUSSIS 5-2.5-18.5 LF-MCG/0.5 IM SUSP
0.5000 mL | Freq: Once | INTRAMUSCULAR | Status: AC
  Administered 2017-07-11: 0.5 mL via INTRAMUSCULAR
  Filled 2017-07-11: qty 0.5

## 2017-07-11 MED ORDER — PHENYLEPHRINE 40 MCG/ML (10ML) SYRINGE FOR IV PUSH (FOR BLOOD PRESSURE SUPPORT)
PREFILLED_SYRINGE | INTRAVENOUS | Status: AC
  Filled 2017-07-11: qty 10

## 2017-07-11 MED ORDER — PROPOFOL 10 MG/ML IV BOLUS
INTRAVENOUS | Status: AC
  Filled 2017-07-11: qty 20

## 2017-07-11 MED ORDER — FENTANYL CITRATE (PF) 100 MCG/2ML IJ SOLN: 25.0000 ug | INTRAMUSCULAR | Status: DC | PRN

## 2017-07-11 SURGICAL SUPPLY — 53 items
BANDAGE ACE 3X5.8 VEL STRL LF (GAUZE/BANDAGES/DRESSINGS) ×3 IMPLANT
BANDAGE ACE 4X5 VEL STRL LF (GAUZE/BANDAGES/DRESSINGS) ×3 IMPLANT
BLADE CLIPPER SURG (BLADE) IMPLANT
BNDG COHESIVE 1X5 TAN STRL LF (GAUZE/BANDAGES/DRESSINGS) ×3 IMPLANT
BNDG CONFORM 2 STRL LF (GAUZE/BANDAGES/DRESSINGS) ×3 IMPLANT
BNDG ESMARK 4X9 LF (GAUZE/BANDAGES/DRESSINGS) ×3 IMPLANT
BNDG GAUZE ELAST 4 BULKY (GAUZE/BANDAGES/DRESSINGS) ×3 IMPLANT
CLOSURE WOUND 1/2 X4 (GAUZE/BANDAGES/DRESSINGS)
CORDS BIPOLAR (ELECTRODE) ×3 IMPLANT
COVER SURGICAL LIGHT HANDLE (MISCELLANEOUS) ×3 IMPLANT
CUFF TOURNIQUET SINGLE 18IN (TOURNIQUET CUFF) ×6 IMPLANT
CUFF TOURNIQUET SINGLE 24IN (TOURNIQUET CUFF) IMPLANT
DRAIN TLS ROUND 10FR (DRAIN) IMPLANT
DRAPE OEC MINIVIEW 54X84 (DRAPES) ×3 IMPLANT
DRAPE SURG 17X11 SM STRL (DRAPES) ×3 IMPLANT
DRSG ADAPTIC 3X8 NADH LF (GAUZE/BANDAGES/DRESSINGS) ×3 IMPLANT
GAUZE SPONGE 4X4 12PLY STRL (GAUZE/BANDAGES/DRESSINGS) ×3 IMPLANT
GAUZE SPONGE 4X4 12PLY STRL LF (GAUZE/BANDAGES/DRESSINGS) ×3 IMPLANT
GAUZE SPONGE 4X4 16PLY XRAY LF (GAUZE/BANDAGES/DRESSINGS) IMPLANT
GAUZE XEROFORM 1X8 LF (GAUZE/BANDAGES/DRESSINGS) ×3 IMPLANT
GLOVE BIOGEL PI IND STRL 8.5 (GLOVE) ×1 IMPLANT
GLOVE BIOGEL PI INDICATOR 8.5 (GLOVE) ×2
GLOVE SURG ORTHO 8.0 STRL STRW (GLOVE) ×3 IMPLANT
GOWN STRL REUS W/ TWL LRG LVL3 (GOWN DISPOSABLE) ×3 IMPLANT
GOWN STRL REUS W/ TWL XL LVL3 (GOWN DISPOSABLE) ×1 IMPLANT
GOWN STRL REUS W/TWL LRG LVL3 (GOWN DISPOSABLE) ×6
GOWN STRL REUS W/TWL XL LVL3 (GOWN DISPOSABLE) ×3
KIT BASIN OR (CUSTOM PROCEDURE TRAY) ×3 IMPLANT
KIT ROOM TURNOVER OR (KITS) ×3 IMPLANT
MANIFOLD NEPTUNE II (INSTRUMENTS) ×3 IMPLANT
NEEDLE HYPO 25X1 1.5 SAFETY (NEEDLE) ×3 IMPLANT
NS IRRIG 1000ML POUR BTL (IV SOLUTION) ×3 IMPLANT
PACK ORTHO EXTREMITY (CUSTOM PROCEDURE TRAY) ×3 IMPLANT
PAD ARMBOARD 7.5X6 YLW CONV (MISCELLANEOUS) ×6 IMPLANT
PAD CAST 4YDX4 CTTN HI CHSV (CAST SUPPLIES) ×1 IMPLANT
PADDING CAST COTTON 4X4 STRL (CAST SUPPLIES) ×3
SOAP 2 % CHG 4 OZ (WOUND CARE) ×3 IMPLANT
SPLINT FINGER (SOFTGOODS) ×3 IMPLANT
STRIP CLOSURE SKIN 1/2X4 (GAUZE/BANDAGES/DRESSINGS) IMPLANT
SUT ETHILON 4 0 PS 2 18 (SUTURE) IMPLANT
SUT MNCRL AB 4-0 PS2 18 (SUTURE) IMPLANT
SUT PROLENE 4 0 PS 2 18 (SUTURE) ×3 IMPLANT
SUT PROLENE 5 0 PS 2 (SUTURE) ×3 IMPLANT
SUT VIC AB 2-0 FS1 27 (SUTURE) IMPLANT
SUT VICRYL 4-0 PS2 18IN ABS (SUTURE) IMPLANT
SYR CONTROL 10ML LL (SYRINGE) ×3 IMPLANT
SYSTEM CHEST DRAIN TLS 7FR (DRAIN) IMPLANT
TOWEL OR 17X24 6PK STRL BLUE (TOWEL DISPOSABLE) ×3 IMPLANT
TOWEL OR 17X26 10 PK STRL BLUE (TOWEL DISPOSABLE) ×3 IMPLANT
TUBE CONNECTING 12'X1/4 (SUCTIONS) ×1
TUBE CONNECTING 12X1/4 (SUCTIONS) ×2 IMPLANT
WATER STERILE IRR 1000ML POUR (IV SOLUTION) ×3 IMPLANT
YANKAUER SUCT BULB TIP NO VENT (SUCTIONS) IMPLANT

## 2017-07-11 NOTE — ED Notes (Signed)
PA-Sophia and Melissa-CN notified of finger injury.

## 2017-07-11 NOTE — ED Notes (Signed)
zithromax not compatible with rocephin.

## 2017-07-11 NOTE — Consult Note (Signed)
Reason for Consult:Right hand injury     Referring Physician: Dr. Gillermina Phy ORBY TANGEN is an 77 y.o. male.  HPI: The patient's finger after it got stuck in the wheelchair while he was in the waiting room.  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  Superficial injuries of the fourth finger. Minimal active bleeding. Pt is on warfarin.  Pt presented to ED with possible pneumonia.   Past Medical History:  Diagnosis Date  . Alcohol abuse   . Arthritis    "just about qwhere now" (08/28/2015)  . Benign prostatic hyperplasia   . CAD (coronary artery disease) 2005   mLAD ~90%, D1/RI-70, mRCA 70% --> CABG 3: LIMA-LAD, SVG-D1/RI, SVG-dRCA  . HTN (hypertension)   . Hyperlipemia   . Iron deficiency anemia   . Myocardial infarction [facility]) 1995   s/p PTCA  . Obesity   . Peripheral vascular disease ([place])   . PONV (postoperative nausea and vomiting)   . Type II diabetes mellitus ([place])     Past Surgical History:  Procedure Laterality Date  . ABDOMINAL HERNIA REPAIR    . CARDIAC CATHETERIZATION  2005   mLAD ~90%, D1/RI-70, mRCA 70%   . CARDIAC CATHETERIZATION N/A 08/29/2015   Procedure: Left Heart Cath and Coronary Angiography;  Surgeon: Leonie Man, MD;  Location: [facility];  Service: Cardiovascular;  Laterality: N/A;  . CAROTID ENDARTERECTOMY    . CORONARY ANGIOPLASTY WITH STENT PLACEMENT  12/1993; 04/1994  . CORONARY ARTERY BYPASS GRAFT  2005   CABG 3: LIMA-LAD, SVG-D1/RI, SVG-dRCA (Dr.VanTrigt)  . EYE SURGERY Left 1968   "dug hot steel out that was imbedded"   . HERNIA REPAIR    . INGUINAL HERNIA REPAIR Right   . KNEE ARTHROSCOPY Left   . KNEE CARTILAGE SURGERY Right 1960s   "took cartilage out"  . LOWER EXTREMITY ANGIOGRAPHY N/A 09/27/2016   Procedure: Lower Extremity Angiography;  Surgeon: Lorretta Harp, MD;  Location: [facility];  Service: Cardiovascular;  Laterality: N/A;  . LOWER EXTREMITY INTERVENTION N/A  11/04/2016   Procedure: Lower Extremity Intervention;  Surgeon: Lorretta Harp, MD;  Location: [facility];  Service: Cardiovascular;  Laterality: N/A;  . SHOULDER ARTHROSCOPY W/ ROTATOR CUFF REPAIR Left   . SKIN CANCER DESTRUCTION     "6 cut off" (08/28/2015)  . UMBILICAL HERNIA REPAIR      Family History  Problem Relation Age of Onset  . Heart attack Mother   . CAD Father     Social History:  reports that he quit smoking about 23 years ago. His smoking use included cigarettes. He has a 52.50 pack-year smoking history. he has never used smokeless tobacco. He reports that he drinks about 16.8 oz of alcohol per week. He reports that he does not use drugs.  Allergies:  Allergies  Allergen Reactions  . 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   . Other Swelling, Rash and Other (See Comments)    Pt states that he is allergic to all -mycins.   Reaction:  Leg swelling   . Statins Other (See Comments)    Reaction:  Muscle pain   . Hydralazine Dermatitis  . Metoprolol Swelling    Edema and redness in legs  . Fish Oil Hives    Pt reports no reaction to fish (only fish oil capsules)  . Norvasc [Amlodipine] Swelling  and Rash    Reaction:  Leg swelling     Medications: I have reviewed the patient's current medications.  Results for orders placed or performed during the hospital encounter of 07/20/2017 (from the past 48 hour(s))  Basic metabolic panel     Status: Abnormal   Collection Time: 07/02/2017  4:40 PM  Result Value Ref Range   Sodium 136 135 - 145 mmol/L   Potassium 4.4 3.5 - 5.1 mmol/L   Chloride 107 101 - 111 mmol/L   CO2 16 (L) 22 - 32 mmol/L   Glucose, Bld 182 (H) 65 - 99 mg/dL   BUN 70 (H) 6 - 20 mg/dL   Creatinine, Ser 7.28 (H) 0.61 - 1.24 mg/dL   Calcium 8.2 (L) 8.9 - 10.3 mg/dL   GFR calc non Af Amer 6 (L) >60 mL/min   GFR calc Af Amer 7 (L) >60 mL/min    Comment: (NOTE) The eGFR has been calculated using  the CKD EPI equation. This calculation has not been validated in all clinical situations. eGFR's persistently <60 mL/min signify possible Chronic Kidney Disease.    Anion gap 13 5 - 15    Comment: Performed at [facility] [address], [place], [place] [address]  CBC     Status: Abnormal   Collection Time: 07/27/2017  4:40 PM  Result Value Ref Range   WBC 16.3 (H) 4.0 - 10.5 K/uL   RBC 2.22 (L) 4.22 - 5.81 MIL/uL   Hemoglobin 6.6 (LL) 13.0 - 17.0 g/dL    Comment: REPEATED TO VERIFY CRITICAL RESULT CALLED TO, READ BACK BY AND VERIFIED WITH: M.SCROGES,RN 07/19/2017 603PM [facility]    HCT 21.2 (L) 39.0 - 52.0 %   MCV 95.5 78.0 - 100.0 fL   MCH 29.7 26.0 - 34.0 pg   MCHC 31.1 30.0 - 36.0 g/dL   RDW 14.4 11.5 - 15.5 %   Platelets 407 (H) 150 - 400 K/uL    Comment: Performed at [facility], [place] [address], [place], [place] [address]  Protime-INR (order if Patient is taking Coumadin / Warfarin)     Status: Abnormal   Collection Time: 07/07/2017  4:40 PM  Result Value Ref Range   Prothrombin Time 32.2 (H) 11.4 - 15.2 seconds   INR 3.16     Comment: Performed at [facility] [address], [place], [place] [address]  Type and screen [place]     Status: None   Collection Time: 07/24/2017  4:46 PM  Result Value Ref Range   ABO/RH(D) B POS    Antibody Screen NEG    Sample Expiration      07/14/2017 Performed at [facility], [place] [address], [place], [place] [address]   I-stat troponin, ED     Status: Abnormal   Collection Time: 07/04/2017  5:12 PM  Result Value Ref Range   Troponin i, poc 0.12 (HH) 0.00 - 0.08 ng/mL   Comment NOTIFIED PHYSICIAN    Comment 3            Comment: Due to the release kinetics of cTnI, a negative result within the first hours of the onset of symptoms does not rule out myocardial infarction with certainty. If myocardial infarction is still suspected, repeat the test at appropriate intervals.   I-Stat CG4 Lactic  Acid, ED  (not at  [facility])     Status: None   Collection Time: 07/01/2017  5:48 PM  Result  Value Ref Range   Lactic Acid, Venous 1.03 0.5 - 1.9 mmol/L  I-Stat venous blood gas, ED     Status: Abnormal   Collection Time: 07/24/2017  5:48 PM  Result Value Ref Range   pH, Ven 7.257 7.250 - 7.430   pCO2, Ven 33.2 (L) 44.0 - 60.0 mmHg   pO2, Ven 22.0 (LL) 32.0 - 45.0 mmHg   Bicarbonate 14.8 (L) 20.0 - 28.0 mmol/L   TCO2 16 (L) 22 - 32 mmol/L   O2 Saturation 30.0 %   Acid-base deficit 11.0 (H) 0.0 - 2.0 mmol/L   Patient temperature HIDE    Sample type VENOUS     Dg Chest 2 View  Result Date: 07/03/2017 CLINICAL DATA:  Hemoptysis which shortness-of-breath and fever. EXAM: CHEST  2 VIEW COMPARISON:  09/23/2016 FINDINGS: Sternotomy wires unchanged. Lungs are adequately inflated demonstrate patchy hazy airspace opacification over the mid to lower lungs likely due to multifocal pneumonia, although hemorrhage could also have this appearance. Mild blunting of the right costophrenic angle unchanged. Mild stable cardiomegaly. Calcified plaque over the aortic arch. Remainder of the exam is unchanged. IMPRESSION: Hazy multifocal airspace process over the mid to lower lungs likely multifocal pneumonia, although hemorrhage could have this appearance. Minimal stable blunting of the right costophrenic angle. Stable cardiomegaly. Electronically Signed   By: Marin Olp M.D.   On: 07/12/2017 12:40   Dg Hand Complete Right  Result Date: 07/24/2017 CLINICAL DATA:  77 year old male with a history injury to hand. Lacerations. EXAM: RIGHT HAND - COMPLETE 3+ VIEW COMPARISON:  None. FINDINGS: Acute fracture of the distal aspect of the distal phalanx fifth digit right hand. Soft tissue disruption on the dorsal aspect of the fourth digit near the distal interphalangeal joint and the proximal interphalangeal joint. No radiopaque foreign body. Soft tissue injury involving the fifth digit of the right hand, with no radiopaque  foreign body. Degenerative changes of the interphalangeal joints. Extensive arterial calcifications of the right hand. IMPRESSION: Acute fracture of the distal phalanx of the fifth digit right hand with associated soft tissue disruption/injury. Evidence of soft tissue injury on the dorsal aspect of the fourth digit, right hand, overlying the DIP and PIP. Extensive calcifications of the arteries of the right hand. Electronically Signed   By: Corrie Mckusick D.O.   On: 07/21/2017 17:08    ROS AS NOTED IN ED RECORDS  Blood pressure (!) 182/70, pulse 74, temperature 98.7 F (37.1 C), resp. rate (!) 22, weight 168 lb (76.2 kg), SpO2 92 %. AWAKE ALERT, SHORT OF BREATH  Cosign Needed    BANDAGE ON FINGERS, FINGER TIPS WARM WELL PERFUSED ABLE TO WIGGLE FINGERS, GOOD MOBILITY OF INDEX/LONG AND THUMB                         Assessment/Plan: RIGHT SMALL FINGER OPEN DISTAL PHALANX FRACTURE, RIGHT RING FINGER LACERATIONS  RIGHT SMALL FINGER AND RING FINGER IRRIGATION AND DEBRIDEMENT AND POSSIBLE PINNING  PLAN FOR URGENT REPAIR TONIGHT WILL THEN BE EVALUATED BY MEDICINE FOR ADMISSION FOR HYPOXIA  R/B/A DISCUSSED WITH PT IN OFFICE.  PT VOICED UNDERSTANDING OF PLAN CONSENT SIGNED DAY OF SURGERY PT SEEN AND EXAMINED PRIOR TO OPERATIVE PROCEDURE/DAY OF SURGERY SITE MARKED. QUESTIONS ANSWERED WILL BE SEEN AND MONITORED BY INTERNAL MEDICINE SERVICE  WE ARE PLANNING SURGERY FOR YOUR UPPER EXTREMITY. THE RISKS AND BENEFITS OF SURGERY INCLUDE BUT NOT LIMITED TO BLEEDING INFECTION, DAMAGE TO NEARBY NERVES ARTERIES TENDONS, FAILURE OF SURGERY TO  ACCOMPLISH ITS INTENDED GOALS, PERSISTENT SYMPTOMS AND NEED FOR FURTHER SURGICAL INTERVENTION. WITH THIS IN MIND WE WILL PROCEED. I HAVE DISCUSSED WITH THE PATIENT THE PRE AND POSTOPERATIVE REGIMEN AND THE DOS AND DON'TS. PT VOICED UNDERSTANDING AND INFORMED CONSENT SIGNED.  Janelle Floor [facility] 07/04/2017, 6:25 PM

## 2017-07-11 NOTE — H&P (Signed)
[name] ZOX:[id number] DOB: 19-Sep-1940 DOA: 14-Jul-2017     PCP: Lupita Raider, MD   Outpatient Specialists: Cardiology H.smith, Nanetta Batty Nephrology Dr. Hyman Hopes Patient coming from:    home Lives  With family    Chief Complaint: Shortness of breath, low hemoglobin and elevated INR  HPI: [name] is a 77 y.o. male with medical history significant of a.fib on coumadin, CAD, chronic diastolic CHF, status post CABG , CK D stage IV, DM 2, para for arterial disease    Presented with one-week history of nasal congestion intermittent fevers and shortness of breath that has ben progressive, as well as epistaxis. Reports only mild cough. He have been coughing up some blood in AM's but that he feels related to his nose bleeds.  Wife reports fever started on 9th up to 102. No burning with urination, no sick contacts, had a flu shot this year.     In January PCP has DC'ed lasix secondary to worsening renal failure patient had baseline has stage IV chronic kidney disease.patient was seen by cardiology was noted to be markedly fluid overloaded  Cardiology has restarted his Lasix in January 2019 and  he has lost 10 pounds initially. Reports that for the past 3 weeks his Lasix was discontinued again he is unsure why. But he continued to loose weight and his swelling have not gotten worse.   Patient reports that his blood pressure has been poorly controlled lately been running in 190-200 range. He was prescribed additional clonidine for this. He have had bad reactions to medications in the past so his cardiologist was trying to adjust it.  Recently have had bleeding from his nose felt that was secondary to sinus infection. This morning he presented to [facility]  ENT for continued epistaxis INR was checked today was 4.3 is been using Afrin multiple times a day  Since it was unsure if patient was coughing up blood or had epistaxis chest x-ray was done which was showing multifocal  infiltrates hemorrhage versus pneumonia  he had elevated white blood cell count of 14 done at PCP office his hemoglobin was noted to be down to 6.6 he was referred to present to emerge department from ENT office.  While awaiting to be seen in the emergency department waiting room patient had his fingers caught in the spokes of wheelchair resulting in partial amputation of right fifth digit.  While in emergency department He was noted to be initially hypoxic down to mid 70s but improved with nasal cannula  Secondary to right hand injury patient was seen in the ER by Dr.Ortmann with hand surgery and was taken for emergent repair. He has undergone open reduction internal fixation of the right fourth metacarpal.   In emergency department noted to have elevated BNP up to 2166 up from baseline of 1,405  A year ago troponin elevated to 0.12 VBG was done 7.257/33.2/  Lactic acid 1.0 Creatinine noted to be elevated to 7.28 from baseline of 5.69 05/17/2017 Potassium stable at 4.4 WBC elevated at 16.3 hemoglobin 6.6 INR 3.16  Reports he have taken all his medications including coumadin today.   Regarding pertinent Chronic problems: History of atrial fibrillation on Coumadin and amiodarone   IN ER:  Temp (24hrs), Avg:98.7 F (37.1 C), Min:98.7 F (37.1 C), Max:98.7 F (37.1 C)      on arrival  ED Triage Vitals  Enc Vitals Group     BP 2017/07/14 1637 (!) 190/57  Pulse Rate July 24, 2017 1637 82     Resp Jul 24, 2017 1637 20     Temp 2017/07/24 1637 98.7 F (37.1 C)     Temp src --      SpO2 07-24-17 1637 (!) 79 %     Weight July 24, 2017 1639 168 lb (76.2 kg)     Height 2017-07-24 2005 5\' 8"  (1.727 m)     Head Circumference --      Peak Flow --      Pain Score 2017-07-24 2011 0     Pain Loc --      Pain Edu? --      Excl. in GC? --     Latest RR 30 O2 91 HR 75 BP 210/77  Following Medications were ordered in ER: Medications  doxycycline (VIBRAMYCIN) 100 mg in sodium chloride 0.9 % 250 mL IVPB (not  administered)  Tdap (BOOSTRIX) injection 0.5 mL (0.5 mLs Intramuscular Given July 24, 2017 1757)  cefTRIAXone (ROCEPHIN) 1 g in sodium chloride 0.9 % 100 mL IVPB (0 g Intravenous Stopped 07-24-17 2002)     ER provider discussed case with: Nephrology Who recommends: holding off lasix for now We'll see patient in consult in the morning    Hospitalist was called for admission for community-acquired pneumonia and acute on chronic renal failure  Review of Systems:    Pertinent positives include: Fevers, chills, fatigue, shortness of breath at rest.  dyspnea on exertion Constitutional:  No weight loss, night sweats, , weight loss  HEENT:  No headaches, Difficulty swallowing,Tooth/dental problems,Sore throat,  No sneezing, itching, ear ache, nasal congestion, post nasal drip,  Cardio-vascular:  No chest pain, Orthopnea, PND, anasarca, dizziness, palpitations.no Bilateral lower extremity swelling  GI:  No heartburn, indigestion, abdominal pain, nausea, vomiting, diarrhea, change in bowel habits, loss of appetite, melena, blood in stool, hematemesis Resp:  no  No, No excess mucus, no productive cough, No non-productive cough, No coughing up of blood.No change in color of mucus.No wheezing. Skin:  no rash or lesions. No jaundice GU:  no dysuria, change in color of urine, no urgency or frequency. No straining to urinate.  No flank pain.  Musculoskeletal:  No joint pain or no joint swelling. No decreased range of motion. No back pain.  Psych:  No change in mood or affect. No depression or anxiety. No memory loss.  Neuro: no localizing neurological complaints, no tingling, no weakness, no double vision, no gait abnormality, no slurred speech, no confusion  As per HPI otherwise 10 point review of systems negative.   Past Medical History: Past Medical History:  Diagnosis Date  . Alcohol abuse   . Arthritis    "just about qwhere now" (08/28/2015)  . Benign prostatic hyperplasia   . CAD  (coronary artery disease) 2005   mLAD ~90%, D1/RI-70, mRCA 70% --> CABG 3: LIMA-LAD, SVG-D1/RI, SVG-dRCA  . HTN (hypertension)   . Hyperlipemia   . Iron deficiency anemia   . Myocardial infarction [facility]) 1995   s/p PTCA  . Obesity   . Peripheral vascular disease (HCC)   . PONV (postoperative nausea and vomiting)   . Type II diabetes mellitus (HCC)    Past Surgical History:  Procedure Laterality Date  . ABDOMINAL HERNIA REPAIR    . CARDIAC CATHETERIZATION  2005   mLAD ~90%, D1/RI-70, mRCA 70%   . CARDIAC CATHETERIZATION N/A 08/29/2015   Procedure: Left Heart Cath and Coronary Angiography;  Surgeon: Marykay Lex, MD;  Location: [facility] INVASIVE CV LAB;  Service: Cardiovascular;  Laterality: N/A;  . CAROTID ENDARTERECTOMY    . CORONARY ANGIOPLASTY WITH STENT PLACEMENT  12/1993; 04/1994  . CORONARY ARTERY BYPASS GRAFT  2005   CABG 3: LIMA-LAD, SVG-D1/RI, SVG-dRCA (Dr.VanTrigt)  . EYE SURGERY Left 1968   "dug hot steel out that was imbedded"   . HERNIA REPAIR    . INGUINAL HERNIA REPAIR Right   . KNEE ARTHROSCOPY Left   . KNEE CARTILAGE SURGERY Right 1960s   "took cartilage out"  . LOWER EXTREMITY ANGIOGRAPHY N/A 09/27/2016   Procedure: Lower Extremity Angiography;  Surgeon: Runell Gess, MD;  Location: [facility] INVASIVE CV LAB;  Service: Cardiovascular;  Laterality: N/A;  . LOWER EXTREMITY INTERVENTION N/A 11/04/2016   Procedure: Lower Extremity Intervention;  Surgeon: Runell Gess, MD;  Location: [facility] INVASIVE CV LAB;  Service: Cardiovascular;  Laterality: N/A;  . SHOULDER ARTHROSCOPY W/ ROTATOR CUFF REPAIR Left   . SKIN CANCER DESTRUCTION     "6 cut off" (08/28/2015)  . UMBILICAL HERNIA REPAIR       Social History:  Ambulatory   independently    reports that he quit smoking about 23 years ago. His smoking use included cigarettes. He has a 52.50 pack-year smoking history. he has never used smokeless tobacco. He reports that he drinks about 16.8 oz of alcohol per week. He reports  that he does not use drugs.  Allergies:   Allergies  Allergen Reactions  . 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   . Other Swelling, Rash and Other (See Comments)    Pt states that he is allergic to all -mycins.   Reaction:  Leg swelling   . Statins Other (See Comments)    Reaction:  Muscle pain   . Hydralazine Dermatitis  . Metoprolol Swelling    Edema and redness in legs  . Fish Oil Hives    Pt reports no reaction to fish (only fish oil capsules)  . Norvasc [Amlodipine] Swelling and Rash    Reaction:  Leg swelling        Family History:   Family History  Problem Relation Age of Onset  . Heart attack Mother   . CAD Father     Medications: Prior to Admission medications   Medication Sig Start Date End Date Taking? Authorizing Provider  acetaminophen (TYLENOL) 325 MG tablet Take 2 tablets (650 mg total) by mouth every 4 (four) hours as needed for headache or mild pain. 09/28/16   Abelino Derrick, PA-C  amiodarone (PACERONE) 200 MG tablet Take 1 tablet (200 mg total) by mouth daily. 05/18/17   Lyn Records, MD  aspirin EC 81 MG tablet Take 81 mg by mouth at bedtime.     [provider]  Bacitracin-Polymyxin B (POLYSPORIN EX) Apply 1 application topically 2 (two) times daily as needed (wound care).     [provider]  cloNIDine (CATAPRES - DOSED IN MG/24 HR) 0.1 mg/24hr patch Place 0.1 mg onto the skin once a week.    [provider]  Coenzyme Q10 (CO Q-10) 100 MG CAPS Take 100 mg by mouth daily.    [provider]  docusate sodium (COLACE) 50 MG capsule Take 1 capsule (50 mg total) by mouth 2 (two) times daily. 01/20/17 01/15/18  Leone Brand, NP  ezetimibe (ZETIA) 10 MG tablet Take 5 mg by mouth daily.    [provider]  ferrous sulfate 325 (65 FE) MG tablet Take  325 mg by mouth at bedtime.     [provider]  furosemide (LASIX) 80 MG tablet Take 1 tablet (80 mg  total) by mouth daily. 05/19/17 05/14/18  Lyn Records, MD  ibuprofen (ADVIL,MOTRIN) 200 MG tablet Take 400 mg by mouth 2 (two) times daily as needed for mild pain.    [provider]  nitroGLYCERIN (NITROSTAT) 0.4 MG SL tablet Place 0.4 mg under the tongue every 5 (five) minutes as needed for chest pain.    [provider]  Oxymetazoline HCl (NASAL SPRAY NA) Place 1 spray into the nose 2 (two) times daily as needed (nasal congestion).    [provider]  polyvinyl alcohol (ARTIFICIAL TEARS) 1.4 % ophthalmic solution Place 1 drop into both eyes 2 (two) times daily as needed for dry eyes.    [provider]  warfarin (COUMADIN) 2.5 MG tablet TAKE AS DIRECTED BY COUMADIN CLINIC 05/30/17   Lyn Records, MD    Physical Exam: Patient Vitals for the past 24 hrs:  BP Temp Pulse Resp SpO2 Height Weight  07-29-2017 2030 (!) 199/75 - 72 (!) 30 91 % - -  2017/07/29 2005 - - - - - 5\' 8"  (1.727 m) 76.2 kg (168 lb)  07-29-2017 2000 (!) 203/76 - 73 (!) 27 92 % - -  July 29, 2017 1955 (!) 209/79 - - - - - -  07-29-2017 1815 (!) 182/70 - 74 (!) 22 92 % - -  2017/07/29 1800 - - 73 (!) 21 99 % - -  July 29, 2017 1715 (!) 189/67 - 73 (!) 23 100 % - -  2017/07/29 1700 - - 76 - 100 % - -  2017/07/29 1639 - - - - - - 76.2 kg (168 lb)  2017-07-29 1637 (!) 190/57 98.7 F (37.1 C) 82 20 (!) 79 % - -    1. General:  in No Acute distress   Chronically ill  -appearing 2. Psychological: Alert and  Oriented 3. Head/ENT:    Dry Mucous Membranes                          Head Non traumatic, neck supple                           Poor Dentition 4. SKIN:  decreased Skin turgor,  Skin clean Dry and intact no rash, right hand with dressing applied 5. Heart: Regular rate and rhythm no  Murmur, no Rub or gallop 6. Lungs:  no wheezes fine bilateral crackles   7. Abdomen: Soft,  non-tender,   distended  obese   8. Lower extremities: no clubbing, cyanosis,1+ edema 9. Neurologically Grossly intact, moving all 4  extremities equally  10. MSK: Normal range of motion   body mass index is 25.54 kg/m.  Labs on Admission:   Labs on Admission: I have personally reviewed following labs and imaging studies  CBC: Recent Labs  Lab July 29, 2017 1640  WBC 16.3*  HGB 6.6*  HCT 21.2*  MCV 95.5  PLT 407*   Basic Metabolic Panel: Recent Labs  Lab 2017/07/29 1640  NA 136  K 4.4  CL 107  CO2 16*  GLUCOSE 182*  BUN 70*  CREATININE 7.28*  CALCIUM 8.2*   GFR: Estimated Creatinine Clearance: 8.2 mL/min (A) (by C-G formula based on SCr of 7.28 mg/dL (H)). Liver Function Tests: No results for input(s): AST, ALT, ALKPHOS, BILITOT, PROT, ALBUMIN in the  last 168 hours. No results for input(s): LIPASE, AMYLASE in the last 168 hours. No results for input(s): AMMONIA in the last 168 hours. Coagulation Profile: Recent Labs  Lab 23-Jul-2017 1640  INR 3.16   Cardiac Enzymes: No results for input(s): CKTOTAL, CKMB, CKMBINDEX, TROPONINI in the last 168 hours. BNP (last 3 results) Recent Labs    05/18/17 1530  PROBNP 53,271*   HbA1C: No results for input(s): HGBA1C in the last 72 hours. CBG: No results for input(s): GLUCAP in the last 168 hours. Lipid Profile: No results for input(s): CHOL, HDL, LDLCALC, TRIG, CHOLHDL, LDLDIRECT in the last 72 hours. Thyroid Function Tests: No results for input(s): TSH, T4TOTAL, FREET4, T3FREE, THYROIDAB in the last 72 hours. Anemia Panel: No results for input(s): VITAMINB12, FOLATE, FERRITIN, TIBC, IRON, RETICCTPCT in the last 72 hours. Urine analysis:   Sepsis Labs: @LABRCNTIP (procalcitonin:4,lacticidven:4) )No results found for this or any previous visit (from the past 240 hour(s)).    UA   ordered  No results found for: HGBA1C  Estimated Creatinine Clearance: 8.2 mL/min (A) (by C-G formula based on SCr of 7.28 mg/dL (H)).  BNP (last 3 results) Recent Labs    05/18/17 1530  PROBNP 53,271*     ECG REPORT  Independently reviewed Rate: 81  Rhythm:  NSR ST&T Change: ST depressions in V5V6  QTC 494  Filed Weights   07/23/17 1639 07/23/17 2005  Weight: 76.2 kg (168 lb) 76.2 kg (168 lb)     Cultures: No results found for: SDES, SPECREQUEST, CULT, REPTSTATUS   Radiological Exams on Admission: Dg Chest 2 View  Result Date: July 23, 2017 CLINICAL DATA:  Hemoptysis which shortness-of-breath and fever. EXAM: CHEST  2 VIEW COMPARISON:  09/23/2016 FINDINGS: Sternotomy wires unchanged. Lungs are adequately inflated demonstrate patchy hazy airspace opacification over the mid to lower lungs likely due to multifocal pneumonia, although hemorrhage could also have this appearance. Mild blunting of the right costophrenic angle unchanged. Mild stable cardiomegaly. Calcified plaque over the aortic arch. Remainder of the exam is unchanged. IMPRESSION: Hazy multifocal airspace process over the mid to lower lungs likely multifocal pneumonia, although hemorrhage could have this appearance. Minimal stable blunting of the right costophrenic angle. Stable cardiomegaly. Electronically Signed   By: Elberta Fortis M.D.   On: 07/23/17 12:40   Dg Hand Complete Right  Result Date: 23-Jul-2017 CLINICAL DATA:  77 year old male with a history injury to hand. Lacerations. EXAM: RIGHT HAND - COMPLETE 3+ VIEW COMPARISON:  None. FINDINGS: Acute fracture of the distal aspect of the distal phalanx fifth digit right hand. Soft tissue disruption on the dorsal aspect of the fourth digit near the distal interphalangeal joint and the proximal interphalangeal joint. No radiopaque foreign body. Soft tissue injury involving the fifth digit of the right hand, with no radiopaque foreign body. Degenerative changes of the interphalangeal joints. Extensive arterial calcifications of the right hand. IMPRESSION: Acute fracture of the distal phalanx of the fifth digit right hand with associated soft tissue disruption/injury. Evidence of soft tissue injury on the dorsal aspect of the fourth digit,  right hand, overlying the DIP and PIP. Extensive calcifications of the arteries of the right hand. Electronically Signed   By: Gilmer Mor D.O.   On: 07-23-17 17:08    Chart has been reviewed    Assessment/Plan  77 y.o. male with medical history significant of a.fib on coumadin, CAD, chronic diastolic CHF, status post CABG , CK D stage IV, DM 2, para for arterial disease   Admitted for community-acquired  pneumonia and acute on chronic renal failure and elevate troponin.    Present on Admission: Acute on chronic respiratory failure with hypoxia likely secondary to pneumonia improved work of breathing with pain management with morphine. Ordered BiPAP as needed admit to step down discussed with pulmonology who will help manage through e-link at night, unsure if respiratory status could be partially secondary to fluid overload if worsens would consider diuresis. . CKD (chronic kidney disease), stage IV (HCC) acute on chronic failure discuss with nephrology for not they recommended holding off on diuresis. They will see patient in a.m.  we will need careful titration of medication given history of diastolic CHF as well as worsening renal function . Essential hypertension continue current home medications blood pressure control improved with pain management . Hyperlipidemia stable restart home medications when able . Type 2 diabetes mellitus with vascular disease (HCC) - SSI ordered check hemoglobin A1c . Chronic diastolic CHF (congestive heart failure) (HCC) - for now continue to hold Lasix we'll need to discuss in a.m. cardiology and nephrology to optimal management long-term . Anemia - anemia panel, we'll transfuse  1 unit, Hemoccult-positive the patient have had nose bleeding. May benefit from additional GI workup . CAP (community acquired pneumonia) - , Rocephin and azithromycin Await results of sputum and blood cultures . Epistaxis - currently stable hold Coumadin Elevated troponin -  continue to cycle, we'll order echogram,  email cardiology  . Atrial fibrillation (HCC) -           - CHA2DS2 vas score 5 : hold Coumadin due to elevated INR             - Rhythm control: Continue amiodarone  Other plan as per orders.  DVT prophylaxis:  SCD    Code Status:    DNR/DNI as per patient    Family Communication:   Family at bed side Wife  Disposition Plan:   To home once workup is complete and patient is stable                                                  Consults called: nephrology. Hand Surgery, emailed cardiology, discussed with pulmonology  Admission status:    inpatient      Level of care     SDU      I have spent a total of 76 min on this admission extra time was spent to discuss case with consultants  Jahzier Villalon 07/21/2017, 3:31 AM    [facility]  Pager [phone]   after 2 AM please page floor coverage PA If 7AM-7PM, please contact the day team taking care of the patient  [email or web address]  Password [email or web address]

## 2017-07-11 NOTE — ED Notes (Addendum)
Pt on 10L high flow [place], sats in the 80's.  Placed pt on non-rebreather.  Pt vomiting into emesis bag.  Wife at bedside. BP remains in 200's systolic. Reported to Dr. Adela Glimpse face to face.

## 2017-07-11 NOTE — ED Notes (Signed)
Pt placed on pulse ox oxygen at 72%, pt denies SOB placed on nasal cannula and then non-rebreather, O2 now 92%

## 2017-07-11 NOTE — Telephone Encounter (Signed)
Spoke with pt and made him aware of Dr. Michaelle Copas recommendations.  Pt has CVRR appt tomorrow.  Pt going to see PCP this AM d/t bleeding still occurring and he feels like he may have a bad sinus infection.  Advised pt to keep PCP and CVRR appt for tomorrow.  Advised if PCP checks INR to let the CVRR clinic know.  Pt verbalized understanding and was appreciative for call.

## 2017-07-11 NOTE — ED Notes (Signed)
RN attempted to start 2nd IV x2 without success.

## 2017-07-11 NOTE — ED Provider Notes (Signed)
I was asked to evaluate the patient's finger after it got stuck in the wheelchair while he was in the waiting room.  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  Superficial injuries of the fourth finger. Minimal active bleeding. Pt is on warfarin. Pictures below.   Xray ordered and dressing to be applied.            Alveria Apley, PA-C 07/28/2017 1639    Margarita Grizzle, MD 07/12/17 [phone]

## 2017-07-11 NOTE — ED Provider Notes (Signed)
[facility] Provider Note   CSN: [id number] Arrival date & time: 07/31/2017  1622     History   Chief Complaint Chief Complaint  Patient presents with  . Shortness of Breath  . Finger Injury    HPI [name] is a 77 y.o. male.  HPI  77 y/o male arrives via EMS for SOB/dyspnea new hypoxia 70's in the field responsive to [place]. The pt c/o of URI sx's x 4d dx with PNA per PCP. While in the hospital waiting the room the patient attempted to use a wheel chair catching his R hand in the wheel with subsequent injury.    Past Medical History:  Diagnosis Date  . Alcohol abuse   . Arthritis    "just about qwhere now" (08/28/2015)  . Benign prostatic hyperplasia   . CAD (coronary artery disease) 2005   mLAD ~90%, D1/RI-70, mRCA 70% --> CABG 3: LIMA-LAD, SVG-D1/RI, SVG-dRCA  . HTN (hypertension)   . Hyperlipemia   . Iron deficiency anemia   . Myocardial infarction [facility]) 1995   s/p PTCA  . Obesity   . Peripheral vascular disease (HCC)   . PONV (postoperative nausea and vomiting)   . Type II diabetes mellitus [facility])     Patient Active Problem List   Diagnosis Date Noted  . Chronic diastolic CHF (congestive heart failure) (HCC) 07-31-17  . Anemia 2017-07-31  . CAP (community acquired pneumonia) Jul 31, 2017  . Epistaxis 2017/07/31  . Acute on chronic diastolic heart failure (HCC) 06/10/2017  . Encounter for therapeutic drug monitoring 05/02/2017  . Atrial fibrillation (HCC) 04/27/2017  . PAD (peripheral artery disease) (HCC)   . Bilateral claudication of lower limb (HCC) 02/24/2016  . Pain in the chest   . Type 2 diabetes mellitus with vascular disease (HCC) 08/28/2015  . Hyponatremia 08/28/2015  . CKD (chronic kidney disease), stage IV (HCC) 09/27/2014  . Essential hypertension 09/26/2013  . Hyperlipidemia 09/26/2013  . Hx of CABG 09/26/2013    Past Surgical History:  Procedure Laterality Date  . ABDOMINAL HERNIA REPAIR    . CARDIAC  CATHETERIZATION  2005   mLAD ~90%, D1/RI-70, mRCA 70%   . CARDIAC CATHETERIZATION N/A 08/29/2015   Procedure: Left Heart Cath and Coronary Angiography;  Surgeon: Marykay Lex, MD;  Location: [facility] INVASIVE CV LAB;  Service: Cardiovascular;  Laterality: N/A;  . CAROTID ENDARTERECTOMY    . CORONARY ANGIOPLASTY WITH STENT PLACEMENT  12/1993; 04/1994  . CORONARY ARTERY BYPASS GRAFT  2005   CABG 3: LIMA-LAD, SVG-D1/RI, SVG-dRCA (Dr.VanTrigt)  . EYE SURGERY Left 1968   "dug hot steel out that was imbedded"   . HERNIA REPAIR    . INGUINAL HERNIA REPAIR Right   . KNEE ARTHROSCOPY Left   . KNEE CARTILAGE SURGERY Right 1960s   "took cartilage out"  . LOWER EXTREMITY ANGIOGRAPHY N/A 09/27/2016   Procedure: Lower Extremity Angiography;  Surgeon: Runell Gess, MD;  Location: [facility] INVASIVE CV LAB;  Service: Cardiovascular;  Laterality: N/A;  . LOWER EXTREMITY INTERVENTION N/A 11/04/2016   Procedure: Lower Extremity Intervention;  Surgeon: Runell Gess, MD;  Location: [facility] INVASIVE CV LAB;  Service: Cardiovascular;  Laterality: N/A;  . SHOULDER ARTHROSCOPY W/ ROTATOR CUFF REPAIR Left   . SKIN CANCER DESTRUCTION     "6 cut off" (08/28/2015)  . UMBILICAL HERNIA REPAIR         Home Medications    Prior to Admission medications   Medication Sig Start Date End Date Taking?  Authorizing Provider  acetaminophen (TYLENOL) 325 MG tablet Take 2 tablets (650 mg total) by mouth every 4 (four) hours as needed for headache or mild pain. 09/28/16  Yes Kilroy, Eda Paschal, PA-C  amiodarone (PACERONE) 200 MG tablet Take 1 tablet (200 mg total) by mouth daily. 05/18/17  Yes Lyn Records, MD  aspirin EC 81 MG tablet Take 81 mg by mouth at bedtime.    Yes [provider]  cloNIDine (CATAPRES - DOSED IN MG/24 HR) 0.1 mg/24hr patch Place 0.1 mg onto the skin once a week. On Wednesday   Yes [provider]  Coenzyme Q10 (CO Q-10) 100 MG CAPS Take 100 mg by mouth daily.   Yes [provider]    ezetimibe (ZETIA) 10 MG tablet Take 5 mg by mouth daily.   Yes [provider]  ferrous sulfate 325 (65 FE) MG tablet Take 325 mg by mouth at bedtime.    Yes [provider]  ibuprofen (ADVIL,MOTRIN) 200 MG tablet Take 400 mg by mouth 2 (two) times daily as needed for mild pain.   Yes [provider]  nitroGLYCERIN (NITROSTAT) 0.4 MG SL tablet Place 0.4 mg under the tongue every 5 (five) minutes as needed for chest pain.   Yes [provider]  warfarin (COUMADIN) 2.5 MG tablet Take 1.25-2.5 mg by mouth See admin instructions. Take 1/2 tablet on Tuesday then take 1 tablet all the other days   Yes [provider]  docusate sodium (COLACE) 50 MG capsule Take 1 capsule (50 mg total) by mouth 2 (two) times daily. Patient not taking: Reported on 24-Jul-2017 01/20/17 01/15/18  Leone Brand, NP  furosemide (LASIX) 80 MG tablet Take 1 tablet (80 mg total) by mouth daily. Patient not taking: Reported on 24-Jul-2017 05/19/17 05/14/18  Lyn Records, MD  warfarin (COUMADIN) 2.5 MG tablet TAKE AS DIRECTED BY COUMADIN CLINIC Patient not taking: Reported on 07-24-2017 05/30/17   Lyn Records, MD    Family History Family History  Problem Relation Age of Onset  . Heart attack Mother   . CAD Father     Social History Social History   Tobacco Use  . Smoking status: Former Smoker    Packs/day: 1.50    Years: 35.00    Pack years: 52.50    Types: Cigarettes    Last attempt to quit: 01/08/1994    Years since quitting: 23.5  . Smokeless tobacco: Never Used  Substance Use Topics  . Alcohol use: Yes    Alcohol/week: 16.8 oz    Types: 28 Cans of beer per week    Comment: 2 beers everyday.  . Drug use: No     Allergies   Food; Other; Statins; Hydralazine; Metoprolol; Fish oil; and Norvasc [amlodipine]   Review of Systems Review of Systems  Constitutional: Negative for chills and fever.  HENT: Negative for ear pain and sore throat.   Eyes: Negative for  pain and visual disturbance.  Respiratory: Positive for cough and shortness of breath.   Cardiovascular: Negative for chest pain and palpitations.  Gastrointestinal: Negative for abdominal pain and vomiting.  Genitourinary: Negative for dysuria and hematuria.  Musculoskeletal: Positive for joint swelling (R hand small finger). Negative for arthralgias and back pain.  Skin: Positive for wound (R hand ). Negative for color change and rash.  Neurological: Negative for seizures and syncope.  All other systems reviewed and are negative.    Physical Exam Updated Vital Signs BP (!) 157/67  Pulse 68   Temp 98.9 F (37.2 C) (Oral)   Resp 17   Ht 5\' 8"  (1.727 m)   Wt 75.7 kg (166 lb 14.2 oz)   SpO2 96%   BMI 25.38 kg/m   Physical Exam  Constitutional: He appears well-developed and well-nourished.  HENT:  Head: Normocephalic and atraumatic.  Mouth/Throat: Oropharynx is clear and moist.  Eyes: Conjunctivae are normal.  Neck: Neck supple.  Cardiovascular: Normal rate and regular rhythm.  No murmur heard. Pulmonary/Chest: Effort normal. Tachypnea noted. No respiratory distress. He has wheezes.  Abdominal: Soft. There is no tenderness.  Musculoskeletal: He exhibits no edema.  Neurological: He is alert.  Skin: Skin is warm and dry. Laceration (partial amputation/nail avulsion R hand small finger) noted.  Psychiatric: He has a normal mood and affect.  Nursing note and vitals reviewed.        ED Treatments / Results  Labs (all labs ordered are listed, but only abnormal results are displayed) Labs Reviewed  BASIC METABOLIC PANEL - Abnormal; Notable for the following components:      Result Value   CO2 16 (*)    Glucose, Bld 182 (*)    BUN 70 (*)    Creatinine, Ser 7.28 (*)    Calcium 8.2 (*)    GFR calc non Af Amer 6 (*)    GFR calc Af Amer 7 (*)    All other components within normal limits  CBC - Abnormal; Notable for the following components:   WBC 16.3 (*)    RBC 2.22  (*)    Hemoglobin 6.6 (*)    HCT 21.2 (*)    Platelets 407 (*)    All other components within normal limits  PROTIME-INR - Abnormal; Notable for the following components:   Prothrombin Time 32.2 (*)    All other components within normal limits  URINALYSIS, ROUTINE W REFLEX MICROSCOPIC - Abnormal; Notable for the following components:   Glucose, UA 150 (*)    Hgb urine dipstick SMALL (*)    Protein, ur 100 (*)    All other components within normal limits  BRAIN NATRIURETIC PEPTIDE - Abnormal; Notable for the following components:   B Natriuretic Peptide 2,166.2 (*)    All other components within normal limits  TROPONIN I - Abnormal; Notable for the following components:   Troponin I 0.11 (*)    All other components within normal limits  VITAMIN B12 - Abnormal; Notable for the following components:   Vitamin B-12 <50 (*)    All other components within normal limits  IRON AND TIBC - Abnormal; Notable for the following components:   Iron 21 (*)    TIBC 234 (*)    Saturation Ratios 9 (*)    All other components within normal limits  FERRITIN - Abnormal; Notable for the following components:   Ferritin 770 (*)    All other components within normal limits  RETICULOCYTES - Abnormal; Notable for the following components:   Retic Ct Pct 4.6 (*)    RBC. 2.11 (*)    All other components within normal limits  GLUCOSE, CAPILLARY - Abnormal; Notable for the following components:   Glucose-Capillary 154 (*)    All other components within normal limits  I-STAT TROPONIN, ED - Abnormal; Notable for the following components:   Troponin i, poc 0.12 (*)    All other components within normal limits  I-STAT VENOUS BLOOD GAS, ED - Abnormal; Notable for the following components:   pCO2, Ven 33.2 (*)  pO2, Ven 22.0 (*)    Bicarbonate 14.8 (*)    TCO2 16 (*)    Acid-base deficit 11.0 (*)    All other components within normal limits  POC OCCULT BLOOD, ED - Abnormal; Notable for the following  components:   Fecal Occult Bld POSITIVE (*)    All other components within normal limits  CULTURE, BLOOD (ROUTINE X 2)  CULTURE, BLOOD (ROUTINE X 2)  RESPIRATORY PANEL BY PCR  URINE CULTURE  CULTURE, EXPECTORATED SPUTUM-ASSESSMENT  GRAM STAIN  MRSA PCR SCREENING  FOLATE  TROPONIN I  TROPONIN I  INFLUENZA PANEL BY PCR (TYPE A & B)  MAGNESIUM  PHOSPHORUS  TSH  COMPREHENSIVE METABOLIC PANEL  CBC  PROTIME-INR  STREP PNEUMONIAE URINARY ANTIGEN  LEGIONELLA PNEUMOPHILA SEROGP 1 UR AG  HEMOGLOBIN A1C  I-STAT CG4 LACTIC ACID, ED  I-STAT TROPONIN, ED  I-STAT CG4 LACTIC ACID, ED  TYPE AND SCREEN  ABO/RH  PREPARE RBC (CROSSMATCH)    EKG  EKG Interpretation  Date/Time:  Monday 26-Jul-2017 16:36:30 EST Ventricular Rate:  81 PR Interval:  172 QRS Duration: 114 QT Interval:  426 QTC Calculation: 494 R Axis:   76 Text Interpretation:  Normal sinus rhythm Nonspecific ST abnormality Abnormal QRS-T angle, consider primary T wave abnormality Prolonged QT Nonspecific ST abnormality Confirmed by Cathren Laine ([id number]) on 07/26/17 5:24:03 PM       Radiology Dg Chest 2 View  Result Date: 2017-07-26 CLINICAL DATA:  Hemoptysis which shortness-of-breath and fever. EXAM: CHEST  2 VIEW COMPARISON:  09/23/2016 FINDINGS: Sternotomy wires unchanged. Lungs are adequately inflated demonstrate patchy hazy airspace opacification over the mid to lower lungs likely due to multifocal pneumonia, although hemorrhage could also have this appearance. Mild blunting of the right costophrenic angle unchanged. Mild stable cardiomegaly. Calcified plaque over the aortic arch. Remainder of the exam is unchanged. IMPRESSION: Hazy multifocal airspace process over the mid to lower lungs likely multifocal pneumonia, although hemorrhage could have this appearance. Minimal stable blunting of the right costophrenic angle. Stable cardiomegaly. Electronically Signed   By: Elberta Fortis M.D.   On: 07-26-17 12:40   Dg  Chest Port 1 View  Result Date: 07-26-2017 CLINICAL DATA:  Acute onset of hypoxia.  Evaluate for aspiration. EXAM: PORTABLE CHEST 1 VIEW COMPARISON:  Chest radiograph performed earlier today at 12:44 p.m. FINDINGS: There is significantly worsening bilateral airspace opacification. A small right pleural effusion is noted. This may reflect diffuse pneumonia or pulmonary edema. No pneumothorax is seen. The mediastinal silhouette is enlarged. The patient is status post median sternotomy, with evidence of prior CABG. No acute osseous abnormalities are seen. IMPRESSION: 1. Significantly worsening bilateral airspace opacification; this may reflect diffuse pneumonia or pulmonary edema. Small right pleural effusion noted. 2. Cardiomegaly. Electronically Signed   By: Roanna Raider M.D.   On: Jul 26, 2017 23:05   Dg Hand Complete Right  Result Date: 07/26/2017 CLINICAL DATA:  77 year old male with a history injury to hand. Lacerations. EXAM: RIGHT HAND - COMPLETE 3+ VIEW COMPARISON:  None. FINDINGS: Acute fracture of the distal aspect of the distal phalanx fifth digit right hand. Soft tissue disruption on the dorsal aspect of the fourth digit near the distal interphalangeal joint and the proximal interphalangeal joint. No radiopaque foreign body. Soft tissue injury involving the fifth digit of the right hand, with no radiopaque foreign body. Degenerative changes of the interphalangeal joints. Extensive arterial calcifications of the right hand. IMPRESSION: Acute fracture of the distal phalanx of the fifth digit right  hand with associated soft tissue disruption/injury. Evidence of soft tissue injury on the dorsal aspect of the fourth digit, right hand, overlying the DIP and PIP. Extensive calcifications of the arteries of the right hand. Electronically Signed   By: Gilmer Mor D.O.   On: July 19, 2017 17:08    Procedures Procedures (including critical care time)  Medications Ordered in ED Medications  doxycycline  (VIBRAMYCIN) 100 mg in sodium chloride 0.9 % 250 mL IVPB (0 mg Intravenous Stopped 07-19-17 2321)  insulin aspart (novoLOG) injection 0-5 Units (0 Units Subcutaneous Not Given 07/12/17 0139)  cefTRIAXone (ROCEPHIN) 1 g in sodium chloride 0.9 % 100 mL IVPB (not administered)  azithromycin (ZITHROMAX) 500 mg in sodium chloride 0.9 % 250 mL IVPB (500 mg Intravenous New Bag/Given 07/12/17 0139)  insulin aspart (novoLOG) injection 0-9 Units (not administered)  amiodarone (PACERONE) tablet 200 mg (not administered)  cloNIDine (CATAPRES - Dosed in mg/24 hr) patch 0.1 mg (not administered)  ezetimibe (ZETIA) tablet 5 mg (not administered)  acetaminophen (TYLENOL) tablet 650 mg (not administered)    Or  acetaminophen (TYLENOL) suppository 650 mg (not administered)  HYDROcodone-acetaminophen (NORCO/VICODIN) 5-325 MG per tablet 1-2 tablet (not administered)  ondansetron (ZOFRAN) tablet 4 mg (not administered)    Or  ondansetron (ZOFRAN) injection 4 mg (not administered)  sodium chloride flush (NS) 0.9 % injection 3 mL (3 mLs Intravenous Given 07/12/17 0142)  sodium chloride flush (NS) 0.9 % injection 3 mL (not administered)  0.9 %  sodium chloride infusion (not administered)  levalbuterol (XOPENEX) nebulizer solution 0.63 mg (not administered)  guaiFENesin (MUCINEX) 12 hr tablet 600 mg (600 mg Oral Given 07/12/17 0133)  vancomycin (VANCOCIN) 1,500 mg in sodium chloride 0.9 % 500 mL IVPB (1,500 mg Intravenous Not Given 07/12/17 0123)  Tdap (BOOSTRIX) injection 0.5 mL (0.5 mLs Intramuscular Given 07-19-17 1757)  cefTRIAXone (ROCEPHIN) 1 g in sodium chloride 0.9 % 100 mL IVPB (0 g Intravenous Stopped July 19, 2017 2002)  morphine 4 MG/ML injection 2 mg (2 mg Intravenous Given Jul 19, 2017 2344)  0.9 %  sodium chloride infusion ( Intravenous New Bag/Given 07/12/17 0135)  acetaminophen (TYLENOL) tablet 650 mg (650 mg Oral Given 07/12/17 0159)  diphenhydrAMINE (BENADRYL) capsule 25 mg (25 mg Oral Given 07/12/17 0158)      Initial Impression / Assessment and Plan / ED Course  I have reviewed the triage vital signs and the nursing notes.  Pertinent labs & imaging results that were available during my care of the patient were reviewed by me and considered in my medical decision making (see chart for details).     77 y/o male arrives via EMS for SOB/dyspnea new hypoxia 70's in the field responsive to [place]. The pt c/o of URI sx's x 4d dx with PNA per PCP. While in the hospital waiting the room the patient attempted to use a wheel chair catching his R hand in the wheel with subsequent injury.  Pt arrived hypoxic responded well  4L [place] >95% sats.  On initial eval adventitious breath sounds.  Pt  Blood cx drawn.  Started on azith/ctx.  Trauma hand consult placed with plan for move to OR for repair of soft tissue injury with associated 5th digit distal phalynx fx/partial amputation. .  Surgeon made aware of pt current new hypoxia/pulmonary infection.  Hospitalist consulted for admit to their service after procedure for multifocal PNA.  Some labs were not collected prior to pt mvt to OR. Pt stable for OR.   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

## 2017-07-11 NOTE — ED Triage Notes (Signed)
Pt presents to the ed from his doctors office with positive pneumonia, low hgb and elevated pt/inr.  Pt is in no apparent distress in triage. While getting in the wheelchair the patients finger got caught and the patient has a laceration on his right fifth finger that is almost all the way through.  Bleeding controlled at this time.  Pt has small abrasions on his right hand as well.

## 2017-07-11 NOTE — ED Notes (Signed)
I stat troponin results given to Dr. Denton Lank by B. Bing Plume, EMT

## 2017-07-11 NOTE — ED Notes (Signed)
Dressing applied to R hand 4th and 5th finger

## 2017-07-11 NOTE — ED Notes (Signed)
Admitting still at bedside 

## 2017-07-11 NOTE — Progress Notes (Signed)
Dr. Ladene Artist notified of patient PO intake of fish sandwich at 1530.

## 2017-07-11 NOTE — Telephone Encounter (Signed)
note addressed to coumadin added to February file.

## 2017-07-11 NOTE — Transfer of Care (Signed)
Immediate Anesthesia Transfer of Care Note  Patient: [name]  Procedure(s) Performed: OPEN REDUCTION INTERNAL FIXATION (ORIF) RIGHT FORTH METACARPAL (Right )  Patient Location: ED  Anesthesia Type:MAC  Level of Consciousness: awake, alert , oriented and patient cooperative  Airway & Oxygen Therapy: Patient Spontanous Breathing and Patient connected to face mask oxygen  Post-op Assessment: Report given to RN, Post -op Vital signs reviewed and stable and Patient moving all extremities  Post vital signs: Reviewed and stable  Last Vitals:  Vitals:   07/24/2017 1800 07/18/2017 1815  BP:  (!) 182/70  Pulse: 73 74  Resp: (!) 21 (!) 22  Temp:    SpO2: 99% 92%    Last Pain: There were no vitals filed for this visit.       Complications: No apparent anesthesia complications

## 2017-07-11 NOTE — Discharge Instructions (Signed)
KEEP BANDAGE CLEAN AND DRY CALL OFFICE FOR F/U APPT [phone] in 10 days KEEP HAND ELEVATED ABOVE HEART OK TO APPLY ICE TO OPERATIVE AREA CONTACT OFFICE IF ANY WORSENING PAIN OR CONCERNS.  

## 2017-07-11 NOTE — ED Notes (Signed)
Before patient checked in I had walked out to check a car that was blocking the tunnel. The patients family was walking out with a wheel chair and I ask her if she needed help getting a patient out @ 16:20. Family member said no that she could handle him. At 16:22 she wheeled the patient in and he had cut his finger on the wheel chair.

## 2017-07-11 NOTE — ED Provider Notes (Addendum)
[facility] EMERGENCY DEPARTMENT Provider Note   CSN: [id number] Arrival date & time: 2017/07/13  1622     History   Chief Complaint Chief Complaint  Patient presents with  . Shortness of Breath  . Finger Injury    HPI [name] is a 77 y.o. male.  Patient is a 77 year old male with a history of coronary artery disease, hypertension, stage III-IV chronic kidney disease, atrial fibrillation, peripheral artery disease these recently discontinued his furosemide 1 month ago after elevated creatinine at 5 presenting today with 1 week of congestion, fever and shortness of breath which continued to worsen.  Patient was satting in the mid 38s when he saw his doctor today.  He required nonrebreather and nasal cannula to improve his oxygen saturation.  Patient had an x-ray done at his PCP that showed multifocal pneumonia.  He states that he has been taking his medications as per prescribed and has not taken any Lasix or anti-inflammatories.  He is still taking Coumadin.  Patient was given Rocephin while waiting but unfortunately while in the waiting room he had his hand and finger stuck in the wheelchair causing a partial amputation.  Patient had to go to the OR with hand surgery for repair of his right finger.  Patient is now here with his ongoing respiratory complaints.  He denies any abdominal pain, no new leg swelling.  He does not check his weight regularly.  He has had no vomiting or diarrhea states he has had some blood draining from his nose down his throat that he will cough up in the mornings.  He has not had no hemoptysis or sputum production.   The history is provided by the patient and the spouse.    Past Medical History:  Diagnosis Date  . Alcohol abuse   . Arthritis    "just about qwhere now" (08/28/2015)  . Benign prostatic hyperplasia   . CAD (coronary artery disease) 2005   mLAD ~90%, D1/RI-70, mRCA 70% --> CABG 3: LIMA-LAD, SVG-D1/RI, SVG-dRCA  . HTN  (hypertension)   . Hyperlipemia   . Iron deficiency anemia   . Myocardial infarction [facility]) 1995   s/p PTCA  . Obesity   . Peripheral vascular disease (HCC)   . PONV (postoperative nausea and vomiting)   . Type II diabetes mellitus [facility])     Patient Active Problem List   Diagnosis Date Noted  . Acute on chronic diastolic heart failure (HCC) 06/10/2017  . Encounter for therapeutic drug monitoring 05/02/2017  . Atrial fibrillation (HCC) 04/27/2017  . PAD (peripheral artery disease) (HCC)   . Bilateral claudication of lower limb (HCC) 02/24/2016  . Pain in the chest   . Type 2 diabetes mellitus with vascular disease (HCC) 08/28/2015  . Hyponatremia 08/28/2015  . CKD (chronic kidney disease), stage IV (HCC) 09/27/2014  . Essential hypertension 09/26/2013  . Hyperlipidemia 09/26/2013  . Hx of CABG 09/26/2013    Past Surgical History:  Procedure Laterality Date  . ABDOMINAL HERNIA REPAIR    . CARDIAC CATHETERIZATION  2005   mLAD ~90%, D1/RI-70, mRCA 70%   . CARDIAC CATHETERIZATION N/A 08/29/2015   Procedure: Left Heart Cath and Coronary Angiography;  Surgeon: Marykay Lex, MD;  Location: [facility] INVASIVE CV LAB;  Service: Cardiovascular;  Laterality: N/A;  . CAROTID ENDARTERECTOMY    . CORONARY ANGIOPLASTY WITH STENT PLACEMENT  12/1993; 04/1994  . CORONARY ARTERY BYPASS GRAFT  2005   CABG 3: LIMA-LAD, SVG-D1/RI, SVG-dRCA (Dr.VanTrigt)  .  EYE SURGERY Left 1968   "dug hot steel out that was imbedded"   . HERNIA REPAIR    . INGUINAL HERNIA REPAIR Right   . KNEE ARTHROSCOPY Left   . KNEE CARTILAGE SURGERY Right 1960s   "took cartilage out"  . LOWER EXTREMITY ANGIOGRAPHY N/A 09/27/2016   Procedure: Lower Extremity Angiography;  Surgeon: Runell Gess, MD;  Location: [facility] INVASIVE CV LAB;  Service: Cardiovascular;  Laterality: N/A;  . LOWER EXTREMITY INTERVENTION N/A 11/04/2016   Procedure: Lower Extremity Intervention;  Surgeon: Runell Gess, MD;  Location: [facility] INVASIVE CV LAB;   Service: Cardiovascular;  Laterality: N/A;  . SHOULDER ARTHROSCOPY W/ ROTATOR CUFF REPAIR Left   . SKIN CANCER DESTRUCTION     "6 cut off" (08/28/2015)  . UMBILICAL HERNIA REPAIR         Home Medications    Prior to Admission medications   Medication Sig Start Date End Date Taking? Authorizing Provider  acetaminophen (TYLENOL) 325 MG tablet Take 2 tablets (650 mg total) by mouth every 4 (four) hours as needed for headache or mild pain. 09/28/16   Abelino Derrick, PA-C  amiodarone (PACERONE) 200 MG tablet Take 1 tablet (200 mg total) by mouth daily. 05/18/17   Lyn Records, MD  aspirin EC 81 MG tablet Take 81 mg by mouth at bedtime.     [provider]  Bacitracin-Polymyxin B (POLYSPORIN EX) Apply 1 application topically 2 (two) times daily as needed (wound care).     [provider]  cloNIDine (CATAPRES - DOSED IN MG/24 HR) 0.1 mg/24hr patch Place 0.1 mg onto the skin once a week.    [provider]  Coenzyme Q10 (CO Q-10) 100 MG CAPS Take 100 mg by mouth daily.    [provider]  docusate sodium (COLACE) 50 MG capsule Take 1 capsule (50 mg total) by mouth 2 (two) times daily. 01/20/17 01/15/18  Leone Brand, NP  ezetimibe (ZETIA) 10 MG tablet Take 5 mg by mouth daily.    [provider]  ferrous sulfate 325 (65 FE) MG tablet Take 325 mg by mouth at bedtime.     [provider]  furosemide (LASIX) 80 MG tablet Take 1 tablet (80 mg total) by mouth daily. 05/19/17 05/14/18  Lyn Records, MD  ibuprofen (ADVIL,MOTRIN) 200 MG tablet Take 400 mg by mouth 2 (two) times daily as needed for mild pain.    [provider]  nitroGLYCERIN (NITROSTAT) 0.4 MG SL tablet Place 0.4 mg under the tongue every 5 (five) minutes as needed for chest pain.    [provider]  Oxymetazoline HCl (NASAL SPRAY NA) Place 1 spray into the nose 2 (two) times daily as needed (nasal congestion).    [provider]  polyvinyl alcohol  (ARTIFICIAL TEARS) 1.4 % ophthalmic solution Place 1 drop into both eyes 2 (two) times daily as needed for dry eyes.    [provider]  warfarin (COUMADIN) 2.5 MG tablet TAKE AS DIRECTED BY COUMADIN CLINIC 05/30/17   Lyn Records, MD    Family History Family History  Problem Relation Age of Onset  . Heart attack Mother   . CAD Father     Social History Social History   Tobacco Use  . Smoking status: Former Smoker    Packs/day: 1.50    Years: 35.00    Pack years: 52.50    Types: Cigarettes    Last attempt to quit: 01/08/1994    Years  since quitting: 23.5  . Smokeless tobacco: Never Used  Substance Use Topics  . Alcohol use: Yes    Alcohol/week: 16.8 oz    Types: 28 Cans of beer per week    Comment: 3-5 beers everyday.  . Drug use: No     Allergies   Food; Other; Statins; Hydralazine; Metoprolol; Fish oil; and Norvasc [amlodipine]   Review of Systems Review of Systems  All other systems reviewed and are negative.    Physical Exam Updated Vital Signs BP (!) 203/76   Pulse 73   Temp 98.7 F (37.1 C)   Resp (!) 27   Ht 5\' 8"  (1.727 m)   Wt 76.2 kg (168 lb)   SpO2 92%   BMI 25.54 kg/m   Physical Exam  Constitutional: He is oriented to person, place, and time. He appears well-developed and well-nourished. No distress.  HENT:  Head: Normocephalic and atraumatic.  Mouth/Throat: Oropharynx is clear and moist.  Eyes: Conjunctivae and EOM are normal. Pupils are equal, round, and reactive to light.  Neck: Normal range of motion. Neck supple.  Cardiovascular: Normal rate, regular rhythm and intact distal pulses.  No murmur heard. Pulmonary/Chest: Effort normal. Tachypnea noted. No respiratory distress. He has no wheezes. He has rhonchi. He has rales.  Abdominal: Soft. He exhibits no distension. There is no tenderness. There is no rebound and no guarding.  Musculoskeletal: Normal range of motion. He exhibits no tenderness.       Right lower leg: He  exhibits edema.       Left lower leg: He exhibits edema.  1+ pitting edema bilateral lower extremities.  Right hand bandaged with new Coban and surgical dressing applied  Neurological: He is alert and oriented to person, place, and time.  Skin: Skin is warm and dry. No rash noted. No erythema. There is pallor.  Psychiatric: He has a normal mood and affect. His behavior is normal.  Nursing note and vitals reviewed.    ED Treatments / Results  Labs (all labs ordered are listed, but only abnormal results are displayed) Labs Reviewed  BASIC METABOLIC PANEL - Abnormal; Notable for the following components:      Result Value   CO2 16 (*)    Glucose, Bld 182 (*)    BUN 70 (*)    Creatinine, Ser 7.28 (*)    Calcium 8.2 (*)    GFR calc non Af Amer 6 (*)    GFR calc Af Amer 7 (*)    All other components within normal limits  CBC - Abnormal; Notable for the following components:   WBC 16.3 (*)    RBC 2.22 (*)    Hemoglobin 6.6 (*)    HCT 21.2 (*)    Platelets 407 (*)    All other components within normal limits  PROTIME-INR - Abnormal; Notable for the following components:   Prothrombin Time 32.2 (*)    All other components within normal limits  BRAIN NATRIURETIC PEPTIDE - Abnormal; Notable for the following components:   B Natriuretic Peptide 2,166.2 (*)    All other components within normal limits  I-STAT TROPONIN, ED - Abnormal; Notable for the following components:   Troponin i, poc 0.12 (*)    All other components within normal limits  I-STAT VENOUS BLOOD GAS, ED - Abnormal; Notable for the following components:   pCO2, Ven 33.2 (*)    pO2, Ven 22.0 (*)    Bicarbonate 14.8 (*)    TCO2 16 (*)  Acid-base deficit 11.0 (*)    All other components within normal limits  CULTURE, BLOOD (ROUTINE X 2)  CULTURE, BLOOD (ROUTINE X 2)  RESPIRATORY PANEL BY PCR  URINALYSIS, ROUTINE W REFLEX MICROSCOPIC  I-STAT CG4 LACTIC ACID, ED  I-STAT TROPONIN, ED  I-STAT CG4 LACTIC ACID, ED    TYPE AND SCREEN  ABO/RH    EKG  EKG Interpretation  Date/Time:  Monday July 11 2017 16:36:30 EST Ventricular Rate:  81 PR Interval:  172 QRS Duration: 114 QT Interval:  426 QTC Calculation: 494 R Axis:   76 Text Interpretation:  Normal sinus rhythm Nonspecific ST abnormality Abnormal QRS-T angle, consider primary T wave abnormality Prolonged QT Nonspecific ST abnormality Confirmed by Cathren Laine ([id number]) on 07/15/2017 5:24:03 PM       Radiology Dg Chest 2 View  Result Date: 07/18/2017 CLINICAL DATA:  Hemoptysis which shortness-of-breath and fever. EXAM: CHEST  2 VIEW COMPARISON:  09/23/2016 FINDINGS: Sternotomy wires unchanged. Lungs are adequately inflated demonstrate patchy hazy airspace opacification over the mid to lower lungs likely due to multifocal pneumonia, although hemorrhage could also have this appearance. Mild blunting of the right costophrenic angle unchanged. Mild stable cardiomegaly. Calcified plaque over the aortic arch. Remainder of the exam is unchanged. IMPRESSION: Hazy multifocal airspace process over the mid to lower lungs likely multifocal pneumonia, although hemorrhage could have this appearance. Minimal stable blunting of the right costophrenic angle. Stable cardiomegaly. Electronically Signed   By: Elberta Fortis M.D.   On: 07/04/2017 12:40   Dg Hand Complete Right  Result Date: 07/13/2017 CLINICAL DATA:  77 year old male with a history injury to hand. Lacerations. EXAM: RIGHT HAND - COMPLETE 3+ VIEW COMPARISON:  None. FINDINGS: Acute fracture of the distal aspect of the distal phalanx fifth digit right hand. Soft tissue disruption on the dorsal aspect of the fourth digit near the distal interphalangeal joint and the proximal interphalangeal joint. No radiopaque foreign body. Soft tissue injury involving the fifth digit of the right hand, with no radiopaque foreign body. Degenerative changes of the interphalangeal joints. Extensive arterial calcifications of  the right hand. IMPRESSION: Acute fracture of the distal phalanx of the fifth digit right hand with associated soft tissue disruption/injury. Evidence of soft tissue injury on the dorsal aspect of the fourth digit, right hand, overlying the DIP and PIP. Extensive calcifications of the arteries of the right hand. Electronically Signed   By: Gilmer Mor D.O.   On: 07/12/2017 17:08    Procedures Procedures (including critical care time)  Medications Ordered in ED Medications  doxycycline (VIBRAMYCIN) 100 mg in sodium chloride 0.9 % 250 mL IVPB (not administered)  Tdap (BOOSTRIX) injection 0.5 mL (0.5 mLs Intramuscular Given 07/06/2017 1757)  cefTRIAXone (ROCEPHIN) 1 g in sodium chloride 0.9 % 100 mL IVPB (0 g Intravenous Stopped 07/02/2017 2002)     Initial Impression / Assessment and Plan / ED Course  I have reviewed the triage vital signs and the nursing notes.  Pertinent labs & imaging results that were available during my care of the patient were reviewed by me and considered in my medical decision making (see chart for details).     Elderly male presenting with multiple medical problems here for shortness of breath and fever.  Patient seen by PCP today and had a chest x-ray which showed multifocal pneumonia.  Here patient had found to have a normal lactic acid, VBG with metabolic acidosis with respiratory compensation, BNP elevated at greater than 2000, mild troponin leak of 0.12,  CBC with worsening anemia with a hemoglobin of 6.6 from 8.15-month ago, also leukocytosis of 16,000.  Patient's CMP today shows worsening acute on chronic kidney disease with a creatinine of 7.28.  Also evidence of some metabolic acidosis and uremia.  INR is 3.16.  Patient denies any symptoms of urinary retention.  He is already received Rocephin and doxycycline IV.  Will discuss patient with nephrology and will admit to the hospitalist service.  Unclear if they want him to have a transfusion at this time or if he needs  to be diuresed.  Patient currently requiring 3 L of oxygen to maintain between 89 and 92%.  He currently denies feeling short of breath.  CRITICAL CARE Performed by: Teng Decou Total critical care time: 30 minutes Critical care time was exclusive of separately billable procedures and treating other patients. Critical care was necessary to treat or prevent imminent or life-threatening deterioration. Critical care was time spent personally by me on the following activities: development of treatment plan with patient and/or surrogate as well as nursing, discussions with consultants, evaluation of patient's response to treatment, examination of patient, obtaining history from patient or surrogate, ordering and performing treatments and interventions, ordering and review of laboratory studies, ordering and review of radiographic studies, pulse oximetry and re-evaluation of patient's condition.   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

## 2017-07-11 NOTE — ED Notes (Signed)
Per Dr. Anitra Lauth bladder scan after pt voids

## 2017-07-11 NOTE — ED Notes (Signed)
Delay in lab draw,  Xray in room. 

## 2017-07-11 NOTE — ED Notes (Signed)
Main lab called to follow up about a respiratory swab they were supposed to receive when they called 5 hours ago. Stated they received a strep swab and not a respiratory swab. States the previous RN was supposed to send down the right swab.

## 2017-07-11 NOTE — Op Note (Signed)
PREOPERATIVE DIAGNOSIS: Right ring finger laceration without tendon involvement Right small finger open distal phalanx fracture  POSTOPERATIVE DIAGNOSIS: Same  ATTENDING SURGEON: Dr. Bradly Bienenstock who was scrubbed and present for the entire procedure  ASSISTANT SURGEON: None  ANESTHESIA: Local 1% Xylocaine core percent Marcaine local block  OPERATIVE PROCEDURE: #1: Open debridement of right small finger open distal phalanx fracture debridement of skin subcutaneous tissue and bone #2: Open treatment of right small finger open distal phalanx fracture requiring internal fixation #3: Right small finger nail bed repair #4: Radiographs 2 views right small finger #5: Traumatic laceration repair 2 cm right small finger #6: Traumatic laceration repair right ring finger 2 cm #7: Traumatic laceration repair right ring finger 1 cm  IMPLANTS: One 0.045 K wire  RADIOGRAPHIC INTERPRETATION: AP lateral views of the finger do show the K wire fixation place in good position  SURGICAL INDICATIONS: The patient is a right-hand-dominant gentleman who presented to the emergency department with increased oxygen requirements and was being evaluated for possible pneumonia. He was in a wheelchair in the ER and sustained the open injuries to the right ring and small fingers. I was consult to for management for the finger injuries. Risks benefits and alternatives were discussed in detail with the patient in a signed informed consent was obtained.  SURGICAL TECHNIQUE: Patient is properly identified in the preoperative holding area and a mark with a permanent marker made on the right small finger and ring finger to indicate the correct operative site. Patient brought back to operating room placed supine on anesthesia and table where the local anesthetic had been administered. A well-padded tourniquet was then placed on the right forearm and sealed with the appropriate drape. The right upper extremities and prepped and  draped in normal sterile fashion. Timeout was called the correct site was identified and the procedure then begun. Debridement of the skin and subcutaneous tissue were then carried out to the ring finger. The traumatic lacerations were then carefully irrigated and debrided taken back to the unhealthy tissue. Following is the traumatic lacerations were then closed with simple Prolene sutures. Patient had 2 separate lacerationS 1 that measured 2 cm the other one measured 1 cm. After treatment of the ring finger attention was turned to the small finger. Open debridement of the skin and subcutaneous tissue and bone was then carried out of the small finger. This was removed devitalized tissue. The wound was then thoroughly irrigated. Debridement was then carried out with small curettes knife and sharp scissors. After excisional debridement 0.045 K wire was then placed antegrade and then back retrograde across the fracture site across the distal interphalangeal joint to stabilize the fracture. Following is the nail bed was then repaired with simple chromic sutures. The traumatic laceration the near circumferential laceration was then repaired with simple Prolene sutures which measured 2 cm in length. K wire was then cut and bent. Final radiographs were then obtained. Patient's was then placed in a sterile compressive bandage. The tourniquet deflated. There is good perfusion of fingertips. Patient placed in a small finger splint taken recovery room in good condition.  POSTOPERATIVE PLAN: Patient be sent back down to the emergency department for continuation of his medical evaluation. Hospital mission likely. The patient will need to see me back in the office in approximate 7-10 days for wound check and likely suture removal. X-rays of the small finger down to see her therapist for a tip protector splint. K wire in for a total 4 weeks. Radiographs  at each visit.

## 2017-07-11 NOTE — Anesthesia Preprocedure Evaluation (Addendum)
Anesthesia Evaluation  Patient identified by MRN, date of birth, ID band Patient awake    Reviewed: Allergy & Precautions, H&P , NPO status , Patient's Chart, lab work & pertinent test results  History of Anesthesia Complications (+) PONV and history of anesthetic complications  Airway Mallampati: II   Neck ROM: full    Dental   Pulmonary pneumonia, former smoker,    breath sounds clear to auscultation       Cardiovascular hypertension, + CAD, + Past MI, + CABG and + Peripheral Vascular Disease  + dysrhythmias Atrial Fibrillation  Rhythm:regular Rate:Normal     Neuro/Psych    GI/Hepatic (+)     substance abuse  alcohol use,   Endo/Other  diabetes, Type 2  Renal/GU Renal InsufficiencyRenal disease     Musculoskeletal  (+) Arthritis ,   Abdominal   Peds  Hematology  (+) anemia ,   Anesthesia Other Findings   Reproductive/Obstetrics                            Anesthesia Physical Anesthesia Plan  ASA: IV  Anesthesia Plan: MAC   Post-op Pain Management:    Induction: Intravenous  PONV Risk Score and Plan: 2 and Ondansetron, Propofol infusion and Treatment may vary due to age or medical condition  Airway Management Planned: Simple Face Mask  Additional Equipment:   Intra-op Plan:   Post-operative Plan:   Informed Consent: I have reviewed the patients History and Physical, chart, labs and discussed the procedure including the risks, benefits and alternatives for the proposed anesthesia with the patient or authorized representative who has indicated his/her understanding and acceptance.     Plan Discussed with: CRNA, Anesthesiologist and Surgeon  Anesthesia Plan Comments:         Anesthesia Quick Evaluation

## 2017-07-12 ENCOUNTER — Encounter (HOSPITAL_COMMUNITY): Payer: Self-pay | Admitting: Emergency Medicine

## 2017-07-12 ENCOUNTER — Other Ambulatory Visit: Payer: Self-pay

## 2017-07-12 DIAGNOSIS — I160 Hypertensive urgency: Secondary | ICD-10-CM

## 2017-07-12 LAB — MAGNESIUM: MAGNESIUM: 1.9 mg/dL (ref 1.7–2.4)

## 2017-07-12 LAB — CBC
HCT: 21.6 % — ABNORMAL LOW (ref 39.0–52.0)
HEMATOCRIT: 19.5 % — AB (ref 39.0–52.0)
HEMOGLOBIN: 6.4 g/dL — AB (ref 13.0–17.0)
HEMOGLOBIN: 7.1 g/dL — AB (ref 13.0–17.0)
MCH: 30.8 pg (ref 26.0–34.0)
MCH: 30.7 pg (ref 26.0–34.0)
MCHC: 32.8 g/dL (ref 30.0–36.0)
MCHC: 32.9 g/dL (ref 30.0–36.0)
MCV: 93.8 fL (ref 78.0–100.0)
MCV: 93.5 fL (ref 78.0–100.0)
PLATELETS: 365 10*3/uL (ref 150–400)
Platelets: 326 10*3/uL (ref 150–400)
RBC: 2.08 MIL/uL — AB (ref 4.22–5.81)
RBC: 2.31 MIL/uL — AB (ref 4.22–5.81)
RDW: 14.2 % (ref 11.5–15.5)
RDW: 15 % (ref 11.5–15.5)
WBC: 18.2 10*3/uL — ABNORMAL HIGH (ref 4.0–10.5)
WBC: 19.7 10*3/uL — ABNORMAL HIGH (ref 4.0–10.5)

## 2017-07-12 LAB — RESPIRATORY PANEL BY PCR
Adenovirus: NOT DETECTED
Bordetella pertussis: NOT DETECTED
CORONAVIRUS HKU1-RVPPCR: NOT DETECTED
CORONAVIRUS NL63-RVPPCR: NOT DETECTED
CORONAVIRUS OC43-RVPPCR: NOT DETECTED
Chlamydophila pneumoniae: NOT DETECTED
Coronavirus 229E: NOT DETECTED
INFLUENZA A H1 2009-RVPPR: NOT DETECTED
Influenza A H1: NOT DETECTED
Influenza A H3: NOT DETECTED
Influenza A: NOT DETECTED
Influenza B: NOT DETECTED
MYCOPLASMA PNEUMONIAE-RVPPCR: NOT DETECTED
Metapneumovirus: NOT DETECTED
PARAINFLUENZA VIRUS 1-RVPPCR: NOT DETECTED
PARAINFLUENZA VIRUS 2-RVPPCR: NOT DETECTED
Parainfluenza Virus 3: NOT DETECTED
Parainfluenza Virus 4: NOT DETECTED
RESPIRATORY SYNCYTIAL VIRUS-RVPPCR: NOT DETECTED
Rhinovirus / Enterovirus: NOT DETECTED

## 2017-07-12 LAB — INFLUENZA PANEL BY PCR (TYPE A & B)
INFLBPCR: NEGATIVE
Influenza A By PCR: NEGATIVE

## 2017-07-12 LAB — COMPREHENSIVE METABOLIC PANEL
ALBUMIN: 2.1 g/dL — AB (ref 3.5–5.0)
ALT: 19 U/L (ref 17–63)
ANION GAP: 14 (ref 5–15)
AST: 28 U/L (ref 15–41)
Alkaline Phosphatase: 83 U/L (ref 38–126)
BILIRUBIN TOTAL: 1.1 mg/dL (ref 0.3–1.2)
BUN: 72 mg/dL — ABNORMAL HIGH (ref 6–20)
CALCIUM: 7.8 mg/dL — AB (ref 8.9–10.3)
CO2: 15 mmol/L — ABNORMAL LOW (ref 22–32)
Chloride: 111 mmol/L (ref 101–111)
Creatinine, Ser: 7.32 mg/dL — ABNORMAL HIGH (ref 0.61–1.24)
GFR calc Af Amer: 7 mL/min — ABNORMAL LOW (ref >60)
GFR calc non Af Amer: 6 mL/min — ABNORMAL LOW (ref >60)
GLUCOSE: 154 mg/dL — AB (ref 65–99)
Potassium: 4.7 mmol/L (ref 3.5–5.1)
SODIUM: 140 mmol/L (ref 135–145)
TOTAL PROTEIN: 5.4 g/dL — AB (ref 6.5–8.1)

## 2017-07-12 LAB — GLUCOSE, CAPILLARY
GLUCOSE-CAPILLARY: 154 mg/dL — AB (ref 65–99)
GLUCOSE-CAPILLARY: 115 mg/dL — AB (ref 65–99)
GLUCOSE-CAPILLARY: 162 mg/dL — AB (ref 65–99)
GLUCOSE-CAPILLARY: 159 mg/dL — AB (ref 65–99)
Glucose-Capillary: 145 mg/dL — ABNORMAL HIGH (ref 65–99)

## 2017-07-12 LAB — TROPONIN I
Troponin I: 0.13 ng/mL (ref <0.03)
Troponin I: 0.13 ng/mL (ref <0.03)

## 2017-07-12 LAB — IRON AND TIBC
IRON: 21 ug/dL — AB (ref 45–182)
SATURATION RATIOS: 9 % — AB (ref 17.9–39.5)
TIBC: 234 ug/dL — AB (ref 250–450)
UIBC: 213 ug/dL

## 2017-07-12 LAB — PREPARE RBC (CROSSMATCH)

## 2017-07-12 LAB — MRSA PCR SCREENING: MRSA by PCR: NEGATIVE

## 2017-07-12 LAB — CREATININE, URINE, RANDOM: CREATININE, URINE: 71.04 mg/dL

## 2017-07-12 LAB — HEMOGLOBIN A1C
Hgb A1c MFr Bld: 5.5 % (ref 4.8–5.6)
MEAN PLASMA GLUCOSE: 111.15 mg/dL

## 2017-07-12 LAB — VITAMIN B12: Vitamin B-12: <50 pg/mL — ABNORMAL LOW (ref 180–914)

## 2017-07-12 LAB — FERRITIN: Ferritin: 770 ng/mL — ABNORMAL HIGH (ref 24–336)

## 2017-07-12 LAB — ABO/RH: ABO/RH(D): B POS

## 2017-07-12 LAB — TSH: TSH: 1.632 u[IU]/mL (ref 0.350–4.500)

## 2017-07-12 LAB — STREP PNEUMONIAE URINARY ANTIGEN: STREP PNEUMO URINARY ANTIGEN: NEGATIVE

## 2017-07-12 LAB — FOLATE: FOLATE: 46 ng/mL (ref >5.9)

## 2017-07-12 LAB — SODIUM, URINE, RANDOM: SODIUM UR: 57 mmol/L

## 2017-07-12 LAB — PROTIME-INR
INR: 3.73
PROTHROMBIN TIME: 36.6 s — AB (ref 11.4–15.2)

## 2017-07-12 LAB — PHOSPHORUS: PHOSPHORUS: 5.6 mg/dL — AB (ref 2.5–4.6)

## 2017-07-12 MED ORDER — IPRATROPIUM-ALBUTEROL 0.5-2.5 (3) MG/3ML IN SOLN
3.0000 mL | Freq: Four times a day (QID) | RESPIRATORY_TRACT | Status: DC
  Administered 2017-07-12: 3 mL via RESPIRATORY_TRACT
  Filled 2017-07-12: qty 3

## 2017-07-12 MED ORDER — EZETIMIBE 10 MG PO TABS
5.0000 mg | ORAL_TABLET | Freq: Every day | ORAL | Status: DC
  Administered 2017-07-12 – 2017-07-16 (×5): 5 mg via ORAL
  Filled 2017-07-12 (×5): qty 1

## 2017-07-12 MED ORDER — HYDROCODONE-ACETAMINOPHEN 5-325 MG PO TABS
1.0000 | ORAL_TABLET | ORAL | Status: DC | PRN
Start: 2017-07-12 — End: 2017-07-17
  Administered 2017-07-13: 1 via ORAL
  Filled 2017-07-12: qty 1

## 2017-07-12 MED ORDER — SODIUM CHLORIDE 0.9 % IV SOLN
Freq: Once | INTRAVENOUS | Status: AC
  Administered 2017-07-12: 02:00:00 via INTRAVENOUS

## 2017-07-12 MED ORDER — SODIUM CHLORIDE 0.9 % IV SOLN
500.0000 mg | INTRAVENOUS | Status: DC
  Administered 2017-07-12: 500 mg via INTRAVENOUS
  Filled 2017-07-12: qty 500

## 2017-07-12 MED ORDER — SODIUM CHLORIDE 0.9% FLUSH
3.0000 mL | Freq: Two times a day (BID) | INTRAVENOUS | Status: DC
  Administered 2017-07-12 – 2017-07-16 (×11): 3 mL via INTRAVENOUS

## 2017-07-12 MED ORDER — ONDANSETRON HCL 4 MG/2ML IJ SOLN
4.0000 mg | Freq: Four times a day (QID) | INTRAMUSCULAR | Status: DC | PRN
  Administered 2017-07-14 – 2017-07-16 (×5): 4 mg via INTRAVENOUS
  Filled 2017-07-12 (×5): qty 2

## 2017-07-12 MED ORDER — SODIUM CHLORIDE 0.9% FLUSH: 3.0000 mL | INTRAVENOUS | Status: DC | PRN

## 2017-07-12 MED ORDER — ACETAMINOPHEN 650 MG RE SUPP: 650.0000 mg | Freq: Four times a day (QID) | RECTAL | Status: DC | PRN

## 2017-07-12 MED ORDER — ENSURE ENLIVE PO LIQD
237.0000 mL | Freq: Two times a day (BID) | ORAL | Status: DC
  Administered 2017-07-12: 237 mL via ORAL

## 2017-07-12 MED ORDER — SODIUM CHLORIDE 0.9 % IV SOLN
1.0000 g | INTRAVENOUS | Status: DC
  Administered 2017-07-12 – 2017-07-16 (×5): 1 g via INTRAVENOUS
  Filled 2017-07-12 (×6): qty 10

## 2017-07-12 MED ORDER — INSULIN ASPART 100 UNIT/ML ~~LOC~~ SOLN
0.0000 [IU] | Freq: Three times a day (TID) | SUBCUTANEOUS | Status: DC
  Administered 2017-07-12: 1 [IU] via SUBCUTANEOUS
  Administered 2017-07-12: 2 [IU] via SUBCUTANEOUS
  Administered 2017-07-13 – 2017-07-16 (×7): 1 [IU] via SUBCUTANEOUS

## 2017-07-12 MED ORDER — IPRATROPIUM-ALBUTEROL 0.5-2.5 (3) MG/3ML IN SOLN
3.0000 mL | Freq: Three times a day (TID) | RESPIRATORY_TRACT | Status: DC
  Administered 2017-07-12 – 2017-07-16 (×12): 3 mL via RESPIRATORY_TRACT
  Filled 2017-07-12 (×13): qty 3

## 2017-07-12 MED ORDER — SALINE SPRAY 0.65 % NA SOLN
1.0000 | NASAL | Status: DC | PRN
  Administered 2017-07-14: 1 via NASAL
  Filled 2017-07-12 (×2): qty 44

## 2017-07-12 MED ORDER — ACETAMINOPHEN 325 MG PO TABS
650.0000 mg | ORAL_TABLET | Freq: Once | ORAL | Status: AC
  Administered 2017-07-12: 650 mg via ORAL
  Filled 2017-07-12: qty 2

## 2017-07-12 MED ORDER — SODIUM CHLORIDE 0.9 % IV SOLN: 250.0000 mL | INTRAVENOUS | Status: DC | PRN

## 2017-07-12 MED ORDER — AMIODARONE HCL 200 MG PO TABS
200.0000 mg | ORAL_TABLET | Freq: Every day | ORAL | Status: DC
  Administered 2017-07-12 – 2017-07-16 (×5): 200 mg via ORAL
  Filled 2017-07-12 (×5): qty 1

## 2017-07-12 MED ORDER — GUAIFENESIN ER 600 MG PO TB12
600.0000 mg | ORAL_TABLET | Freq: Two times a day (BID) | ORAL | Status: DC
  Administered 2017-07-12 – 2017-07-16 (×11): 600 mg via ORAL
  Filled 2017-07-12 (×11): qty 1

## 2017-07-12 MED ORDER — CLONIDINE HCL 0.1 MG PO TABS
0.1000 mg | ORAL_TABLET | Freq: Two times a day (BID) | ORAL | Status: DC
  Administered 2017-07-12 – 2017-07-16 (×3): 0.1 mg via ORAL
  Filled 2017-07-12 (×6): qty 1

## 2017-07-12 MED ORDER — ACETAMINOPHEN 325 MG PO TABS
650.0000 mg | ORAL_TABLET | Freq: Four times a day (QID) | ORAL | Status: DC | PRN
  Administered 2017-07-12 – 2017-07-13 (×2): 650 mg via ORAL
  Filled 2017-07-12 (×2): qty 2

## 2017-07-12 MED ORDER — ONDANSETRON HCL 4 MG PO TABS: 4.0000 mg | ORAL_TABLET | Freq: Four times a day (QID) | ORAL | Status: DC | PRN

## 2017-07-12 MED ORDER — LEVALBUTEROL HCL 0.63 MG/3ML IN NEBU: 0.6300 mg | INHALATION_SOLUTION | Freq: Four times a day (QID) | RESPIRATORY_TRACT | Status: DC | PRN

## 2017-07-12 MED ORDER — INSULIN ASPART 100 UNIT/ML ~~LOC~~ SOLN: 0.0000 [IU] | Freq: Every day | SUBCUTANEOUS | Status: DC

## 2017-07-12 MED ORDER — VANCOMYCIN HCL 10 G IV SOLR
1500.0000 mg | Freq: Once | INTRAVENOUS | Status: DC
  Filled 2017-07-12: qty 1500

## 2017-07-12 MED ORDER — DIPHENHYDRAMINE HCL 25 MG PO CAPS
25.0000 mg | ORAL_CAPSULE | Freq: Once | ORAL | Status: AC
  Administered 2017-07-12: 25 mg via ORAL
  Filled 2017-07-12: qty 1

## 2017-07-12 MED ORDER — CLONIDINE HCL 0.1 MG/24HR TD PTWK
0.1000 mg | MEDICATED_PATCH | TRANSDERMAL | Status: DC
  Administered 2017-07-13: 0.1 mg via TRANSDERMAL
  Filled 2017-07-12: qty 1

## 2017-07-12 NOTE — Evaluation (Signed)
Physical Therapy Evaluation Patient Details Name: [name] MRN: [id number] DOB: [birth year] Today's Date: 07/12/2017   History of Present Illness  [name] is a 77 y.o. male with medical history significant of a.fib on coumadin, CAD, chronic diastolic CHF, status post CABG , CK D stage IV, DM 2,  arterial disease.  Presented with one-week history of nasal congestion intermittent fevers and shortness of breath that has ben progressive, as well as epistaxis. Reports only mild cough. He have been coughing up some blood in AM's but that he feels related to his nose bleeds.  Also with fever.  Pt hypoxic in ED as well.  Pt somehow got finger caught in wheelchair in the ED and had to have surgery due to injury.   Clinical Impression  Pt admitted with above diagnosis. Pt currently with functional limitations due to the deficits listed below (see PT Problem List). Pt was able to walk on unit with good balance.  Does desat at end of walk to 80% on 7LO2 and took 2 minutes to recover to >90%.  Will follow acutely. Pt will benefit from skilled PT to increase their independence and safety with mobility to allow discharge to the venue listed below.    Follow Up Recommendations No PT follow up;Supervision - Intermittent    Equipment Recommendations  None recommended by PT    Recommendations for Other Services       Precautions / Restrictions Precautions Precautions: Fall Restrictions Weight Bearing Restrictions: No      Mobility  Bed Mobility               General bed mobility comments: in chair on arrival  Transfers Overall transfer level: Needs assistance Equipment used: None Transfers: Sit to/from Stand Sit to Stand: Supervision            Ambulation/Gait Ambulation/Gait assistance: Min guard Ambulation Distance (Feet): 280 Feet Assistive device: None Gait Pattern/deviations: Step-through pattern;Decreased stride length   Gait velocity interpretation: Below  normal speed for age/gender General Gait Details: No LOB with gait however no challenges given.  DOE 3/4 with pt showing signs of fatigue near end of walk.    Stairs            Wheelchair Mobility    Modified Rankin (Stroke Patients Only)       Balance Overall balance assessment: Needs assistance Sitting-balance support: No upper extremity supported;Feet supported Sitting balance-Leahy Scale: Good     Standing balance support: No upper extremity supported;During functional activity Standing balance-Leahy Scale: Fair Standing balance comment: can stand statically without difficulty                             Pertinent Vitals/Pain Pain Assessment: No/denies pain    Home Living Family/patient expects to be discharged to:: Private residence Living Arrangements: Spouse/significant other;Children Available Help at Discharge: Family;Available 24 hours/day Type of Home: House Home Access: Stairs to enter Entrance Stairs-Rails: None Entrance Stairs-Number of Steps: 2 Home Layout: One level Home Equipment: None      Prior Function Level of Independence: Independent               Hand Dominance        Extremity/Trunk Assessment   Upper Extremity Assessment Upper Extremity Assessment: Defer to OT evaluation    Lower Extremity Assessment Lower Extremity Assessment: Generalized weakness    Cervical / Trunk Assessment Cervical / Trunk Assessment: Normal  Communication  Communication: No difficulties  Cognition Arousal/Alertness: Awake/alert Behavior During Therapy: WFL for tasks assessed/performed Overall Cognitive Status: Within Functional Limits for tasks assessed                                        General Comments      Exercises     Assessment/Plan    PT Assessment Patient needs continued PT services  PT Problem List Decreased strength;Decreased activity tolerance;Decreased balance;Decreased mobility;Decreased  knowledge of use of DME;Decreased safety awareness;Decreased knowledge of precautions;Cardiopulmonary status limiting activity       PT Treatment Interventions DME instruction;Gait training;Stair training;Functional mobility training;Therapeutic activities;Therapeutic exercise;Balance training;Patient/family education    PT Goals (Current goals can be found in the Care Plan section)  Acute Rehab PT Goals Patient Stated Goal: to go home PT Goal Formulation: With patient Time For Goal Achievement: 07/26/17 Potential to Achieve Goals: Good    Frequency Min 3X/week   Barriers to discharge        Co-evaluation               AM-PAC PT "6 Clicks" Daily Activity  Outcome Measure Difficulty turning over in bed (including adjusting bedclothes, sheets and blankets)?: None Difficulty moving from lying on back to sitting on the side of the bed? : None Difficulty sitting down on and standing up from a chair with arms (e.g., wheelchair, bedside commode, etc,.)?: None Help needed moving to and from a bed to chair (including a wheelchair)?: None Help needed walking in hospital room?: A Little Help needed climbing 3-5 steps with a railing? : A Lot 6 Click Score: 21    End of Session Equipment Utilized During Treatment: Gait belt;Oxygen Activity Tolerance: Patient tolerated treatment well Patient left: in chair;with call bell/phone within reach;with chair alarm set Nurse Communication: Mobility status PT Visit Diagnosis: Muscle weakness (generalized) (M62.81)    Time: 1034-1050 PT Time Calculation (min) (ACUTE ONLY): 16 min   Charges:   PT Evaluation $PT Eval Moderate Complexity: 1 Mod     PT G Codes:        Saliha Salts,PT Acute Rehabilitation [phone] (pager)   Berline Lopes 07/12/2017, 11:54 AM

## 2017-07-12 NOTE — Progress Notes (Addendum)
OT Evaluation  PTA, pt lived at home with his wife, was independent with ADL and mobility, drove and was very active in the community. Pt currently requires mod A with ADL  And minguard A with mobility. Pt desats on 7L to 88 with bed to chair mobility; HR 75; BP 166/72. R hand with significant edema. Dressing adjusted around wrist to improve positioning of dressing, hand elevated and ice applied to R hand. Edema began to decrease with change of position of dressing around wrist- Nsg notified. Will follow acutely to educate on energy conservation, management of R hand and compensatory strategies for ADL. Pt states his wife will be able to assist after DC. Pt to follow up with Dr. Orlan Leavens regarding any further outpt needs for R hand.    07/12/17 1200  OT Visit Information  Last OT Received On 07/12/17  Assistance Needed +1  History of Present Illness [name] is a 77 y.o. male with medical history significant of a.fib on coumadin, CAD, chronic diastolic CHF, status post CABG , CK D stage IV, DM 2,  arterial disease.  Presented with one-week history of nasal congestion intermittent fevers and shortness of breath that has ben progressive, as well as epistaxis. Reports only mild cough. He have been coughing up some blood in AM's but that he feels related to his nose bleeds.  Also with fever.  Pt hypoxic in ED as well.  Pt somehow got finger caught in wheelchair in the ED and underwent I & D of 4th and 5th digits with  5th nail bed repair adn ORIF distal phalanx fx.   Precautions  Precautions Fall  Precaution Comments watch O2 sats  Restrictions  Weight Bearing Restrictions Yes - WB through R elbow only - no formal orders but pt has R little finger fx s/p ORIF  Home Living  Family/patient expects to be discharged to: Private residence  Living Arrangements Spouse/significant other;Children  Available Help at Discharge Family;Available 24 hours/day  Type of Home House  Home Access Stairs to enter   Entrance Stairs-Number of Steps 2  Entrance Stairs-Rails None  Home Layout One level  Scientist, physiological Yes  How Accessible Accessible via walker  Home Equipment None  Prior Function  Level of Independence Independent  Comments drives; very active; involved with his church  Communication  Communication No difficulties  Pain Assessment  Pain Assessment Faces  Faces Pain Scale 2  Pain Location R hand  Pain Descriptors / Indicators Discomfort  Pain Intervention(s) Limited activity within patient's tolerance  Cognition  Arousal/Alertness Awake/alert  Behavior During Therapy WFL for tasks assessed/performed  Overall Cognitive Status Within Functional Limits for tasks assessed  Upper Extremity Assessment  Upper Extremity Assessment RUE deficits/detail  RUE Deficits / Details R hand injury/immobilized; significant edema R hand - hand dressing wrapped around wrist contributing to edema; Pt staets he has limited ROM of index and middle fingers but is functional  RUE Coordination decreased fine motor  Lower Extremity Assessment  Lower Extremity Assessment Defer to PT evaluation  Cervical / Trunk Assessment  Cervical / Trunk Assessment Normal  ADL  Overall ADL's  Needs assistance/impaired  Eating/Feeding Minimal assistance  Eating/Feeding Details (indicate cue type and reason) Difficulty holding utnesils R hand; may benefit from tubing  Grooming Minimal assistance;Sitting  Upper Body Bathing Minimal assistance;Sitting  Lower Body Bathing Moderate assistance;Sit to/from stand  Upper Body Dressing  Moderate assistance;Sitting  Lower Body Dressing Moderate assistance;Sit  to/from Field seismologist- Architect and Hygiene Minimal assistance;Sit to/from stand  Functional mobility during ADLs Minimal assistance  General ADL Comments Limited by R hand injury and endurance at this  time  Bed Mobility  Overal bed mobility Needs Assistance  Bed Mobility Supine to Sit  Supine to sit HOB elevated;Supervision  Transfers  Overall transfer level Needs assistance  Equipment used None  Transfers Sit to/from Stand  Sit to Stand Min guard  Balance  Overall balance assessment Needs assistance  Sitting-balance support No upper extremity supported;Feet supported  Sitting balance-Leahy Scale Good  Standing balance support No upper extremity supported;During functional activity  Standing balance-Leahy Scale Fair  Exercises  Exercises Other exercises  Other Exercises  Other Exercises edema control using ice and elevation and wiggling R thumb, index and middle fingers as able  OT - End of Session  Equipment Utilized During Treatment Oxygen (7L)  Activity Tolerance Patient tolerated treatment well  Patient left in chair;with call bell/phone within reach  Nurse Communication Mobility status;Weight bearing status;Other (comment) (keep R hand elevated on 2-3 pillows)  OT Assessment  OT Recommendation/Assessment Patient needs continued OT Services  OT Visit Diagnosis Unsteadiness on feet (R26.81);Muscle weakness (generalized) (M62.81);Pain  Pain - Right/Left Right  Pain - part of body Hand  OT Problem List Decreased strength;Decreased range of motion;Decreased activity tolerance;Decreased knowledge of use of DME or AE;Cardiopulmonary status limiting activity;Pain;Impaired UE functional use;Increased edema  OT Plan  OT Frequency (ACUTE ONLY) Min 3X/week  OT Treatment/Interventions (ACUTE ONLY) Self-care/ADL training;Therapeutic exercise;DME and/or AE instruction;Therapeutic activities;Patient/family education  AM-PAC OT "6 Clicks" Daily Activity Outcome Measure  Help from another person eating meals? 3  Help from another person taking care of personal grooming? 3  Help from another person toileting, which includes using toliet, bedpan, or urinal? 3  Help from another person  bathing (including washing, rinsing, drying)? 3  Help from another person to put on and taking off regular upper body clothing? 3  Help from another person to put on and taking off regular lower body clothing? 2  6 Click Score 17  ADL G Code Conversion CK  OT Recommendation  Follow Up Recommendations Supervision - Intermittent;Other (comment) (follow up for R hand per Dr. Orlan Leavens)  OT Equipment 3 in 1 bedside commode  Individuals Consulted  Consulted and Agree with Results and Recommendations Patient  Acute Rehab OT Goals  Patient Stated Goal to go home  OT Goal Formulation With patient  Time For Goal Achievement 07/26/17  Potential to Achieve Goals Good  OT Time Calculation  OT Start Time (ACUTE ONLY) 0929  OT Stop Time (ACUTE ONLY) 0952  OT Time Calculation (min) 23 min  OT General Charges  $OT Visit 1 Visit  OT Evaluation  $OT Eval Moderate Complexity 1 Mod  OT Treatments  $Self Care/Home Management  8-22 mins  Written Expression  Dominant Hand Right  [facility], OT/L  [phone] 07/12/2017

## 2017-07-12 NOTE — Progress Notes (Signed)
Pharmacy Antibiotic Note  [name] is a 77 y.o. male admitted on 07/28/2017 with pneumonia.  Pharmacy has been consulted for vancomycin dosing.  Pt w/ acute on CKD.  Plan: Vancomycin 1500 IV x1 and monitor SCr prior to redosing.  Goal trough 15-20 mcg/mL.  Height: 5\' 8"  (172.7 cm) Weight: 168 lb (76.2 kg) IBW/kg (Calculated) : 68.4  Temp (24hrs), Avg:98.7 F (37.1 C), Min:98.7 F (37.1 C), Max:98.7 F (37.1 C)  Recent Labs  Lab 07/16/2017 1640 07/28/2017 1748  WBC 16.3*  --   CREATININE 7.28*  --   LATICACIDVEN  --  1.03    Estimated Creatinine Clearance: 8.2 mL/min (A) (by C-G formula based on SCr of 7.28 mg/dL (H)).    Allergies  Allergen Reactions  . 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   . Other Swelling, Rash and Other (See Comments)    Pt states that he is allergic to all -mycins.   Reaction:  Leg swelling   . Statins Other (See Comments)    Reaction:  Muscle pain   . Hydralazine Dermatitis  . Metoprolol Swelling    Edema and redness in legs  . Fish Oil Hives    Pt reports no reaction to fish (only fish oil capsules)  . Norvasc [Amlodipine] Swelling and Rash    Reaction:  Leg swelling      Thank you for allowing pharmacy to be a part of this patient's care.  Vernard Gambles, PharmD, BCPS  07/12/2017 12:35 AM

## 2017-07-12 NOTE — Progress Notes (Signed)
Brief Nutrition Note  Patient identified on the Malnutrition Screening Tool (MST) Report.   Wt Readings from Last 15 Encounters:  07/12/17 166 lb 14.2 oz (75.7 kg)  06/10/17 168 lb 12.8 oz (76.6 kg)  05/18/17 179 lb (81.2 kg)  04/27/17 166 lb 6.4 oz (75.5 kg)  01/20/17 170 lb (77.1 kg)  12/20/16 171 lb (77.6 kg)  12/13/16 175 lb (79.4 kg)  11/24/16 173 lb (78.5 kg)  11/05/16 169 lb 15.6 oz (77.1 kg)  10/13/16 178 lb (80.7 kg)  09/27/16 179 lb 0.2 oz (81.2 kg)  09/22/16 177 lb (80.3 kg)  09/08/16 170 lb (77.1 kg)  07/19/16 178 lb (80.7 kg)  05/18/16 176 lb 12.8 oz (80.2 kg)     BMI:  Body mass index is 25.38 kg/m. Patient meets criteria for overweight based on current BMI.   Diet Order:  Diet Carb Modified. Patient consumed approximately 50% of breakfast, but reports his appetite is returning as he recovers.   Discussed pt status during rounds with RN who reports pt is improving since being NPO, drinking a lot of fluids.   Pt reported weight loss over past week due to illness (3-4 lb in past week; UBW 168-170). Pt is insistent that he is feeling much better. He is eating well in the hospital. He was not eating the past week d/t illness (everything tasted like blood and he was tired); however, prior to admit illness, he had a very good appetite and had been trying to lose weight.   Cooks for wife and himself at home. Tries to eat lower-sodium foods. Eats 100% of meals.  B: Cereal with skim milk and fruit L: Sandwich  D: Pinto beans with greens (turnip/collard) OR Fish with vegetables and "chips" Beverages: V8, OJ, Sweet tea (lower sugar), coffee    No further nutrition interventions warranted at this time. Please consult RD if future nutrition needs arise.     Marjie Skiff, MS Dietetic Intern Pager: [phone]

## 2017-07-12 NOTE — Progress Notes (Signed)
[facility] PROGRESS NOTE  [name] ZOX:[id number] DOB: 09/04/1940 DOA: 07/16/2017 PCP: Lupita Raider, MD  Assessment/Plan:  Acute on chronic respiratory failure with hypoxia likely secondary to pneumonia BiPAP as needed  pulmonology who will help manage through e-link at night . CKD (chronic kidney disease), stage IV (HCC) acute on chronic failure discuss with nephrology  they recommended holding off on diuresis. Nephro following and AKI labs placed 2/12 . Essential hypertension continue current home medications Spoke to pharmacy and add clonidine PO for better control . Hyperlipidemia stable restart home medications when able . Type 2 diabetes mellitus with vascular disease (HCC) - SSI ordered checked hemoglobin A1c 5.5 . Chronic diastolic CHF (congestive heart failure) (HCC) - for now continue to hold Lasix, cardio seen at bedside, replaced order, awaiting recs . Anemia - anemia panel, we'll transfuse  1 unit, Hemoccult-positive the patient have had nose bleeding. May benefit from additional GI workup . CAP (community acquired pneumonia) - , Rocephin and azithromycin, cont duonebs, cont xopenex Await results of sputum and blood cultures . Epistaxis - currently stable hold Coumadin Elevated troponin - continue to cycle, pending echogram,  liekly due to demand . Atrial fibrillation (HCC) -           - CHA2DS2 vas score 5 : hold Coumadin due to elevated INR             - Rhythm control: Continue amiodarone  Other plan as per orders.  DVT prophylaxis:  SCD    Code Status:    DNR/DNI as per patient    Family Communication:   none available Disposition Plan:   To home once workup is complete and patient is stable                                                  Consults called: nephrology. Hand Surgery, emailed cardiology, discussed with pulmonology  Admission status:    inpatient      Level of care     SDU      Consultants:  Orthopedics  Nephrology  Procedures:  ---  Antibiotics:  2/11 Rocephin and Azithromycin-->P  HPI/Subjective: Doing well.  Objective: Vitals:   07/12/17 1600 07/12/17 1633  BP: (!) 176/80 (!) 180/80  Pulse: 78   Resp: (!) 22   Temp:  99.7 F (37.6 C)  SpO2: (!) 83% 94%    Intake/Output Summary (Last 24 hours) at 07/12/2017 1932 Last data filed at 07/12/2017 1712 Gross per 24 hour  Intake 2765.75 ml  Output 2 ml  Net 2763.75 ml   Filed Weights   07/28/2017 1639 07/01/2017 2005 07/12/17 0104  Weight: 76.2 kg (168 lb) 76.2 kg (168 lb) 75.7 kg (166 lb 14.2 oz)    Exam:   General:  NAD, NCAT  Cardiovascular: RRR, no MRG  Respiratory: CTAB, incr wob, hi glow [place]  Abdomen: BS+, ND  Musculoskeletal: moving all extr   Data Reviewed: Basic Metabolic Panel: Recent Labs  Lab 07/01/2017 1640 07/12/17 0338  NA 136 140  K 4.4 4.7  CL 107 111  CO2 16* 15*  GLUCOSE 182* 154*  BUN 70* 72*  CREATININE 7.28* 7.32*  CALCIUM 8.2* 7.8*  MG  --  1.9  PHOS  --  5.6*   Liver Function Tests: Recent Labs  Lab 07/12/17 0338  AST 28  ALT 19  ALKPHOS 83  BILITOT 1.1  PROT 5.4*  ALBUMIN 2.1*   No results for input(s): LIPASE, AMYLASE in the last 168 hours. No results for input(s): AMMONIA in the last 168 hours. CBC: Recent Labs  Lab 07/28/17 1640 07/12/17 0338 07/12/17 0937  WBC 16.3* 18.2* 19.7*  HGB 6.6* 6.4* 7.1*  HCT 21.2* 19.5* 21.6*  MCV 95.5 93.8 93.5  PLT 407* 326 365   Cardiac Enzymes: Recent Labs  Lab 2017-07-28 2242 07/12/17 0338 07/12/17 0937  TROPONINI 0.11* 0.13* 0.13*   BNP (last 3 results) Recent Labs    07-28-2017 1726  BNP 2,166.2*    ProBNP (last 3 results) Recent Labs    05/18/17 1530  PROBNP 53,271*    CBG: Recent Labs  Lab 07/12/17 0133 07/12/17 0755 07/12/17 1229 07/12/17 1631  GLUCAP 154* 115* 162* 145*    Recent Results (from the past 240 hour(s))  Blood Culture (routine x 2)     Status:  None (Preliminary result)   Collection Time: 07/28/2017  5:27 PM  Result Value Ref Range Status   Specimen Description BLOOD SITE NOT SPECIFIED  Final   Special Requests   Final    BOTTLES DRAWN AEROBIC AND ANAEROBIC Blood Culture adequate volume   Culture   Final    NO GROWTH < 24 HOURS Performed at [facility], [address] [place], [place] [address]    Report Status PENDING  Incomplete  Respiratory Panel by PCR     Status: None   Collection Time: 07-28-17  9:16 PM  Result Value Ref Range Status   Adenovirus NOT DETECTED NOT DETECTED Final   Coronavirus 229E NOT DETECTED NOT DETECTED Final   Coronavirus HKU1 NOT DETECTED NOT DETECTED Final   Coronavirus NL63 NOT DETECTED NOT DETECTED Final   Coronavirus OC43 NOT DETECTED NOT DETECTED Final   Metapneumovirus NOT DETECTED NOT DETECTED Final   Rhinovirus / Enterovirus NOT DETECTED NOT DETECTED Final   Influenza A NOT DETECTED NOT DETECTED Final   Influenza A H1 NOT DETECTED NOT DETECTED Final   Influenza A H1 2009 NOT DETECTED NOT DETECTED Final   Influenza A H3 NOT DETECTED NOT DETECTED Final   Influenza B NOT DETECTED NOT DETECTED Final   Parainfluenza Virus 1 NOT DETECTED NOT DETECTED Final   Parainfluenza Virus 2 NOT DETECTED NOT DETECTED Final   Parainfluenza Virus 3 NOT DETECTED NOT DETECTED Final   Parainfluenza Virus 4 NOT DETECTED NOT DETECTED Final   Respiratory Syncytial Virus NOT DETECTED NOT DETECTED Final   Bordetella pertussis NOT DETECTED NOT DETECTED Final   Chlamydophila pneumoniae NOT DETECTED NOT DETECTED Final   Mycoplasma pneumoniae NOT DETECTED NOT DETECTED Final    Comment: Performed at [facility], [address] [place], [place] [address]  MRSA PCR Screening     Status: None   Collection Time: 07/12/17  1:00 AM  Result Value Ref Range Status   MRSA by PCR NEGATIVE NEGATIVE Final    Comment:        The GeneXpert MRSA Assay (FDA approved for NASAL specimens only), is one component of  a comprehensive MRSA colonization surveillance program. It is not intended to diagnose MRSA infection nor to guide or monitor treatment for MRSA infections. Performed at [facility], [address] [place], [place] [address]      Studies: Dg Chest 2 View  Result Date: 2017-07-28 CLINICAL DATA:  Hemoptysis which shortness-of-breath and fever. EXAM: CHEST  2 VIEW COMPARISON:  09/23/2016 FINDINGS: Sternotomy wires unchanged. Lungs are adequately inflated demonstrate patchy hazy airspace opacification over the mid to lower lungs likely due to multifocal pneumonia, although hemorrhage could also have this appearance. Mild blunting of the right costophrenic angle unchanged. Mild stable cardiomegaly. Calcified plaque over the aortic arch. Remainder of the exam is unchanged. IMPRESSION: Hazy multifocal airspace process over the mid to lower lungs likely multifocal pneumonia, although hemorrhage could have this appearance. Minimal stable blunting of the right costophrenic angle. Stable cardiomegaly. Electronically Signed   By: Elberta Fortis M.D.   On: 07/28/2017 12:40   Dg Chest Port 1 View  Result Date: 07/19/2017 CLINICAL DATA:  Acute onset of hypoxia.  Evaluate for aspiration. EXAM: PORTABLE CHEST 1 VIEW COMPARISON:  Chest radiograph performed earlier today at 12:44 p.m. FINDINGS: There is significantly worsening bilateral airspace opacification. A small right pleural effusion is noted. This may reflect diffuse pneumonia or pulmonary edema. No pneumothorax is seen. The mediastinal silhouette is enlarged. The patient is status post median sternotomy, with evidence of prior CABG. No acute osseous abnormalities are seen. IMPRESSION: 1. Significantly worsening bilateral airspace opacification; this may reflect diffuse pneumonia or pulmonary edema. Small right pleural effusion noted. 2. Cardiomegaly. Electronically Signed   By: Roanna Raider M.D.   On: 07/12/2017 23:05   Dg Hand Complete  Right  Result Date: 07/28/2017 CLINICAL DATA:  77 year old male with a history injury to hand. Lacerations. EXAM: RIGHT HAND - COMPLETE 3+ VIEW COMPARISON:  None. FINDINGS: Acute fracture of the distal aspect of the distal phalanx fifth digit right hand. Soft tissue disruption on the dorsal aspect of the fourth digit near the distal interphalangeal joint and the proximal interphalangeal joint. No radiopaque foreign body. Soft tissue injury involving the fifth digit of the right hand, with no radiopaque foreign body. Degenerative changes of the interphalangeal joints. Extensive arterial calcifications of the right hand. IMPRESSION: Acute fracture of the distal phalanx of the fifth digit right hand with associated soft tissue disruption/injury. Evidence of soft tissue injury on the dorsal aspect of the fourth digit, right hand, overlying the DIP and PIP. Extensive calcifications of the arteries of the right hand. Electronically Signed   By: Gilmer Mor D.O.   On: 07/05/2017 17:08    Scheduled Meds: . amiodarone  200 mg Oral Daily  . [START ON 07/13/2017] cloNIDine  0.1 mg Transdermal Weekly  . ezetimibe  5 mg Oral Daily  . feeding supplement (ENSURE ENLIVE)  237 mL Oral BID BM  . guaiFENesin  600 mg Oral BID  . insulin aspart  0-5 Units Subcutaneous QHS  . insulin aspart  0-9 Units Subcutaneous TID WC  . ipratropium-albuterol  3 mL Nebulization TID  . sodium chloride flush  3 mL Intravenous Q12H   Continuous Infusions: . sodium chloride 10 mL/hr (07/12/17 1730)  . cefTRIAXone (ROCEPHIN)  IV Stopped (07/12/17 1748)  . doxycycline (VIBRAMYCIN) IV Stopped (07/12/17 1253)    Active Problems:   Essential hypertension   Hyperlipidemia   CKD (chronic kidney disease), stage IV (HCC)   Type 2 diabetes mellitus with vascular disease (HCC)   Atrial fibrillation (HCC)   Chronic diastolic CHF (congestive heart failure) (HCC)   Anemia   CAP (community acquired pneumonia)   Epistaxis    Time  spent: 67    Haydee Salter  [facility] Pager AMION. If 7PM-7AM, please contact night-coverage at [email or web address], password [facility] 07/12/2017, 7:32 PM  LOS: 1 day

## 2017-07-12 NOTE — Consult Note (Addendum)
Admit date: 07/26/2017 Referring Physician  Melynda Ripple, MD Primary Physician  Lupita Raider, MD Primary Cardiologist  Verdis Prime, MD Reason for Consultation  AFib   HPI: [name] is a 77 y/o M with AFib on Coumadin, CAD s/p CABG in 2005, diastolic HF (recent discontinuation of lasix) and CKD-IV who was sent to ED from ENT office 07/07/2017 for evaluation and treatment of hypoxia, anemia and BL opacities on CXR. Cardiology has been asked by the hospitalist service to consult regarding Atrial Fibrillation and troponin elevation.   [name] originally presented to ENT for initial evaluation of hemoptysis and recurrent epistaxis. Nasal endoscopy in office was without stigmata of recent bleed. He was hypoxic to 80's and CXR performed in clinic showed multifocal airspace disease in mid-to-lower lobes which was concerning for multifocal pneumonia vs hemorrhage. CBC in office with leukocytosis to 14,000 and Hb 6.6. INR was also supratheraputic at 4.2. He was subsequently sent to the ED for further evaluation.  On arrival to ED, BP 190/57, pulse 76 and was hypoxic 72% on RA which improved with 3L via [place]. VBG with pH 7.25, pO2 22 and pCO2 33. CMET with Cr 7.3 (Cr 5.6 57-month ago, although prior BL ~2.6), BUN 72, CO2 15, and phosphorus 5.6. BNP >2000. 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 He was taken urgently to OR with hand surgery for ORIF.  Overnight his respiratory status worsened and required 10L via NRB to maintain sats >90%. He had emesis during this time as well. CXR was repeated which showed significantly worsened BL airspace opacification from several hours prior felt to reflect diffuse pneumonia or pulmonary edema. He is continued on HFNC and pulmonology, nephrology and cardiology have been consulted.    PMH:   Past Medical History:  Diagnosis Date  . Alcohol abuse   . Arthritis    "just  about qwhere now" (08/28/2015)  . Benign prostatic hyperplasia   . CAD (coronary artery disease) 2005   mLAD ~90%, D1/RI-70, mRCA 70% --> CABG 3: LIMA-LAD, SVG-D1/RI, SVG-dRCA  . HTN (hypertension)   . Hyperlipemia   . Iron deficiency anemia   . Myocardial infarction [facility]) 1995   s/p PTCA  . Obesity   . Peripheral vascular disease (HCC)   . PONV (postoperative nausea and vomiting)   . Type II diabetes mellitus (HCC)      PSH:   Past Surgical History:  Procedure Laterality Date  . ABDOMINAL HERNIA REPAIR    . CARDIAC CATHETERIZATION  2005   mLAD ~90%, D1/RI-70, mRCA 70%   . CARDIAC CATHETERIZATION N/A 08/29/2015   Procedure: Left Heart Cath and Coronary Angiography;  Surgeon: Marykay Lex, MD;  Location: [facility] INVASIVE CV LAB;  Service: Cardiovascular;  Laterality: N/A;  . CAROTID ENDARTERECTOMY    . CORONARY ANGIOPLASTY WITH STENT PLACEMENT  12/1993; 04/1994  . CORONARY ARTERY BYPASS GRAFT  2005   CABG 3: LIMA-LAD, SVG-D1/RI, SVG-dRCA (Dr.VanTrigt)  . EYE SURGERY Left 1968   "dug hot steel out that was imbedded"   . HERNIA REPAIR    . INGUINAL HERNIA REPAIR Right   . KNEE ARTHROSCOPY Left   . KNEE CARTILAGE SURGERY Right 1960s   "took cartilage out"  . LOWER EXTREMITY ANGIOGRAPHY N/A 09/27/2016   Procedure: Lower Extremity Angiography;  Surgeon: Runell Gess, MD;  Location: [facility] INVASIVE CV LAB;  Service: Cardiovascular;  Laterality: N/A;  . LOWER EXTREMITY INTERVENTION  N/A 11/04/2016   Procedure: Lower Extremity Intervention;  Surgeon: Runell Gess, MD;  Location: [facility] INVASIVE CV LAB;  Service: Cardiovascular;  Laterality: N/A;  . SHOULDER ARTHROSCOPY W/ ROTATOR CUFF REPAIR Left   . SKIN CANCER DESTRUCTION     "6 cut off" (08/28/2015)  . UMBILICAL HERNIA REPAIR      Allergies:  Food; Other; Statins; Hydralazine; Metoprolol; Fish oil; and Norvasc [amlodipine] Prior to Admit Meds:   Medications Prior to Admission  Medication Sig Dispense Refill Last Dose  . acetaminophen  (TYLENOL) 325 MG tablet Take 2 tablets (650 mg total) by mouth every 4 (four) hours as needed for headache or mild pain.   Past Month at Unknown time  . amiodarone (PACERONE) 200 MG tablet Take 1 tablet (200 mg total) by mouth daily. 90 tablet 3 07/21/2017 at Unknown time  . aspirin EC 81 MG tablet Take 81 mg by mouth at bedtime.    07/10/2017 at Unknown time  . cloNIDine (CATAPRES - DOSED IN MG/24 HR) 0.1 mg/24hr patch Place 0.1 mg onto the skin once a week. On Wednesday   07/06/2017 at Unknown time  . Coenzyme Q10 (CO Q-10) 100 MG CAPS Take 100 mg by mouth daily.   07/05/2017 at Unknown time  . ezetimibe (ZETIA) 10 MG tablet Take 5 mg by mouth daily.   07/08/2017 at Unknown time  . ferrous sulfate 325 (65 FE) MG tablet Take 325 mg by mouth at bedtime.    07/10/2017 at Unknown time  . ibuprofen (ADVIL,MOTRIN) 200 MG tablet Take 400 mg by mouth 2 (two) times daily as needed for mild pain.   Past Month at Unknown time  . nitroGLYCERIN (NITROSTAT) 0.4 MG SL tablet Place 0.4 mg under the tongue every 5 (five) minutes as needed for chest pain.   unknown  . warfarin (COUMADIN) 2.5 MG tablet Take 1.25-2.5 mg by mouth See admin instructions. Take 1/2 tablet on Tuesday then take 1 tablet all the other days   07/03/2017 at 0930  . docusate sodium (COLACE) 50 MG capsule Take 1 capsule (50 mg total) by mouth 2 (two) times daily. (Patient not taking: Reported on 07/24/2017) 60 capsule 11 Not Taking at Unknown time  . furosemide (LASIX) 80 MG tablet Take 1 tablet (80 mg total) by mouth daily. (Patient not taking: Reported on 07/05/2017) 90 tablet 3 Not Taking at Unknown time  . warfarin (COUMADIN) 2.5 MG tablet TAKE AS DIRECTED BY COUMADIN CLINIC (Patient not taking: Reported on 07/22/2017) 30 tablet 1 Not Taking at Unknown time   Fam HX:    Family History  Problem Relation Age of Onset  . Heart attack Mother   . CAD Father    Social HX:    Social History   Socioeconomic History  . Marital status: Married    Spouse  name: Not on file  . Number of children: Not on file  . Years of education: Not on file  . Highest education level: Not on file  Social Needs  . Financial resource strain: Not on file  . Food insecurity - worry: Not on file  . Food insecurity - inability: Not on file  . Transportation needs - medical: Not on file  . Transportation needs - non-medical: Not on file  Occupational History  . Not on file  Tobacco Use  . Smoking status: Former Smoker    Packs/day: 1.50    Years: 35.00    Pack years: 52.50    Types: Cigarettes  Last attempt to quit: 01/08/1994    Years since quitting: 23.5  . Smokeless tobacco: Never Used  Substance and Sexual Activity  . Alcohol use: Yes    Alcohol/week: 16.8 oz    Types: 28 Cans of beer per week    Comment: 2 beers everyday.  . Drug use: No  . Sexual activity: Not Currently  Other Topics Concern  . Not on file  Social History Narrative  . Not on file     ROS:  All  ROS were addressed and are negative except what is stated in the HPI  Physical Exam: Blood pressure (!) 164/70, pulse 62, temperature 97.7 F (36.5 C), temperature source Oral, resp. rate (!) 21, height 5\' 8"  (1.727 m), weight 166 lb 14.2 oz (75.7 kg), SpO2 97 %.  General: Chronically-ill appearing caucasian male resting in bedside chair. Looks comfortable.  Head: PERRLA. Normal cephalic and atramatic  Lungs: Bibasilar crackles. Breathing comfortably on 8L via HFNC.   Heart:  RRR. No murmur appreciated.  Abdomen: Obese abdomen but soft with normoactive bowel sounds Extremities:  1-2+ pitting edema BL LE. No cyanosis. Right hand swollen, elevated and wrapped in clean dry bandages Neuro: Alert and oriented X 3. Able to move all extremities spontaneously.  Psych: Good affect, responds appropriately. Poor understanding of degree of renal disease  Labs: CBC Latest Ref Rng & Units 07/13/2017 07/12/2017 07/12/2017  WBC 4.0 - 10.5 K/uL 20.9(H) 19.7(H) 18.2(H)  Hemoglobin 13.0 - 17.0  g/dL 6.4(LL) 7.1(L) 6.4(LL)  Hematocrit 39.0 - 52.0 % 19.3(L) 21.6(L) 19.5(L)  Platelets 150 - 400 K/uL 342 365 326   CMP Latest Ref Rng & Units 07/13/2017 07/12/2017 07-31-17  Glucose 65 - 99 mg/dL 086(V) 784(O) 962(X)  BUN 6 - 20 mg/dL 52(W) 41(L) 24(M)  Creatinine 0.61 - 1.24 mg/dL 0.10(U) 7.25(D) 6.64(Q)  Sodium 135 - 145 mmol/L 137 140 136  Potassium 3.5 - 5.1 mmol/L 4.8 4.7 4.4  Chloride 101 - 111 mmol/L 108 111 107  CO2 22 - 32 mmol/L 15(L) 15(L) 16(L)  Calcium 8.9 - 10.3 mg/dL 8.1(L) 7.8(L) 8.2(L)  Total Protein 6.5 - 8.1 g/dL - 5.4(L) -  Total Bilirubin 0.3 - 1.2 mg/dL - 1.1 -  Alkaline Phos 38 - 126 U/L - 83 -  AST 15 - 41 U/L - 28 -  ALT 17 - 63 U/L - 19 -   No results found for: PTT Lab Results  Component Value Date   INR 3.73 07/12/2017   INR 3.16 2017/07/31   INR 2.6 06/28/2017   Lab Results  Component Value Date   TROPONINI 0.13 (HH) 07/12/2017    No results found for: CHOL No results found for: HDL No results found for: LDLCALC No results found for: TRIG No results found for: CHOLHDL No results found for: LDLDIRECT    Radiology:  Dg Chest 2 View  Result Date: 07-31-2017 CLINICAL DATA:  Hemoptysis which shortness-of-breath and fever. EXAM: CHEST  2 VIEW COMPARISON:  09/23/2016 FINDINGS: Sternotomy wires unchanged. Lungs are adequately inflated demonstrate patchy hazy airspace opacification over the mid to lower lungs likely due to multifocal pneumonia, although hemorrhage could also have this appearance. Mild blunting of the right costophrenic angle unchanged. Mild stable cardiomegaly. Calcified plaque over the aortic arch. Remainder of the exam is unchanged. IMPRESSION: Hazy multifocal airspace process over the mid to lower lungs likely multifocal pneumonia, although hemorrhage could have this appearance. Minimal stable blunting of the right costophrenic angle. Stable cardiomegaly. Electronically Signed   By:  Elberta Fortis M.D.   On: 08/02/2017 12:40   Dg Chest  Port 1 View  Result Date: 08-02-17 CLINICAL DATA:  Acute onset of hypoxia.  Evaluate for aspiration. EXAM: PORTABLE CHEST 1 VIEW COMPARISON:  Chest radiograph performed earlier today at 12:44 p.m. FINDINGS: There is significantly worsening bilateral airspace opacification. A small right pleural effusion is noted. This may reflect diffuse pneumonia or pulmonary edema. No pneumothorax is seen. The mediastinal silhouette is enlarged. The patient is status post median sternotomy, with evidence of prior CABG. No acute osseous abnormalities are seen. IMPRESSION: 1. Significantly worsening bilateral airspace opacification; this may reflect diffuse pneumonia or pulmonary edema. Small right pleural effusion noted. 2. Cardiomegaly. Electronically Signed   By: Roanna Raider M.D.   On: August 02, 2017 23:05   Dg Hand Complete Right  Result Date: 08/02/2017 CLINICAL DATA:  77 year old male with a history injury to hand. Lacerations. EXAM: RIGHT HAND - COMPLETE 3+ VIEW COMPARISON:  None. FINDINGS: Acute fracture of the distal aspect of the distal phalanx fifth digit right hand. Soft tissue disruption on the dorsal aspect of the fourth digit near the distal interphalangeal joint and the proximal interphalangeal joint. No radiopaque foreign body. Soft tissue injury involving the fifth digit of the right hand, with no radiopaque foreign body. Degenerative changes of the interphalangeal joints. Extensive arterial calcifications of the right hand. IMPRESSION: Acute fracture of the distal phalanx of the fifth digit right hand with associated soft tissue disruption/injury. Evidence of soft tissue injury on the dorsal aspect of the fourth digit, right hand, overlying the DIP and PIP. Extensive calcifications of the arteries of the right hand. Electronically Signed   By: Gilmer Mor D.O.   On: 08/02/17 17:08   Telemetry    Sinus rhythm, No RVR appreciated. - Personally Reviewed  ASSESSMENT: This is a chronically-ill 77 y/o M  with history of CAD and hx of CABG, AFib on Coumadin and Amiodarone, Diastolic HF, Advanced CKD, chronic anemia and HTN admitted 2017-08-02 with Acute Hypoxic Respiratory Failure secondary to multifocal pneumonia and volume overload. He also presented with acute on chronic anemia with Hb 6.6 and supratherapeutic INR. Course complicated by laceration and fracture of right digit s/p ORIF.   PLAN AFib with supratherapeutic INR: Takes Coumadin and Amiodarone at home and is currently rate controlled on Amiodarone. INR remains supratherapeutic at 3.7 this morning.  -Continue holding Coumadin and monitor INR  -Consider PO Vitamin K 10mg  vs IV if bleeding appreciated -Continue Amiodarone at current dose  Acute Respiratory Failure with Hypoxia, Diastolic Heart Failure, Pulmonary Edema: Patient has been taking his lasix off-and-on for the past 3 months due to worsening kidney disease. He hasn't taken lasix over the past 3 weeks and is volume overloaded. Believe large component of respiratory distress secondary to volume.  -Nephrology consulted as well -ECHO pending  Acute on Chronic Renal Failure: Follows with Dr. Hyman Hopes. Cr 7.3 on admission with BUN of 72. Most recent Cr in system 5.6 12/18 however baseline around 2.6 prior to this. Lasix was discontinued due to worsened renal failure but recently restarted. Patient states he doesn't have a problem with his kidneys as he makes a lot of urine.  -Follow nephrology recs -Patient confirmed he does not want dialysis  Multifocal Pneumonia: On Ceftriaxone and doxycycline. Flu and respiratory viral panel negative. Per primary.  Acute on Chronic Anemia, +FOBT: Transfused 2 units PRB. Hb not responding appropriately. Does have iron deficiency but also likely related to anemia of chronic disease and  acute blood loss as well. Will defer to primary/nephrology on replacement and transfusion. Would keep Hb >8.  -Per primary/nephrology -Doubt hemolysis. Bili normal on admit,  will check LFT today  HTN, Uncontrolled: He has been hypertensive during admission so far. Chart review shows difficulty with BP control at least for the past month or so with recorded readings >200's systolic. On Clonidine at home  CAD w/ hx of CABG 2005, Troponin Elevation: Patient has known CAD s/p CABG and mild troponin elevation is most likely related to increased demand. He is without chest pain and EKG does not suggest acute ischemia. I'm not sure of the appropriateness of an ischemic evaluation in this patient presently. Will follow his progress with above conditions.  -On Zetia, has hx of statin myopathy  Right hand Lacerations, partial amputation s/p ORIF R digit: Taken promptly to OR after admission for repair. Will need follow-up in 7-10 days for wound check and likely suture removal. Per primary.  For questions or updates, please contact [facility] Please consult [email or web address] for contact info under Cardiology/STEMI.   Bethany Molt, DO  07/12/2017  8:59 AM  I have seen and examined the patient along with Bethany Molt, DO.  I have reviewed the chart, notes and new data.  I agree with her note.  Key new complaints: denies chest pain, only complaint is dyspnea/orthopnea. Cough has subsided Key examination changes: bilateral rales, +JVD, ?S3 gallop. 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  The situation is however more complicated and his pulmonary infiltrates and respiratory failure are likely multifactorial. Consider superimposed infection or vasculitis. Workup so far not revealing. Diuresis may worsen already serious kidney failure.  Fortunately, he is not oliguric at this point. He is firm in his decision not to undergo hemodialysis, even temporarily. Echo findings are consistent with new regional ischemia. Will look at full echo when completed and 2017 study as well. Obviously, he is not a candidate for angiography with recent severe bleeding and acute renal failure. Mild increase in troponin with flat pattern is not indicative of acute coronary event, but does portend poor prognosis. Keep Hgb above 7, transfuse as necessary. In NSR, appropriate to keep off anticoagulation.  Edward Fair, MD, [facility] [phone] 07/13/2017, 9:35 AM

## 2017-07-12 NOTE — Consult Note (Signed)
Reason for Consult: AKI/CKD Referring Physician:  Melynda Ripple, MD  [name] is an 77 y.o. male.  HPI: [name] is a 77yo WM with multiple medical problems most notable for HTN, DM, PVD (s/p angiogram and rotational atherectomy PTA of his bilateral common iliac arteries on 11/04/16), CAD s/p CABG, CKD stage 3 (baseline Scr 1.23-2.5), atrial fib on coumadin, and diastolic CHF who was admitted on 07/26/2017 after he developed epistaxis and hemoptysis and CXR revealed multi-focal infiltrates (hemorrhage vs pneumonia) and ABLA with Hgb drop from 14 to 6.6.  He was in the ED waiting room to see ENT when his fingers caught in the spokes of his wheelchair resulting in partial amputation of right fifth digit.  His course was further complicated by hypoxia and placed on oxygen.  He had emergent surgical repair of his hand/finger injury.  He was also found to have worsening SOB and decompensated CHF as well as poorly controlled HTN and AKI/CKD.  We were consulted to further evaluate his progressive CKD.  His trend in Scr is seen below.    His Scr has continued to rise since June 2018 and has had negative renal US for obstruction in November and December.  His lasix dose had been stopped in January by his PCP but restarted by Cardiology after he had worsening edema, however the decline of his renal function has markedly accelerated over the last 2 months.  He denies any nephrotoxic exposure since his angiogram in June 2018.  No NSAIDs/Cox-II I's.  He denies any anorexia or malaise.  Trend in Creatinine: Creatinine, Ser  Date/Time Value Ref Range Status  07/12/2017 03:38 AM 7.32 (H) 0.61 - 1.24 mg/dL Final  16/03/9603 54:09 PM 7.28 (H) 0.61 - 1.24 mg/dL Final  81/19/1478 29:56 AM 5.69 (H) 0.76 - 1.27 mg/dL Final  21/30/8657 84:69 AM 3.07 (H) 0.76 - 1.27 mg/dL Final  62/95/2841 32:44 AM 2.64 (H) 0.61 - 1.24 mg/dL Final  06/02/7251 66:44 AM 2.71 (H) 0.61 - 1.24 mg/dL Final  03/47/4259 56:38 PM 2.62 (H) 0.61 - 1.24  mg/dL Final  75/64/3329 51:88 AM 2.46 (H) 0.61 - 1.24 mg/dL Final  41/66/0630 16:01 PM 2.37 (H) 0.61 - 1.24 mg/dL Final  09/32/3557 32:20 PM 1.44 (H) 0.61 - 1.24 mg/dL Final  25/42/7062 37:62 AM 2.19 (H) 0.61 - 1.24 mg/dL Final  83/15/1761 60:73 AM 1.06 0.61 - 1.24 mg/dL Final  71/10/2692 85:46 AM 0.93 0.61 - 1.24 mg/dL Final  27/07/5007 38:18 AM 1.03 0.61 - 1.24 mg/dL Final  29/93/7169 67:89 AM 1.05 0.61 - 1.24 mg/dL Final  38/02/1750 02:58 PM 1.19 0.61 - 1.24 mg/dL Final  52/77/8242 35:36 PM 0.96 0.40 - 1.50 mg/dL Final  14/43/1540 08:67 AM 0.81 0.40 - 1.50 mg/dL Final  61/95/0932 67:12 AM 0.85 0.40 - 1.50 mg/dL Final  45/80/9983 38:25 AM 0.89 0.40 - 1.50 mg/dL Final  05/39/7673 41:93 AM 0.73 0.40 - 1.50 mg/dL Final  79/07/4095 35:32 AM 0.67 0.40 - 1.50 mg/dL Final  99/24/2683 41:96 AM 0.79 0.40 - 1.50 mg/dL Final  22/29/7989 21:19 AM 0.82 0.40 - 1.50 mg/dL Final  41/74/0814 48:18 AM 0.82 0.40 - 1.50 mg/dL Final  56/31/4970 26:37 AM 0.8 0.4 - 1.5 mg/dL Final    PMH:   Past Medical History:  Diagnosis Date  . Alcohol abuse   . Arthritis    "just about qwhere now" (08/28/2015)  . Benign prostatic hyperplasia   . CAD (coronary artery disease) 2005   mLAD ~90%, D1/RI-70, mRCA 70% --> CABG  3: LIMA-LAD, SVG-D1/RI, SVG-dRCA  . HTN (hypertension)   . Hyperlipemia   . Iron deficiency anemia   . Myocardial infarction [facility]) 1995   s/p PTCA  . Obesity   . Peripheral vascular disease (HCC)   . PONV (postoperative nausea and vomiting)   . Type II diabetes mellitus (HCC)     PSH:   Past Surgical History:  Procedure Laterality Date  . ABDOMINAL HERNIA REPAIR    . CARDIAC CATHETERIZATION  2005   mLAD ~90%, D1/RI-70, mRCA 70%   . CARDIAC CATHETERIZATION N/A 08/29/2015   Procedure: Left Heart Cath and Coronary Angiography;  Surgeon: Marykay Lex, MD;  Location: [facility] INVASIVE CV LAB;  Service: Cardiovascular;  Laterality: N/A;  . CAROTID ENDARTERECTOMY    . CORONARY ANGIOPLASTY WITH  STENT PLACEMENT  12/1993; 04/1994  . CORONARY ARTERY BYPASS GRAFT  2005   CABG 3: LIMA-LAD, SVG-D1/RI, SVG-dRCA (Dr.VanTrigt)  . EYE SURGERY Left 1968   "dug hot steel out that was imbedded"   . HERNIA REPAIR    . INGUINAL HERNIA REPAIR Right   . KNEE ARTHROSCOPY Left   . KNEE CARTILAGE SURGERY Right 1960s   "took cartilage out"  . LOWER EXTREMITY ANGIOGRAPHY N/A 09/27/2016   Procedure: Lower Extremity Angiography;  Surgeon: Runell Gess, MD;  Location: [facility] INVASIVE CV LAB;  Service: Cardiovascular;  Laterality: N/A;  . LOWER EXTREMITY INTERVENTION N/A 11/04/2016   Procedure: Lower Extremity Intervention;  Surgeon: Runell Gess, MD;  Location: [facility] INVASIVE CV LAB;  Service: Cardiovascular;  Laterality: N/A;  . SHOULDER ARTHROSCOPY W/ ROTATOR CUFF REPAIR Left   . SKIN CANCER DESTRUCTION     "6 cut off" (08/28/2015)  . UMBILICAL HERNIA REPAIR      Allergies:  Allergies  Allergen Reactions  . 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   . Other Swelling, Rash and Other (See Comments)    Pt states that he is allergic to all -mycins.   Reaction:  Leg swelling   . Statins Other (See Comments)    Reaction:  Muscle pain   . Hydralazine Dermatitis  . Metoprolol Swelling    Edema and redness in legs  . Fish Oil Hives    Pt reports no reaction to fish (only fish oil capsules)  . Norvasc [Amlodipine] Swelling and Rash    Reaction:  Leg swelling     Medications:   Prior to Admission medications   Medication Sig Start Date End Date Taking? Authorizing Provider  acetaminophen (TYLENOL) 325 MG tablet Take 2 tablets (650 mg total) by mouth every 4 (four) hours as needed for headache or mild pain. 09/28/16  Yes Kilroy, Eda Paschal, PA-C  amiodarone (PACERONE) 200 MG tablet Take 1 tablet (200 mg total) by mouth daily. 05/18/17  Yes Lyn Records, MD  aspirin EC 81 MG tablet Take 81 mg by mouth at bedtime.    Yes [provider]   cloNIDine (CATAPRES - DOSED IN MG/24 HR) 0.1 mg/24hr patch Place 0.1 mg onto the skin once a week. On Wednesday   Yes [provider]  Coenzyme Q10 (CO Q-10) 100 MG CAPS Take 100 mg by mouth daily.   Yes [provider]  ezetimibe (ZETIA) 10 MG tablet Take 5 mg by mouth daily.   Yes [provider]  ferrous sulfate 325 (65 FE) MG tablet Take 325 mg by mouth at bedtime.    Yes [provider]  ibuprofen (ADVIL,MOTRIN) 200 MG tablet Take 400 mg by mouth 2 (two) times daily as needed for mild pain.   Yes [provider]  nitroGLYCERIN (NITROSTAT) 0.4 MG SL tablet Place 0.4 mg under the tongue every 5 (five) minutes as needed for chest pain.   Yes [provider]  warfarin (COUMADIN) 2.5 MG tablet Take 1.25-2.5 mg by mouth See admin instructions. Take 1/2 tablet on Tuesday then take 1 tablet all the other days   Yes [provider]  docusate sodium (COLACE) 50 MG capsule Take 1 capsule (50 mg total) by mouth 2 (two) times daily. Patient not taking: Reported on 07/24/2017 01/20/17 01/15/18  Leone Brand, NP  furosemide (LASIX) 80 MG tablet Take 1 tablet (80 mg total) by mouth daily. Patient not taking: Reported on 07/04/2017 05/19/17 05/14/18  Lyn Records, MD  warfarin (COUMADIN) 2.5 MG tablet TAKE AS DIRECTED BY COUMADIN CLINIC Patient not taking: Reported on 07/23/2017 05/30/17   Lyn Records, MD    Inpatient medications: . amiodarone  200 mg Oral Daily  . [START ON 07/13/2017] cloNIDine  0.1 mg Transdermal Weekly  . ezetimibe  5 mg Oral Daily  . feeding supplement (ENSURE ENLIVE)  237 mL Oral BID BM  . guaiFENesin  600 mg Oral BID  . insulin aspart  0-5 Units Subcutaneous QHS  . insulin aspart  0-9 Units Subcutaneous TID WC  . ipratropium-albuterol  3 mL Nebulization Q6H  . sodium chloride flush  3 mL Intravenous Q12H    Discontinued Meds:   Medications Discontinued During This Encounter  Medication Reason  . tetanus &  diphtheria toxoids (adult) (TENIVAC) injection 0.5 mL   . lidocaine (XYLOCAINE) 1 % (with pres) injection Patient Discharge  . bupivacaine (PF) (MARCAINE) 0.25 % injection Patient Discharge  . oxyCODONE (Oxy IR/ROXICODONE) immediate release tablet 5 mg Patient Transfer  . oxyCODONE (ROXICODONE) 5 MG/5ML solution 5 mg Patient Transfer  . ondansetron (ZOFRAN) injection 4 mg Patient Transfer  . fentaNYL (SUBLIMAZE) injection 25-50 mcg Patient Transfer  . 0.9 %  sodium chloride infusion   . azithromycin (ZITHROMAX) 500 mg in sodium chloride 0.9 % 250 mL IVPB   . polyvinyl alcohol (ARTIFICIAL TEARS) 1.4 % ophthalmic solution No longer needed (for PRN medications)  . Oxymetazoline HCl (NASAL SPRAY NA) No longer needed (for PRN medications)  . Bacitracin-Polymyxin B (POLYSPORIN EX) No longer needed (for PRN medications)  . ondansetron (ZOFRAN) injection 4 mg Duplicate  . vancomycin (VANCOCIN) 1,500 mg in sodium chloride 0.9 % 500 mL IVPB   . azithromycin (ZITHROMAX) 500 mg in sodium chloride 0.9 % 250 mL IVPB     Social History:  reports that he quit smoking about 23 years ago. His smoking use included cigarettes. He has a 52.50 pack-year smoking history. he has never used smokeless tobacco. He reports that he drinks about 16.8 oz of alcohol per week. He reports that he does not use drugs.  Family History:   Family History  Problem Relation Age of Onset  . Heart attack Mother   . CAD Father     Pertinent items are noted in HPI. Weight change:   Intake/Output Summary (Last 24 hours) at 07/12/2017 1328 Last data filed at 07/12/2017 0919 Gross per 24 hour  Intake 2313.75 ml  Output 2 ml  Net 2311.75 ml   BP (!) 162/63 (BP Location: Right Arm)   Pulse 73   Temp 97.7 F (36.5 C) (Oral)   Resp (!) 21  Ht 5\' 8"  (1.727 m)   Wt 75.7 kg (166 lb 14.2 oz)   SpO2 94%   BMI 25.38 kg/m  Vitals:   07/12/17 1000 07/12/17 1100 07/12/17 1200 07/12/17 1229  BP: (!) 167/58 (!) 166/59 (!) 134/92  (!) 162/63  Pulse: 63 69 64 73  Resp: 20 18 20  (!) 21  Temp:    97.7 F (36.5 C)  TempSrc:    Oral  SpO2: 97% 95% 99% 94%  Weight:      Height:         General appearance: alert, cooperative and no distress Head: Normocephalic, without obvious abnormality, atraumatic Eyes: negative findings: lids and lashes normal, conjunctivae and sclerae normal and corneas clear Resp: fine crackles bilaterally Cardio: regular rate and rhythm and no rub GI: soft, non-tender; bowel sounds normal; no masses,  no organomegaly Extremities: edema 1+ lower extremity   Labs: Basic Metabolic Panel: Recent Labs  Lab 07-24-17 1640 07/12/17 0338  NA 136 140  K 4.4 4.7  CL 107 111  CO2 16* 15*  GLUCOSE 182* 154*  BUN 70* 72*  CREATININE 7.28* 7.32*  ALBUMIN  --  2.1*  CALCIUM 8.2* 7.8*  PHOS  --  5.6*   Liver Function Tests: Recent Labs  Lab 07/12/17 0338  AST 28  ALT 19  ALKPHOS 83  BILITOT 1.1  PROT 5.4*  ALBUMIN 2.1*   No results for input(s): LIPASE, AMYLASE in the last 168 hours. No results for input(s): AMMONIA in the last 168 hours. CBC: Recent Labs  Lab July 24, 2017 1640 07/12/17 0338 07/12/17 0937  WBC 16.3* 18.2* 19.7*  HGB 6.6* 6.4* 7.1*  HCT 21.2* 19.5* 21.6*  MCV 95.5 93.8 93.5  PLT 407* 326 365   PT/INR: @LABRCNTIP (inr:5) Cardiac Enzymes: ) Recent Labs  Lab 07-24-2017 2242 07/12/17 0338 07/12/17 0937  TROPONINI 0.11* 0.13* 0.13*   CBG: Recent Labs  Lab 07/12/17 0133 07/12/17 0755 07/12/17 1229  GLUCAP 154* 115* 162*    Iron Studies:  Recent Labs  Lab 07/24/2017 2242  IRON 21*  TIBC 234*  FERRITIN 770*    Xrays/Other Studies: Dg Chest 2 View  Result Date: 07-24-17 CLINICAL DATA:  Hemoptysis which shortness-of-breath and fever. EXAM: CHEST  2 VIEW COMPARISON:  09/23/2016 FINDINGS: Sternotomy wires unchanged. Lungs are adequately inflated demonstrate patchy hazy airspace opacification over the mid to lower lungs likely due to multifocal pneumonia,  although hemorrhage could also have this appearance. Mild blunting of the right costophrenic angle unchanged. Mild stable cardiomegaly. Calcified plaque over the aortic arch. Remainder of the exam is unchanged. IMPRESSION: Hazy multifocal airspace process over the mid to lower lungs likely multifocal pneumonia, although hemorrhage could have this appearance. Minimal stable blunting of the right costophrenic angle. Stable cardiomegaly. Electronically Signed   By: Elberta Fortis M.D.   On: 2017-07-24 12:40   Dg Chest Port 1 View  Result Date: July 24, 2017 CLINICAL DATA:  Acute onset of hypoxia.  Evaluate for aspiration. EXAM: PORTABLE CHEST 1 VIEW COMPARISON:  Chest radiograph performed earlier today at 12:44 p.m. FINDINGS: There is significantly worsening bilateral airspace opacification. A small right pleural effusion is noted. This may reflect diffuse pneumonia or pulmonary edema. No pneumothorax is seen. The mediastinal silhouette is enlarged. The patient is status post median sternotomy, with evidence of prior CABG. No acute osseous abnormalities are seen. IMPRESSION: 1. Significantly worsening bilateral airspace opacification; this may reflect diffuse pneumonia or pulmonary edema. Small right pleural effusion noted. 2. Cardiomegaly. Electronically Signed   By:  Roanna Raider M.D.   On: 07/24/2017 23:05   Dg Hand Complete Right  Result Date: 07/23/2017 CLINICAL DATA:  77 year old male with a history injury to hand. Lacerations. EXAM: RIGHT HAND - COMPLETE 3+ VIEW COMPARISON:  None. FINDINGS: Acute fracture of the distal aspect of the distal phalanx fifth digit right hand. Soft tissue disruption on the dorsal aspect of the fourth digit near the distal interphalangeal joint and the proximal interphalangeal joint. No radiopaque foreign body. Soft tissue injury involving the fifth digit of the right hand, with no radiopaque foreign body. Degenerative changes of the interphalangeal joints. Extensive arterial  calcifications of the right hand. IMPRESSION: Acute fracture of the distal phalanx of the fifth digit right hand with associated soft tissue disruption/injury. Evidence of soft tissue injury on the dorsal aspect of the fourth digit, right hand, overlying the DIP and PIP. Extensive calcifications of the arteries of the right hand. Electronically Signed   By: Gilmer Mor D.O.   On: 07/03/2017 17:08     Assessment/Plan: 1.  Progressive CKD- accelerated rise in Scr over the last 6 months and more recent accelerated HTN.  DDx RAS (has history of ASCVD), atheroembolic disease (although denies any embolic changes to his toes following lower ext angiogram), acute GN/RPGN.  Will order renal artery duplex and some serologies.  His primary Nephrologist, Dr. Hyman Hopes was kind enough to speak with Mr and Mrs. [name] regarding his worsening renal function and [name] was clear that he was not interested in dialysis and understood the consequences but is willing to have other studies to be done to see if this is reversible.  He is currently without uremic symptoms. 2. Epistaxis- possibly related to accelerated HTN.  ENT consulted 3. Accelerated HTN- as above.   He has multiple drug intolerances.  Continue with his outpatient regimen and agree with starting catapress patch.  Will order renal artery duplex to r/o RAS (had negative CT angio in 2008 for renal artery stenosis). 4. ABLA- likely due to epistaxis although will r/o acute GN 5. Acute on chronic diastolic CHF- lasix on hold for now but may need to resume for volume 6. CAP- on rocephin and azithromycin 7. A fib on coumadin- currently on hold due to epistaxis and ABLA 8. Elevated troponin- Cardiology has been consulted.  9. 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  10. Disposition- if pt's renal function continues to deteriorate, will need to consult hospice services.   Jomarie Longs A Ayeshia Coppin 07/12/2017, 1:28 PM

## 2017-07-12 NOTE — Progress Notes (Signed)
Pts troponin came back @ 0.13, up from 0.11. Pt asymptomatic, NAD, resting comfortably. On-call provider C. Lowell Guitar, MD notified. Pts PRBCs finished infusing. Pt asymptomatic, NAD. Mikeal Hawthorne, MD notified, orders placed for 0600 labs. Will continue to monitor. Caswell Corwin, RN 07/12/17 5:53 AM

## 2017-07-12 NOTE — ED Notes (Signed)
MD notified (Dr. Adela Glimpse) of critical troponin lab (0.11).

## 2017-07-13 ENCOUNTER — Inpatient Hospital Stay (HOSPITAL_COMMUNITY): Payer: Medicare Other

## 2017-07-13 DIAGNOSIS — E1159 Type 2 diabetes mellitus with other circulatory complications: Secondary | ICD-10-CM

## 2017-07-13 DIAGNOSIS — R040 Epistaxis: Secondary | ICD-10-CM

## 2017-07-13 DIAGNOSIS — I480 Paroxysmal atrial fibrillation: Secondary | ICD-10-CM

## 2017-07-13 DIAGNOSIS — N184 Chronic kidney disease, stage 4 (severe): Secondary | ICD-10-CM

## 2017-07-13 DIAGNOSIS — I10 Essential (primary) hypertension: Secondary | ICD-10-CM

## 2017-07-13 DIAGNOSIS — I160 Hypertensive urgency: Secondary | ICD-10-CM

## 2017-07-13 DIAGNOSIS — D62 Acute posthemorrhagic anemia: Secondary | ICD-10-CM

## 2017-07-13 DIAGNOSIS — E7849 Other hyperlipidemia: Secondary | ICD-10-CM

## 2017-07-13 DIAGNOSIS — I5033 Acute on chronic diastolic (congestive) heart failure: Secondary | ICD-10-CM

## 2017-07-13 DIAGNOSIS — I739 Peripheral vascular disease, unspecified: Secondary | ICD-10-CM

## 2017-07-13 DIAGNOSIS — N179 Acute kidney failure, unspecified: Secondary | ICD-10-CM

## 2017-07-13 DIAGNOSIS — I361 Nonrheumatic tricuspid (valve) insufficiency: Secondary | ICD-10-CM

## 2017-07-13 DIAGNOSIS — I2510 Atherosclerotic heart disease of native coronary artery without angina pectoris: Secondary | ICD-10-CM

## 2017-07-13 DIAGNOSIS — I340 Nonrheumatic mitral (valve) insufficiency: Secondary | ICD-10-CM

## 2017-07-13 LAB — URINE CULTURE
CULTURE: NO GROWTH
Special Requests: NORMAL

## 2017-07-13 LAB — MPO/PR-3 (ANCA) ANTIBODIES: ANCA Proteinase 3: <3.5 U/mL (ref 0.0–3.5)

## 2017-07-13 LAB — RENAL FUNCTION PANEL
ALBUMIN: 2 g/dL — AB (ref 3.5–5.0)
Anion gap: 14 (ref 5–15)
BUN: 78 mg/dL — AB (ref 6–20)
CALCIUM: 8.1 mg/dL — AB (ref 8.9–10.3)
CO2: 15 mmol/L — ABNORMAL LOW (ref 22–32)
Chloride: 108 mmol/L (ref 101–111)
Creatinine, Ser: 7.52 mg/dL — ABNORMAL HIGH (ref 0.61–1.24)
GFR calc Af Amer: 7 mL/min — ABNORMAL LOW (ref >60)
GFR calc non Af Amer: 6 mL/min — ABNORMAL LOW (ref >60)
GLUCOSE: 125 mg/dL — AB (ref 65–99)
PHOSPHORUS: 5.8 mg/dL — AB (ref 2.5–4.6)
Potassium: 4.8 mmol/L (ref 3.5–5.1)
SODIUM: 137 mmol/L (ref 135–145)

## 2017-07-13 LAB — ANTISTREPTOLYSIN O TITER

## 2017-07-13 LAB — CBC
HCT: 19.3 % — ABNORMAL LOW (ref 39.0–52.0)
HEMATOCRIT: 23 % — AB (ref 39.0–52.0)
Hemoglobin: 6.4 g/dL — CL (ref 13.0–17.0)
Hemoglobin: 7.6 g/dL — ABNORMAL LOW (ref 13.0–17.0)
MCH: 31.1 pg (ref 26.0–34.0)
MCH: 30.4 pg (ref 26.0–34.0)
MCHC: 33.2 g/dL (ref 30.0–36.0)
MCHC: 33 g/dL (ref 30.0–36.0)
MCV: 93.7 fL (ref 78.0–100.0)
MCV: 92 fL (ref 78.0–100.0)
PLATELETS: 324 10*3/uL (ref 150–400)
Platelets: 342 10*3/uL (ref 150–400)
RBC: 2.06 MIL/uL — ABNORMAL LOW (ref 4.22–5.81)
RBC: 2.5 MIL/uL — ABNORMAL LOW (ref 4.22–5.81)
RDW: 15 % (ref 11.5–15.5)
RDW: 15.1 % (ref 11.5–15.5)
WBC: 20.9 10*3/uL — ABNORMAL HIGH (ref 4.0–10.5)
WBC: 18.1 10*3/uL — ABNORMAL HIGH (ref 4.0–10.5)

## 2017-07-13 LAB — COMPLEMENT, TOTAL

## 2017-07-13 LAB — HEPATIC FUNCTION PANEL
ALT: 21 U/L (ref 17–63)
AST: 35 U/L (ref 15–41)
Albumin: 2.1 g/dL — ABNORMAL LOW (ref 3.5–5.0)
Alkaline Phosphatase: 95 U/L (ref 38–126)
BILIRUBIN DIRECT: 1.1 mg/dL — AB (ref 0.1–0.5)
BILIRUBIN TOTAL: 2 mg/dL — AB (ref 0.3–1.2)
Indirect Bilirubin: 0.9 mg/dL (ref 0.3–0.9)
Total Protein: 5.6 g/dL — ABNORMAL LOW (ref 6.5–8.1)

## 2017-07-13 LAB — ECHOCARDIOGRAM COMPLETE
Height: 68 in
Weight: 2811.31 oz

## 2017-07-13 LAB — C3 COMPLEMENT: C3 Complement: 143 mg/dL (ref 82–167)

## 2017-07-13 LAB — GLUCOSE, CAPILLARY
Glucose-Capillary: 110 mg/dL — ABNORMAL HIGH (ref 65–99)
Glucose-Capillary: 150 mg/dL — ABNORMAL HIGH (ref 65–99)
Glucose-Capillary: 115 mg/dL — ABNORMAL HIGH (ref 65–99)
Glucose-Capillary: 116 mg/dL — ABNORMAL HIGH (ref 65–99)

## 2017-07-13 LAB — C4 COMPLEMENT: Complement C4, Body Fluid: 18 mg/dL (ref 14–44)

## 2017-07-13 LAB — LEGIONELLA PNEUMOPHILA SEROGP 1 UR AG: L. pneumophila Serogp 1 Ur Ag: NEGATIVE

## 2017-07-13 LAB — HEMOGLOBIN AND HEMATOCRIT, BLOOD
HCT: 22.4 % — ABNORMAL LOW (ref 39.0–52.0)
HEMOGLOBIN: 7.5 g/dL — AB (ref 13.0–17.0)

## 2017-07-13 LAB — ANTI-DNA ANTIBODY, DOUBLE-STRANDED: ds DNA Ab: 2 IU/mL (ref 0–9)

## 2017-07-13 LAB — PROTIME-INR
INR: 5.38
Prothrombin Time: 48.8 seconds — ABNORMAL HIGH (ref 11.4–15.2)

## 2017-07-13 LAB — PREPARE RBC (CROSSMATCH)

## 2017-07-13 MED ORDER — FUROSEMIDE 10 MG/ML IJ SOLN
80.0000 mg | Freq: Two times a day (BID) | INTRAMUSCULAR | Status: DC
  Administered 2017-07-13 – 2017-07-15 (×5): 80 mg via INTRAVENOUS
  Filled 2017-07-13 (×5): qty 8

## 2017-07-13 MED ORDER — PHYTONADIONE 5 MG PO TABS
5.0000 mg | ORAL_TABLET | Freq: Once | ORAL | Status: AC
  Administered 2017-07-13: 5 mg via ORAL
  Filled 2017-07-13: qty 1

## 2017-07-13 MED ORDER — SODIUM CHLORIDE 0.9 % IV SOLN: Freq: Once | INTRAVENOUS | Status: DC

## 2017-07-13 NOTE — Progress Notes (Signed)
Patient is alert and oriented, pleasant and cooperative.  Right hand swollen, propped on pillow above heart level to reduce swelling.  Patient denies pain.  Patient was OOB until 8:30 pm then this nurse assisted him back to bed without difficulty.  Antibiotic therapy continues and no adverse reactions noted.  Patient resting peacefully at this time.  Safety and comfort measures maintained.  Call bell within reach.  Will continue to monitor.

## 2017-07-13 NOTE — Anesthesia Postprocedure Evaluation (Signed)
Anesthesia Post Note  Patient: [name]  Procedure(s) Performed: OPEN REDUCTION INTERNAL FIXATION (ORIF) RIGHT FORTH METACARPAL (Right )     Patient location during evaluation: PACU Anesthesia Type: MAC Level of consciousness: awake and alert Pain management: pain level controlled Vital Signs Assessment: post-procedure vital signs reviewed and stable Respiratory status: spontaneous breathing, nonlabored ventilation, respiratory function stable and patient connected to nasal cannula oxygen Cardiovascular status: stable and blood pressure returned to baseline Postop Assessment: no apparent nausea or vomiting Anesthetic complications: no    Last Vitals:  Vitals:   07/13/17 0746 07/13/17 0830  BP: (!) 169/72 (!) 179/66  Pulse:  72  Resp:  (!) 27  Temp: 36.7 C 36.9 C  SpO2:  90%    Last Pain:  Vitals:   07/13/17 0830  TempSrc: Oral  PainSc:                  [place]

## 2017-07-13 NOTE — Progress Notes (Signed)
Patient continues to mouth breath which leads to desaturations.  Patient refuses Bipap/Cpap mask.  States, "I am clastrofobic." Patient educated and reminded to take deep breaths through his nose.  Will continue to monitor patient.  Safety and comfort measures maintained.  Call bell within reach.  Doxy infusing per order via left hand PIV.  Tolerating antibiotic well, no adverse effect noted.

## 2017-07-13 NOTE — Progress Notes (Signed)
PT Cancellation Note  Patient Details Name: [name] MRN: [id number] DOB: Sep 06, 1940   Cancelled Treatment:    Reason Eval/Treat Not Completed: Other (comment)(Refused as he has had tests and not feeling well. )   Berline Lopes 07/13/2017, 1:18 PM  [facility] : [facility] [phone] (pager)

## 2017-07-13 NOTE — Progress Notes (Signed)
CRITICAL VALUE ALERT  Critical Value:  Hemoglobin 6.4  Date & Time Notied:  0522 07/13/2017  Provider Notified: [facility]ad  Orders Received/Actions taken: pending

## 2017-07-13 NOTE — Progress Notes (Signed)
CRITICAL VALUE ALERT  Critical Value: Hemoglobin 7.5, INR 5.38  Date & Time Notied:  07/13/17 1249  Provider Notified: Dr. Waymon Amato  Orders Received/Actions taken:

## 2017-07-13 NOTE — Progress Notes (Signed)
Progress Note  Patient Name: [name] Date of Encounter: 07/13/2017  Primary Cardiologist: Lesleigh Noe, MD   Subjective   No chest pain. Complains of dyspnea and dry mouth.   Inpatient Medications    Scheduled Meds: . amiodarone  200 mg Oral Daily  . cloNIDine  0.1 mg Transdermal Weekly  . cloNIDine  0.1 mg Oral BID  . ezetimibe  5 mg Oral Daily  . feeding supplement (ENSURE ENLIVE)  237 mL Oral BID BM  . guaiFENesin  600 mg Oral BID  . insulin aspart  0-5 Units Subcutaneous QHS  . insulin aspart  0-9 Units Subcutaneous TID WC  . ipratropium-albuterol  3 mL Nebulization TID  . sodium chloride flush  3 mL Intravenous Q12H   Continuous Infusions: . sodium chloride 10 mL/hr (07/12/17 1730)  . sodium chloride    . cefTRIAXone (ROCEPHIN)  IV Stopped (07/12/17 1748)  . doxycycline (VIBRAMYCIN) IV 100 mg (07/13/17 0843)   PRN Meds: sodium chloride, acetaminophen **OR** acetaminophen, HYDROcodone-acetaminophen, levalbuterol, ondansetron **OR** ondansetron (ZOFRAN) IV, sodium chloride, sodium chloride flush   Vital Signs    Vitals:   07/13/17 0625 07/13/17 0644 07/13/17 0746 07/13/17 0830  BP: (!) 162/64  (!) 169/72 (!) 179/66  Pulse: 71   72  Resp: (!) 24   (!) 27  Temp: 98.7 F (37.1 C) 98.3 F (36.8 C) 98 F (36.7 C) 98.5 F (36.9 C)  TempSrc: Oral Oral Oral Oral  SpO2:    90%  Weight:      Height:        Intake/Output Summary (Last 24 hours) at 07/13/2017 0858 Last data filed at 07/13/2017 0830 Gross per 24 hour  Intake 2267.65 ml  Output 1350 ml  Net 917.65 ml   Filed Weights   07/10/2017 2005 07/12/17 0104 07/13/17 0107  Weight: 168 lb (76.2 kg) 166 lb 14.2 oz (75.7 kg) 175 lb 11.3 oz (79.7 kg)    Telemetry    NSR - Personally Reviewed  ECG    NSR - Personally Reviewed  Physical Exam   GEN: No acute distress.   Neck: elevated JVD Cardiac: RRR, no murmurs, rubs, or gallops.  Respiratory: bibasilar crackles. GI: Soft, nontender,  non-distended  MS: No edema; No deformity. Neuro:  Nonfocal  Psych: Normal affect   Labs    Chemistry Recent Labs  Lab 07/03/2017 1640 07/12/17 0338 07/13/17 0417  NA 136 140 137  K 4.4 4.7 4.8  CL 107 111 108  CO2 16* 15* 15*  GLUCOSE 182* 154* 125*  BUN 70* 72* 78*  CREATININE 7.28* 7.32* 7.52*  CALCIUM 8.2* 7.8* 8.1*  PROT  --  5.4*  --   ALBUMIN  --  2.1* 2.0*  AST  --  28  --   ALT  --  19  --   ALKPHOS  --  83  --   BILITOT  --  1.1  --   GFRNONAA 6* 6* 6*  GFRAA 7* 7* 7*  ANIONGAP 13 14 14      Hematology Recent Labs  Lab 07/12/17 0338 07/12/17 0937 07/13/17 0417  WBC 18.2* 19.7* 20.9*  RBC 2.08* 2.31* 2.06*  HGB 6.4* 7.1* 6.4*  HCT 19.5* 21.6* 19.3*  MCV 93.8 93.5 93.7  MCH 30.8 30.7 31.1  MCHC 32.8 32.9 33.2  RDW 14.2 15.0 15.0  PLT 326 365 342    Cardiac Enzymes Recent Labs  Lab 07/04/2017 2242 07/12/17 0338 07/12/17 0937  TROPONINI 0.11* 0.13*  0.13*    Recent Labs  Lab 2017/07/16 1712  TROPIPOC 0.12*     BNP Recent Labs  Lab 2017/07/16 1726  BNP 2,166.2*     DDimer No results for input(s): DDIMER in the last 168 hours.   Radiology    Dg Chest 2 View  Result Date: Jul 16, 2017 CLINICAL DATA:  Hemoptysis which shortness-of-breath and fever. EXAM: CHEST  2 VIEW COMPARISON:  09/23/2016 FINDINGS: Sternotomy wires unchanged. Lungs are adequately inflated demonstrate patchy hazy airspace opacification over the mid to lower lungs likely due to multifocal pneumonia, although hemorrhage could also have this appearance. Mild blunting of the right costophrenic angle unchanged. Mild stable cardiomegaly. Calcified plaque over the aortic arch. Remainder of the exam is unchanged. IMPRESSION: Hazy multifocal airspace process over the mid to lower lungs likely multifocal pneumonia, although hemorrhage could have this appearance. Minimal stable blunting of the right costophrenic angle. Stable cardiomegaly. Electronically Signed   By: Elberta Fortis M.D.   On:  07-16-2017 12:40   Dg Chest Port 1 View  Result Date: 2017/07/16 CLINICAL DATA:  Acute onset of hypoxia.  Evaluate for aspiration. EXAM: PORTABLE CHEST 1 VIEW COMPARISON:  Chest radiograph performed earlier today at 12:44 p.m. FINDINGS: There is significantly worsening bilateral airspace opacification. A small right pleural effusion is noted. This may reflect diffuse pneumonia or pulmonary edema. No pneumothorax is seen. The mediastinal silhouette is enlarged. The patient is status post median sternotomy, with evidence of prior CABG. No acute osseous abnormalities are seen. IMPRESSION: 1. Significantly worsening bilateral airspace opacification; this may reflect diffuse pneumonia or pulmonary edema. Small right pleural effusion noted. 2. Cardiomegaly. Electronically Signed   By: Roanna Raider M.D.   On: 07/16/2017 23:05   Dg Hand Complete Right  Result Date: 2017-07-16 CLINICAL DATA:  77 year old male with a history injury to hand. Lacerations. EXAM: RIGHT HAND - COMPLETE 3+ VIEW COMPARISON:  None. FINDINGS: Acute fracture of the distal aspect of the distal phalanx fifth digit right hand. Soft tissue disruption on the dorsal aspect of the fourth digit near the distal interphalangeal joint and the proximal interphalangeal joint. No radiopaque foreign body. Soft tissue injury involving the fifth digit of the right hand, with no radiopaque foreign body. Degenerative changes of the interphalangeal joints. Extensive arterial calcifications of the right hand. IMPRESSION: Acute fracture of the distal phalanx of the fifth digit right hand with associated soft tissue disruption/injury. Evidence of soft tissue injury on the dorsal aspect of the fourth digit, right hand, overlying the DIP and PIP. Extensive calcifications of the arteries of the right hand. Electronically Signed   By: Gilmer Mor D.O.   On: Jul 16, 2017 17:08    Cardiac Studies   Echo pending  Previous Echo 07/2015  LVEF 65-70%, mild LVH, normal  wall motion, diastolic dysfunction,   indeterminate LV filling pressure, mild LAE, dilated RV with   reduced systolic function, severe RAE, moderate TR, mild PI, RVSP   41 mmHg, dilated IVC.  Patient Profile     This is a chronically-ill 77 y/o M with history of CAD and hx of CABG, AFib on Coumadin and Amiodarone, Diastolic HF, Advanced CKD, chronic anemia and HTN admitted 07-16-2017 with Acute Hypoxic Respiratory Failure secondary to multifocal pneumonia and volume overload. He also presented with acute on chronic anemia with Hb 6.6 and supratherapeutic INR. Course complicated by laceration and fracture of right digit s/p ORIF.   Assessment & Plan    1. Atrial Fibrillation:  NSR on tele. Rate is  controlled. Coumadin has been on hold given supratherapeutic INR on admit + significant anemia. Hgb 6.4 today. Continue to hold.   2. Acute on Chronic Diastolic CHF: BNP markedly elevated at 2,166, in the setting of CKD with SCr at 7.52. Pt is currently been followed by nephrology, and plans are to start IV Lasix today, per nephrology dosing. We appreciate their assistance. Strict I/Os, tract weight. Nephrology will continue to monitor renal function and will adjust diuretic doses accordingly.  Echo pending.   3. CAD: h/o CABG in 2005. No chest pain. Continue medical therapy.   4. HTN: elevated this morning at 179/66. Currently on clonidine. IV Lasix to be initiated today. Volume removal should help with this. Given CAD and Atrial fibrillation, may consider addition of BB, once euvolemic. May also consider amlodipine or Imdur.    5. Multifactorial PNA:  On Ceftriaxone and doxycycline. Flu has been ruled out.   6. CKD: SCr 7.52. K 4.8. Nephrology following. Currently not on HD.   7. Anemia: Hgb 6.4. Was given transfusion earlier.   8. Right hand Lacerations, partial amputation s/p ORIF R digit: Taken promptly to OR after admission for repair.    For questions or updates, please contact [facility] Please consult [email or web address] for contact info under Cardiology/STEMI.      Signed, Robbie Lis, PA-C  07/13/2017, 8:58 AM

## 2017-07-13 NOTE — Progress Notes (Addendum)
RT weaned FIO2 to 8L HFNC due to pt had stable sats

## 2017-07-13 NOTE — Progress Notes (Signed)
  Echocardiogram 2D Echocardiogram has been performed.  Leta Jungling M 07/13/2017, 9:55 AM

## 2017-07-13 NOTE — Progress Notes (Signed)
OT Cancellation Note  Patient Details Name: [name] MRN: [id number] DOB: 1941/03/24   Cancelled Treatment:    Reason Eval/Treat Not Completed: Medical issues which prohibited therapy(Pt with hgb 6.4. Will follow.)  Evern Bio 07/13/2017, 8:38 AM  07/13/2017 Martie Round, OTR/L Pager: [phone]

## 2017-07-13 NOTE — Progress Notes (Signed)
Preliminary results by tech - Renal Duplex Completed. Abnormal high resistance waveforms noted throughout bilateral renal arteries without evidence of   renal artery stenosis bilaterally. Marilynne Halsted, BS, RDMS, RVT

## 2017-07-13 NOTE — Progress Notes (Signed)
S: No new complaints O:BP (!) 162/64 (BP Location: Right Arm)   Pulse 67   Temp 98.5 F (36.9 C) (Oral)   Resp (!) 21   Ht 5\' 8"  (1.727 m)   Wt 79.7 kg (175 lb 11.3 oz)   SpO2 98%   BMI 26.72 kg/m   Intake/Output Summary (Last 24 hours) at 07/13/2017 1224 Last data filed at 07/13/2017 1205 Gross per 24 hour  Intake 1670.65 ml  Output 1350 ml  Net 320.65 ml   Intake/Output: I/O last 3 completed shifts: In: 3583.9 [P.O.:1040; I.V.:470.8; Blood:873.2; IV Piggyback:1200] Out: 2 [Blood:2]  Intake/Output this shift:  Total I/O In: 400.5 [I.V.:3; Blood:397.5] Out: 1350 [Urine:1350] Weight change: 3.496 kg (7 lb 11.3 oz) Gen: NAD CVS: no rub Resp: bilateral crackles Abd: +BS, soft, NT Ext: 1+ edema  Recent Labs  Lab 07/28/17 1640 07/12/17 0338 07/13/17 0417  NA 136 140 137  K 4.4 4.7 4.8  CL 107 111 108  CO2 16* 15* 15*  GLUCOSE 182* 154* 125*  BUN 70* 72* 78*  CREATININE 7.28* 7.32* 7.52*  ALBUMIN  --  2.1* 2.0*  CALCIUM 8.2* 7.8* 8.1*  PHOS  --  5.6* 5.8*  AST  --  28  --   ALT  --  19  --    Liver Function Tests: Recent Labs  Lab 07/12/17 0338 07/13/17 0417  AST 28  --   ALT 19  --   ALKPHOS 83  --   BILITOT 1.1  --   PROT 5.4*  --   ALBUMIN 2.1* 2.0*   No results for input(s): LIPASE, AMYLASE in the last 168 hours. No results for input(s): AMMONIA in the last 168 hours. CBC: Recent Labs  Lab Jul 28, 2017 1640 07/12/17 0338 07/12/17 0937 07/13/17 0417 07/13/17 1129  WBC 16.3* 18.2* 19.7* 20.9*  --   HGB 6.6* 6.4* 7.1* 6.4* 7.5*  HCT 21.2* 19.5* 21.6* 19.3* 22.4*  MCV 95.5 93.8 93.5 93.7  --   PLT 407* 326 365 342  --    Cardiac Enzymes: Recent Labs  Lab 07/28/17 2242 07/12/17 0338 07/12/17 0937  TROPONINI 0.11* 0.13* 0.13*   CBG: Recent Labs  Lab 07/12/17 1229 07/12/17 1631 07/12/17 2118 07/13/17 0738 07/13/17 1204  GLUCAP 162* 145* 159* 110* 150*    Iron Studies:  Recent Labs    28-Jul-2017 2242  IRON 21*  TIBC 234*  FERRITIN  770*   Studies/Results: Dg Chest 2 View  Result Date: 28-Jul-2017 CLINICAL DATA:  Hemoptysis which shortness-of-breath and fever. EXAM: CHEST  2 VIEW COMPARISON:  09/23/2016 FINDINGS: Sternotomy wires unchanged. Lungs are adequately inflated demonstrate patchy hazy airspace opacification over the mid to lower lungs likely due to multifocal pneumonia, although hemorrhage could also have this appearance. Mild blunting of the right costophrenic angle unchanged. Mild stable cardiomegaly. Calcified plaque over the aortic arch. Remainder of the exam is unchanged. IMPRESSION: Hazy multifocal airspace process over the mid to lower lungs likely multifocal pneumonia, although hemorrhage could have this appearance. Minimal stable blunting of the right costophrenic angle. Stable cardiomegaly. Electronically Signed   By: Marin Olp M.D.   On: 2017/07/28 12:40   Dg Chest Port 1 View  Result Date: Jul 28, 2017 CLINICAL DATA:  Acute onset of hypoxia.  Evaluate for aspiration. EXAM: PORTABLE CHEST 1 VIEW COMPARISON:  Chest radiograph performed earlier today at 12:44 p.m. FINDINGS: There is significantly worsening bilateral airspace opacification. A small right pleural effusion is noted. This may reflect diffuse pneumonia or pulmonary edema.  No pneumothorax is seen. The mediastinal silhouette is enlarged. The patient is status post median sternotomy, with evidence of prior CABG. No acute osseous abnormalities are seen. IMPRESSION: 1. Significantly worsening bilateral airspace opacification; this may reflect diffuse pneumonia or pulmonary edema. Small right pleural effusion noted. 2. Cardiomegaly. Electronically Signed   By: Roanna Raider M.D.   On: 07/14/2017 23:05   Dg Hand Complete Right  Result Date: 07/08/2017 CLINICAL DATA:  77 year old male with a history injury to hand. Lacerations. EXAM: RIGHT HAND - COMPLETE 3+ VIEW COMPARISON:  None. FINDINGS: Acute fracture of the distal aspect of the distal phalanx fifth  digit right hand. Soft tissue disruption on the dorsal aspect of the fourth digit near the distal interphalangeal joint and the proximal interphalangeal joint. No radiopaque foreign body. Soft tissue injury involving the fifth digit of the right hand, with no radiopaque foreign body. Degenerative changes of the interphalangeal joints. Extensive arterial calcifications of the right hand. IMPRESSION: Acute fracture of the distal phalanx of the fifth digit right hand with associated soft tissue disruption/injury. Evidence of soft tissue injury on the dorsal aspect of the fourth digit, right hand, overlying the DIP and PIP. Extensive calcifications of the arteries of the right hand. Electronically Signed   By: Gilmer Mor D.O.   On: 07/16/2017 17:08   . amiodarone  200 mg Oral Daily  . cloNIDine  0.1 mg Transdermal Weekly  . cloNIDine  0.1 mg Oral BID  . ezetimibe  5 mg Oral Daily  . feeding supplement (ENSURE ENLIVE)  237 mL Oral BID BM  . furosemide  80 mg Intravenous BID  . guaiFENesin  600 mg Oral BID  . insulin aspart  0-5 Units Subcutaneous QHS  . insulin aspart  0-9 Units Subcutaneous TID WC  . ipratropium-albuterol  3 mL Nebulization TID  . sodium chloride flush  3 mL Intravenous Q12H    BMET    Component Value Date/Time   NA 137 07/13/2017 0417   NA 136 05/25/2017 1028   K 4.8 07/13/2017 0417   CL 108 07/13/2017 0417   CO2 15 (L) 07/13/2017 0417   GLUCOSE 125 (H) 07/13/2017 0417   BUN 78 (H) 07/13/2017 0417   BUN 42 (H) 05/25/2017 1028   CREATININE 7.52 (H) 07/13/2017 0417   CREATININE 2.64 (H) 11/01/2016 0838   CALCIUM 8.1 (L) 07/13/2017 0417   GFRNONAA 6 (L) 07/13/2017 0417   GFRNONAA 23 (L) 11/01/2016 0838   GFRAA 7 (L) 07/13/2017 0417   GFRAA 26 (L) 11/01/2016 0838   CBC    Component Value Date/Time   WBC 20.9 (H) 07/13/2017 0417   RBC 2.06 (L) 07/13/2017 0417   HGB 7.5 (L) 07/13/2017 1129   HGB 13.4 03/17/2010 1538   HCT 22.4 (L) 07/13/2017 1129   HCT 39.3  03/17/2010 1538   PLT 342 07/13/2017 0417   PLT 205 03/17/2010 1538   MCV 93.7 07/13/2017 0417   MCV 92.2 03/17/2010 1538   MCH 31.1 07/13/2017 0417   MCHC 33.2 07/13/2017 0417   RDW 15.0 07/13/2017 0417   RDW 12.6 03/17/2010 1538   LYMPHSABS 1,066 11/01/2016 0838   LYMPHSABS 1.3 03/17/2010 1538   MONOABS 1,066 (H) 11/01/2016 0838   MONOABS 1.0 (H) 03/17/2010 1538   EOSABS 410 11/01/2016 0838   EOSABS 0.9 (H) 03/17/2010 1538   BASOSABS 0 11/01/2016 0838   BASOSABS 0.0 03/17/2010 1538     Assessment/Plan: 1.  Progressive CKD- accelerated rise in Scr over the last  6 months and more recent accelerated HTN.  DDx RAS (has history of ASCVD), atheroembolic disease (although denies any embolic changes to his toes following lower ext angiogram), acute GN/RPGN.  Will order renal artery duplex and some serologies.  His primary Nephrologist, Dr. Justin Mend was kind enough to speak with Mr and Mrs. Legrand Como regarding his worsening renal function and Mr. Schrecengost was clear that he was not interested in dialysis and understood the consequences but is willing to have other studies to be done to see if this is reversible.  He is currently without uremic symptoms. 1. ASO negative, normal complements.  DsDNA, ANCA, anti-GBM pending 2. Remains firm in decision not to pursue RRT even temporarily. 3. Renal artery duplex negative for significant RAS 2. Epistaxis- possibly related to accelerated HTN.  ENT consulted 3. Accelerated HTN- as above.   He has multiple drug intolerances.  Continue with his outpatient regimen and agree with starting catapress patch.  Negative renal artery duplex 07/13/17. 4. ABLA- likely due to epistaxis although will r/o acute GN 5. Acute on chronic diastolic CHF- restart lasix and follow.  ECHO by Cardiology today.on hold for now but may need to resume for volume 6. CAP- on rocephin and azithromycin 7. A fib on coumadin- currently on hold due to epistaxis and ABLA 8. Elevated troponin-  Cardiology has been consulted.  9. 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  10. Disposition- if pt's renal function continues to deteriorate, will need to consult hospice services.   Donetta Potts, MD [employer] [phone]

## 2017-07-13 NOTE — Progress Notes (Signed)
PROGRESS NOTE   [name]  ZOX:[id number]    DOB: December 11, 1940    DOA: 07/30/2017  PCP: Lupita Raider, MD   I have briefly reviewed patients previous medical records in [facility].  Brief Narrative:  77 year old male with PMH of HTN, DM, PAD, CAD status post CABG (Dr. Verdis Prime, Cardiology), stage III CKD (baseline creatinine 1.2-2.5, Dr. Elvis Coil. Nephrology), atrial fibrillation on Coumadin, chronic diastolic CHF, sent to ED from ENT office 2017-07-30 due to dyspnea/hypoxia, anemia and chest x-ray showed multifocal airspace disease concerning for pneumonia versus hemorrhage.  Admitted to stepdown for acute respiratory failure with hypoxia, decompensated CHF, pneumonia versus pulmonary hemorrhage, acute on chronic kidney disease, acute blood loss anemia.  Unfortunately while waiting in the ED, finger got caught in wheelchair causing partial amputation of distal fifth digit and underwent urgent ORIF by hand surgery.  Cardiology and nephrology consulting.   Assessment & Plan:   Active Problems:   Essential hypertension   Hyperlipidemia   CKD (chronic kidney disease), stage IV (HCC)   Type 2 diabetes mellitus with vascular disease (HCC)   Atrial fibrillation (HCC)   Chronic diastolic CHF (congestive heart failure) (HCC)   Anemia   CAP (community acquired pneumonia)   Epistaxis   1. Acute respiratory failure with hypoxia: Possibly multifactorial related to decompensated CHF, community-acquired pneumonia and?  Pulmonary hemorrhage in the context of supratherapeutic INR and considering vasculitis.  Continue IV ceftriaxone, doxycycline, IV Lasix initiated.  Was on HFNC oxygen at 8 L/min and saturating in the mid to high 80s.  Titrate down oxygen as tolerated.  If decompensates, consult CCM and consider transferring to ICU. 2. Community-acquired pneumonia: IV ceftriaxone and doxycycline.  Flu panel PCR and RSV panel negative. 3. Acute on chronic diastolic CHF: Clinically and  radiologically volume overloaded.  Concerned that diuretics will worsen renal function.  The patient clear that he does not wish to go on dialysis.  Discussed with nephrology and have started IV Lasix 80 mg twice daily.  Intake output, daily weights.  Cardiology input appreciated. 4. Acute on stage III chronic kidney disease: As per nephrology, accelerated rise in serum creatinine over the last 6 months and more recent HTN.  Renal artery duplex negative for significant RAS.  Vasculitic workup pending.  Patient has clarified that he does not wish to pursue dialysis. 5. Paroxysmal atrial fibrillation: Currently in sinus rhythm.  Continue amiodarone.  Hold Coumadin due to supratherapeutic INR and bleeding risks.  Cardiology following. 6. Supratherapeutic INR: INR has increased from 3.16 on admission to 5.38.  Will give vitamin K and follow INR in a.m, discussed with Cards who agree. 7. Accelerated hypertension: Difficult to control blood pressure.  Multiple drug allergies.  Continue clonidine.  Negative renal artery duplex 2/13. 8. CAD status post CABG/troponin elevation: Possibly due to demand ischemia from acute illness, acute on chronic kidney disease.  No chest pain or EKG changes.  As per cardiology, echo findings consistent with new regional ischemia but awaiting full echo.  Not candidate for angiography due to recent severe bleeding and acute renal failure. 9. Epistaxis: Evaluated in office by ENT without clear etiology.  Coagulopathy.  Reversed with vitamin K and monitor closely. 10. Acute blood loss anemia: Likely from epistaxis but nephrology concern regarding acute GN.  Status post 2 units PRBC.  Hemoglobin up to 7.5 but monitor closely and transfuse to keep hemoglobin greater than 7. 11. 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 12. Leukocytosis: Follow CBCs.   DVT prophylaxis: SCDs. Code Status: DNR. Family Communication: None at  bedside. Disposition: To be determined.  If renal functions continued to deteriorate, will need palliative input.   Consultants:  Cardiology Nephrology  Procedures:  None  Antimicrobials:  IV ceftriaxone and doxycycline.   Subjective: No further epistaxis or coughing up blood this morning.  Ongoing dyspnea without change since admission.  No chest pain, palpitations, dizziness or lightheadedness reported.  ROS: As above  Objective:  Vitals:   07/13/17 0746 07/13/17 0830 07/13/17 0952 07/13/17 1208  BP: (!) 169/72 (!) 179/66 (!) 169/77 (!) 162/64  Pulse:  72  67  Resp:  (!) 27  (!) 21  Temp: 98 F (36.7 C) 98.5 F (36.9 C)  98.5 F (36.9 C)  TempSrc: Oral Oral  Oral  SpO2:  90%  98%  Weight:      Height:        Examination:  General exam: Elderly male, moderately built and nourished, lying propped up in bed without distress.  Ill looking. Respiratory system: Reduced breath sounds bilaterally, especially in the basis with bibasilar crackles.  No wheezing or rhonchi. Respiratory effort normal. Cardiovascular system: S1 & S2 heard, RRR.  JVD +.  Systolic ejection murmur 2/6 at apex.  2+ pitting bilateral leg edema.  Telemetry personally reviewed: Sinus rhythm with first-degree AV block. Gastrointestinal system: Abdomen is nondistended, soft and nontender. No organomegaly or masses felt. Normal bowel sounds heard. Central nervous system: Alert and oriented. No focal neurological deficits. Extremities: Symmetric 5 x 5 power.  Right lateral 2 fingers.  Dressing clean and dry. Skin: No rashes, lesions or ulcers Psychiatry: Judgement and insight appear normal. Mood & affect appropriate.     Data Reviewed: I have personally reviewed following labs and imaging studies  CBC: Recent Labs  Lab 2017-07-30 1640 07/12/17 0338 07/12/17 0937 07/13/17 0417 07/13/17 1129  WBC 16.3* 18.2* 19.7* 20.9*  --   HGB 6.6* 6.4* 7.1* 6.4* 7.5*  HCT 21.2* 19.5* 21.6* 19.3* 22.4*  MCV  95.5 93.8 93.5 93.7  --   PLT 407* 326 365 342  --    Basic Metabolic Panel: Recent Labs  Lab July 30, 2017 1640 07/12/17 0338 07/13/17 0417  NA 136 140 137  K 4.4 4.7 4.8  CL 107 111 108  CO2 16* 15* 15*  GLUCOSE 182* 154* 125*  BUN 70* 72* 78*  CREATININE 7.28* 7.32* 7.52*  CALCIUM 8.2* 7.8* 8.1*  MG  --  1.9  --   PHOS  --  5.6* 5.8*   Liver Function Tests: Recent Labs  Lab 07/12/17 0338 07/13/17 0417 07/13/17 1129  AST 28  --  35  ALT 19  --  21  ALKPHOS 83  --  95  BILITOT 1.1  --  2.0*  PROT 5.4*  --  5.6*  ALBUMIN 2.1* 2.0* 2.1*   Coagulation Profile: Recent Labs  Lab 2017-07-30 1640 07/12/17 0338 07/13/17 1129  INR 3.16 3.73 5.38*   Cardiac Enzymes: Recent Labs  Lab 2017/07/30 2242 07/12/17 0338 07/12/17 0937  TROPONINI 0.11* 0.13* 0.13*   HbA1C: Recent Labs    07/12/17 0338  HGBA1C 5.5   CBG: Recent Labs  Lab 07/12/17 1229 07/12/17 1631 07/12/17 2118 07/13/17 0738 07/13/17 1204  GLUCAP 162* 145* 159* 110* 150*    Recent Results (from the past 240 hour(s))  Blood Culture (routine x 2)     Status: None (Preliminary result)   Collection Time:  2017/07/21  5:27 PM  Result Value Ref Range Status   Specimen Description BLOOD SITE NOT SPECIFIED  Final   Special Requests   Final    BOTTLES DRAWN AEROBIC AND ANAEROBIC Blood Culture adequate volume   Culture   Final    NO GROWTH 2 DAYS Performed at [facility], [address], [place], [place] [address]    Report Status PENDING  Incomplete  Respiratory Panel by PCR     Status: None   Collection Time: Jul 21, 2017  9:16 PM  Result Value Ref Range Status   Adenovirus NOT DETECTED NOT DETECTED Final   Coronavirus 229E NOT DETECTED NOT DETECTED Final   Coronavirus HKU1 NOT DETECTED NOT DETECTED Final   Coronavirus NL63 NOT DETECTED NOT DETECTED Final   Coronavirus OC43 NOT DETECTED NOT DETECTED Final   Metapneumovirus NOT DETECTED NOT DETECTED Final   Rhinovirus / Enterovirus NOT DETECTED NOT  DETECTED Final   Influenza A NOT DETECTED NOT DETECTED Final   Influenza A H1 NOT DETECTED NOT DETECTED Final   Influenza A H1 2009 NOT DETECTED NOT DETECTED Final   Influenza A H3 NOT DETECTED NOT DETECTED Final   Influenza B NOT DETECTED NOT DETECTED Final   Parainfluenza Virus 1 NOT DETECTED NOT DETECTED Final   Parainfluenza Virus 2 NOT DETECTED NOT DETECTED Final   Parainfluenza Virus 3 NOT DETECTED NOT DETECTED Final   Parainfluenza Virus 4 NOT DETECTED NOT DETECTED Final   Respiratory Syncytial Virus NOT DETECTED NOT DETECTED Final   Bordetella pertussis NOT DETECTED NOT DETECTED Final   Chlamydophila pneumoniae NOT DETECTED NOT DETECTED Final   Mycoplasma pneumoniae NOT DETECTED NOT DETECTED Final    Comment: Performed at [facility], [address], [place], [place] [address]  Urine Culture     Status: None   Collection Time: 07/21/2017 10:42 PM  Result Value Ref Range Status   Specimen Description URINE, CLEAN CATCH  Final   Special Requests Normal  Final   Culture   Final    NO GROWTH Performed at [facility], [address], [place], [place] [address]    Report Status 07/13/2017 FINAL  Final  MRSA PCR Screening     Status: None   Collection Time: 07/12/17  1:00 AM  Result Value Ref Range Status   MRSA by PCR NEGATIVE NEGATIVE Final    Comment:        The GeneXpert MRSA Assay (FDA approved for NASAL specimens only), is one component of a comprehensive MRSA colonization surveillance program. It is not intended to diagnose MRSA infection nor to guide or monitor treatment for MRSA infections. Performed at [facility], [address] [place], [place] [address]   Culture, blood (Routine X 2) w Reflex to ID Panel     Status: None (Preliminary result)   Collection Time: 07/12/17  4:13 AM  Result Value Ref Range Status   Specimen Description BLOOD RIGHT ARM  Final   Special Requests IN PEDIATRIC BOTTLE Blood Culture adequate volume  Final   Culture    Final    NO GROWTH 1 DAY Performed at [facility], [address], [place], [place] [address]    Report Status PENDING  Incomplete         Radiology Studies: Dg Chest Port 1 View  Result Date: 2017-07-21 CLINICAL DATA:  Acute onset of hypoxia.  Evaluate for aspiration. EXAM: PORTABLE CHEST 1 VIEW COMPARISON:  Chest radiograph performed earlier  today at 12:44 p.m. FINDINGS: There is significantly worsening bilateral airspace opacification. A small right pleural effusion is noted. This may reflect diffuse pneumonia or pulmonary edema. No pneumothorax is seen. The mediastinal silhouette is enlarged. The patient is status post median sternotomy, with evidence of prior CABG. No acute osseous abnormalities are seen. IMPRESSION: 1. Significantly worsening bilateral airspace opacification; this may reflect diffuse pneumonia or pulmonary edema. Small right pleural effusion noted. 2. Cardiomegaly. Electronically Signed   By: Roanna Raider M.D.   On: 07/19/17 23:05   Dg Hand Complete Right  Result Date: 07/19/2017 CLINICAL DATA:  77 year old male with a history injury to hand. Lacerations. EXAM: RIGHT HAND - COMPLETE 3+ VIEW COMPARISON:  None. FINDINGS: Acute fracture of the distal aspect of the distal phalanx fifth digit right hand. Soft tissue disruption on the dorsal aspect of the fourth digit near the distal interphalangeal joint and the proximal interphalangeal joint. No radiopaque foreign body. Soft tissue injury involving the fifth digit of the right hand, with no radiopaque foreign body. Degenerative changes of the interphalangeal joints. Extensive arterial calcifications of the right hand. IMPRESSION: Acute fracture of the distal phalanx of the fifth digit right hand with associated soft tissue disruption/injury. Evidence of soft tissue injury on the dorsal aspect of the fourth digit, right hand, overlying the DIP and PIP. Extensive calcifications of the arteries of the right hand.  Electronically Signed   By: Gilmer Mor D.O.   On: 2017/07/19 17:08        Scheduled Meds: . amiodarone  200 mg Oral Daily  . cloNIDine  0.1 mg Transdermal Weekly  . cloNIDine  0.1 mg Oral BID  . ezetimibe  5 mg Oral Daily  . feeding supplement (ENSURE ENLIVE)  237 mL Oral BID BM  . furosemide  80 mg Intravenous BID  . guaiFENesin  600 mg Oral BID  . insulin aspart  0-5 Units Subcutaneous QHS  . insulin aspart  0-9 Units Subcutaneous TID WC  . ipratropium-albuterol  3 mL Nebulization TID  . sodium chloride flush  3 mL Intravenous Q12H   Continuous Infusions: . sodium chloride 10 mL/hr (07/12/17 1730)  . sodium chloride    . cefTRIAXone (ROCEPHIN)  IV Stopped (07/12/17 1748)  . doxycycline (VIBRAMYCIN) IV Stopped (07/13/17 1043)     LOS: 2 days     Marcellus Scott, MD, FACP, [facility]. [facility] Pager [phone]  If 7PM-7AM, please contact night-coverage [email or web address] Password [facility] 07/13/2017, 2:46 PM

## 2017-07-13 NOTE — Progress Notes (Signed)
Lab notified this nurse that patient has a critical hemoglobin level of 6.4.  [facility] notified via Amion.  Awaiting orders at this time.

## 2017-07-14 ENCOUNTER — Inpatient Hospital Stay (HOSPITAL_COMMUNITY): Payer: Medicare Other

## 2017-07-14 DIAGNOSIS — N179 Acute kidney failure, unspecified: Secondary | ICD-10-CM

## 2017-07-14 DIAGNOSIS — D649 Anemia, unspecified: Secondary | ICD-10-CM

## 2017-07-14 DIAGNOSIS — R0902 Hypoxemia: Secondary | ICD-10-CM

## 2017-07-14 LAB — TYPE AND SCREEN
ABO/RH(D): B POS
Antibody Screen: NEGATIVE
UNIT DIVISION: 0
Unit division: 0

## 2017-07-14 LAB — GLUCOSE, CAPILLARY
GLUCOSE-CAPILLARY: 135 mg/dL — AB (ref 65–99)
Glucose-Capillary: 121 mg/dL — ABNORMAL HIGH (ref 65–99)

## 2017-07-14 LAB — CBC
HCT: 22.5 % — ABNORMAL LOW (ref 39.0–52.0)
Hemoglobin: 7.5 g/dL — ABNORMAL LOW (ref 13.0–17.0)
MCH: 30.7 pg (ref 26.0–34.0)
MCHC: 33.3 g/dL (ref 30.0–36.0)
MCV: 92.2 fL (ref 78.0–100.0)
PLATELETS: 348 10*3/uL (ref 150–400)
RBC: 2.44 MIL/uL — ABNORMAL LOW (ref 4.22–5.81)
RDW: 15.3 % (ref 11.5–15.5)
WBC: 19.1 10*3/uL — ABNORMAL HIGH (ref 4.0–10.5)

## 2017-07-14 LAB — RENAL FUNCTION PANEL
Albumin: 2 g/dL — ABNORMAL LOW (ref 3.5–5.0)
Anion gap: 15 (ref 5–15)
BUN: 84 mg/dL — AB (ref 6–20)
CALCIUM: 8.3 mg/dL — AB (ref 8.9–10.3)
CHLORIDE: 105 mmol/L (ref 101–111)
CO2: 14 mmol/L — AB (ref 22–32)
CREATININE: 7.79 mg/dL — AB (ref 0.61–1.24)
GFR calc Af Amer: 7 mL/min — ABNORMAL LOW (ref >60)
GFR calc non Af Amer: 6 mL/min — ABNORMAL LOW (ref >60)
GLUCOSE: 131 mg/dL — AB (ref 65–99)
Phosphorus: 6.9 mg/dL — ABNORMAL HIGH (ref 2.5–4.6)
Potassium: 4.9 mmol/L (ref 3.5–5.1)
SODIUM: 134 mmol/L — AB (ref 135–145)

## 2017-07-14 LAB — BPAM RBC
BLOOD PRODUCT EXPIRATION DATE: 201903032359
Blood Product Expiration Date: 201902192359
ISSUE DATE / TIME: 201902120144
ISSUE DATE / TIME: 201902130601
UNIT TYPE AND RH: 7300
Unit Type and Rh: 7300

## 2017-07-14 LAB — GLOMERULAR BASEMENT MEMBRANE ANTIBODIES: GBM AB: 3 U (ref 0–20)

## 2017-07-14 LAB — PROTIME-INR
INR: 3.38
PROTHROMBIN TIME: 33.9 s — AB (ref 11.4–15.2)

## 2017-07-14 MED ORDER — BISACODYL 5 MG PO TBEC
5.0000 mg | DELAYED_RELEASE_TABLET | Freq: Every day | ORAL | Status: DC | PRN
  Administered 2017-07-14: 5 mg via ORAL
  Filled 2017-07-14: qty 1

## 2017-07-14 NOTE — Progress Notes (Addendum)
PROGRESS NOTE   [name]  ZOX:[id number]    DOB: [birth year]    DOA: 08/09/17  PCP: Lupita Raider, MD   I have briefly reviewed patients previous medical records in [facility].  Brief Narrative:  77 year old male with PMH of HTN, DM, PAD, CAD status post CABG (Dr. Verdis Prime, Cardiology), stage III CKD (baseline creatinine 1.2-2.5, Dr. Elvis Coil. Nephrology), atrial fibrillation on Coumadin, chronic diastolic CHF, sent to ED from ENT office 08/09/2017 due to dyspnea/hypoxia, anemia and chest x-ray showed multifocal airspace disease concerning for pneumonia versus hemorrhage.  Admitted to stepdown for acute respiratory failure with hypoxia, decompensated CHF, pneumonia versus pulmonary hemorrhage, acute on chronic kidney disease, acute blood loss anemia.  Unfortunately while waiting in the ED, finger got caught in wheelchair causing partial amputation of distal fifth digit and underwent urgent ORIF by hand surgery.  Cardiology and nephrology consulting.   Assessment & Plan:   Active Problems:   Essential hypertension   Hyperlipidemia   CKD (chronic kidney disease), stage IV (HCC)   Type 2 diabetes mellitus with vascular disease (HCC)   Atrial fibrillation (HCC)   Chronic diastolic CHF (congestive heart failure) (HCC)   Anemia   CAP (community acquired pneumonia)   Epistaxis   1. Acute respiratory failure with hypoxia: Possibly multifactorial related to decompensated CHF, community-acquired pneumonia and?  Pulmonary hemorrhage in the context of supratherapeutic INR and considering vasculitis.  Continue IV ceftriaxone, doxycycline, IV Lasix initiated.  Remains on HFNC oxygen at 8 L/min and saturating in the low 90s but desaturates on attempting to wean down.  Titrate down oxygen as tolerated.  If decompensates, consult CCM and consider transferring to ICU. 2. Community-acquired pneumonia: IV ceftriaxone and doxycycline.  Flu panel PCR and RSV panel negative.  Blood cultures x2:  Negative to date. 3. Acute on chronic combined systolic & diastolic CHF: Clinically and radiologically volume overloaded.  Concerned that diuretics will worsen renal function.  The patient has clarified to more than one physician that he does not wish to go on dialysis despite being aware of risks of deterioration and even death.  IV Lasix 80 mg twice daily.  Intake output, daily weights.  Cardiology input appreciated.  Put out 3.5 L urine on 2/13.  Still +950 mL.  Creatinine has crept up slightly.  Continue current management.  Chest x-ray 2/14 reviewed.  Some improvement on the right.  Likely combination of pneumonia and CHF. 4. Acute on stage III chronic kidney disease: As per nephrology, accelerated rise in serum creatinine over the last 6 months and more recent HTN.  Renal artery duplex negative for significant RAS.  Vasculitic workup pending.  Patient has clarified that he does not wish to pursue dialysis.  Creatinine slowly rising in the context of IV diuresis.  Nephrology following. 5. Paroxysmal atrial fibrillation: Currently in sinus rhythm.  Continue amiodarone.  Holding Coumadin due to supratherapeutic INR and bleeding risks.  Cardiology following. 6. Supratherapeutic INR: INR had increased from 3.16 on admission to 5.38 on 2/13.  Status post vitamin K 5 mg PO on 2/13 and INR down to 3.38.   7. Accelerated hypertension: Difficult to control blood pressure.  Multiple drug allergies.  Continue clonidine.  Negative renal artery duplex 2/13.  Blood pressure is better and even in the soft range at times.?  Reduce clonidine due to risk of worsening renal insufficiency. 8. CAD status post CABG/troponin elevation: Possibly due to demand ischemia from acute illness, acute on chronic kidney disease.  No chest pain or EKG changes.  Not candidate for angiography due to recent severe bleeding and acute renal failure.  2D echo 2/13: LVEF 45-50%, worse than 2017, grade 2 diastolic dysfunction. 9. Epistaxis:  Evaluated in office by ENT without clear etiology.  Coagulopathy.  Reversed with vitamin K and monitor closely.  No further epistaxis. 10. Acute blood loss anemia: Likely from epistaxis but nephrology concerned regarding acute GN.  Status post 2 units PRBC.  Hemoglobin up to 7.5 but monitor closely and transfuse to keep hemoglobin greater than 7.  Hemoglobin stable in the mid 7 g range. 11. Traumatic right ring finger laceration without tendon involvement & right small finger open distal phalanx fracture: Status post open debridement of right small finger and ORIF 07/25/2017 by Dr. Orlan Leavens who recommended follow-up in office in 7-10 days for wound check, suture removal, therapy evaluation. 12. Leukocytosis: Follow CBCs.  No significant change.   DVT prophylaxis: SCDs. Code Status: DNR. Family Communication: None at bedside. Disposition: To be determined.  If renal functions continued to deteriorate, will need palliative input.   Consultants:  Cardiology Nephrology Hand surgery  Procedures:  None  Antimicrobials:  IV ceftriaxone and doxycycline.   Subjective: Patient reported some chest across lower anterior chest last night, worse with deep inspiration or coughing.  Coughing with intermittent mild blood-tinged sputum.  No nose bleeding.  Dyspnea somewhat improved.  Urinating well.  ROS: As above  Objective:  Vitals:   07/14/17 0200 07/14/17 0419 07/14/17 0731 07/14/17 0745  BP: (!) 154/58 (!) 107/91 107/89 133/63  Pulse: 86 (!) 101 (!) 102 66  Resp: (!) 21 (!) 26 (!) 24 17  Temp:  (!) 97 F (36.1 C)  99.3 F (37.4 C)  TempSrc:  Axillary  Oral  SpO2: (!) 87%  (!) 88% 90%  Weight:      Height:        Examination:  General exam: Elderly male, moderately built and nourished, lying propped up in bed without distress.  Ill looking.  Respiratory system: Slightly improved breath sounds bilaterally.  Still diminished in the bases with basilar crackles.  No wheezing or rhonchi.  No  increased work of breathing. Cardiovascular system: S1 & S2 heard, RRR.  JVD +.  Systolic ejection murmur 2/6 at apex.  2+ pitting bilateral leg edema, decreased compared to yesterday.  Telemetry personally reviewed: Sinus rhythm. Gastrointestinal system: Abdomen is nondistended, soft and nontender. No organomegaly or masses felt. Normal bowel sounds heard.  Stable without change. Central nervous system: Alert and oriented. No focal neurological deficits.  Stable without change. Extremities: Symmetric 5 x 5 power.  Right lateral 2 fingers.  Dressing clean and dry.  Stable without change. Skin: No rashes, lesions or ulcers Psychiatry: Judgement and insight appear normal. Mood & affect appropriate.     Data Reviewed: I have personally reviewed following labs and imaging studies  CBC: Recent Labs  Lab 07/12/17 0338 07/12/17 0937 07/13/17 0417 07/13/17 1129 07/13/17 1535 07/14/17 0449  WBC 18.2* 19.7* 20.9*  --  18.1* 19.1*  HGB 6.4* 7.1* 6.4* 7.5* 7.6* 7.5*  HCT 19.5* 21.6* 19.3* 22.4* 23.0* 22.5*  MCV 93.8 93.5 93.7  --  92.0 92.2  PLT 326 365 342  --  324 348   Basic Metabolic Panel: Recent Labs  Lab 07/07/2017 1640 07/12/17 0338 07/13/17 0417 07/14/17 0449  NA 136 140 137 134*  K 4.4 4.7 4.8 4.9  CL 107 111 108 105  CO2 16* 15* 15* 14*  GLUCOSE 182* 154* 125* 131*  BUN 70* 72* 78* 84*  CREATININE 7.28* 7.32* 7.52* 7.79*  CALCIUM 8.2* 7.8* 8.1* 8.3*  MG  --  1.9  --   --   PHOS  --  5.6* 5.8* 6.9*   Liver Function Tests: Recent Labs  Lab 07/12/17 0338 07/13/17 0417 07/13/17 1129 07/14/17 0449  AST 28  --  35  --   ALT 19  --  21  --   ALKPHOS 83  --  95  --   BILITOT 1.1  --  2.0*  --   PROT 5.4*  --  5.6*  --   ALBUMIN 2.1* 2.0* 2.1* 2.0*   Coagulation Profile: Recent Labs  Lab 07/24/2017 1640 07/12/17 0338 07/13/17 1129 07/14/17 0449  INR 3.16 3.73 5.38* 3.38   Cardiac Enzymes: Recent Labs  Lab 07/08/2017 2242 07/12/17 0338 07/12/17 0937  TROPONINI  0.11* 0.13* 0.13*   HbA1C: Recent Labs    07/12/17 0338  HGBA1C 5.5   CBG: Recent Labs  Lab 07/12/17 2118 07/13/17 0738 07/13/17 1204 07/13/17 1619 07/13/17 2109  GLUCAP 159* 110* 150* 115* 116*    Recent Results (from the past 240 hour(s))  Blood Culture (routine x 2)     Status: None (Preliminary result)   Collection Time: 07/22/2017  5:27 PM  Result Value Ref Range Status   Specimen Description BLOOD SITE NOT SPECIFIED  Final   Special Requests   Final    BOTTLES DRAWN AEROBIC AND ANAEROBIC Blood Culture adequate volume   Culture   Final    NO GROWTH 2 DAYS Performed at [facility], [address] [place], [place] [address]    Report Status PENDING  Incomplete  Respiratory Panel by PCR     Status: None   Collection Time: 07/12/2017  9:16 PM  Result Value Ref Range Status   Adenovirus NOT DETECTED NOT DETECTED Final   Coronavirus 229E NOT DETECTED NOT DETECTED Final   Coronavirus HKU1 NOT DETECTED NOT DETECTED Final   Coronavirus NL63 NOT DETECTED NOT DETECTED Final   Coronavirus OC43 NOT DETECTED NOT DETECTED Final   Metapneumovirus NOT DETECTED NOT DETECTED Final   Rhinovirus / Enterovirus NOT DETECTED NOT DETECTED Final   Influenza A NOT DETECTED NOT DETECTED Final   Influenza A H1 NOT DETECTED NOT DETECTED Final   Influenza A H1 2009 NOT DETECTED NOT DETECTED Final   Influenza A H3 NOT DETECTED NOT DETECTED Final   Influenza B NOT DETECTED NOT DETECTED Final   Parainfluenza Virus 1 NOT DETECTED NOT DETECTED Final   Parainfluenza Virus 2 NOT DETECTED NOT DETECTED Final   Parainfluenza Virus 3 NOT DETECTED NOT DETECTED Final   Parainfluenza Virus 4 NOT DETECTED NOT DETECTED Final   Respiratory Syncytial Virus NOT DETECTED NOT DETECTED Final   Bordetella pertussis NOT DETECTED NOT DETECTED Final   Chlamydophila pneumoniae NOT DETECTED NOT DETECTED Final   Mycoplasma pneumoniae NOT DETECTED NOT DETECTED Final    Comment: Performed at [facility]  Lab, [address] [place], [place] [address]  Urine Culture     Status: None   Collection Time: 07/16/2017 10:42 PM  Result Value Ref Range Status   Specimen Description URINE, CLEAN CATCH  Final   Special Requests Normal  Final   Culture   Final    NO GROWTH Performed at [facility] Lab, [address] [place], [place] [address]    Report Status 07/13/2017 FINAL  Final  MRSA  PCR Screening     Status: None   Collection Time: 07/12/17  1:00 AM  Result Value Ref Range Status   MRSA by PCR NEGATIVE NEGATIVE Final    Comment:        The GeneXpert MRSA Assay (FDA approved for NASAL specimens only), is one component of a comprehensive MRSA colonization surveillance program. It is not intended to diagnose MRSA infection nor to guide or monitor treatment for MRSA infections. Performed at [facility] Lab, [address], [place], [place] [address]   Culture, blood (Routine X 2) w Reflex to ID Panel     Status: None (Preliminary result)   Collection Time: 07/12/17  4:13 AM  Result Value Ref Range Status   Specimen Description BLOOD RIGHT ARM  Final   Special Requests IN PEDIATRIC BOTTLE Blood Culture adequate volume  Final   Culture   Final    NO GROWTH 1 DAY Performed at [facility] Lab, [address], [place], [place] [address]    Report Status PENDING  Incomplete         Radiology Studies: Dg Chest Port 1 View  Result Date: 07/14/2017 CLINICAL DATA:  Hypoxia EXAM: PORTABLE CHEST 1 VIEW COMPARISON:  2017-07-22 and September 23, 2016 FINDINGS: There is extensive interstitial edema throughout the lungs diffusely. There is airspace opacity throughout portions of the left mid and lower lung zones as well as in the right base. Note that there has been partial clearing of airspace opacity from the right mid lower lung zones. There are minimal pleural effusions bilaterally. There is cardiomegaly with pulmonary venous hypertension. Patient is status post coronary artery bypass  grafting. There is aortic atherosclerosis. No evident adenopathy. There is extensive degenerative change in both shoulders, more severe on the left than on the right, stable. IMPRESSION: Pulmonary vascular congestion. Minimal pleural effusions bilaterally. Areas of airspace opacity bilaterally, stable on the left with partial clearing on the right. There is extensive underlying interstitial edema, stable. The present changes may be due entirely to congestive heart failure with partial clearing of alveolar edema on the right. There may also be a degree of superimposed pneumonia, currently more severe on the left than on the right. Both congestive heart failure and pneumonia may be present concurrently. Patient is status post coronary artery bypass grafting. There is aortic atherosclerosis. Aortic Atherosclerosis (ICD10-I70.0). Electronically Signed   By: Bretta Bang III M.D.   On: 07/14/2017 09:33   Vas US Renal Artery Duplex  Result Date: 07/13/2017 ABDOMINAL VISCERAL Other Indication: Hypertension with known advanced chronic kidney disease. High Risk Factors: Coronary artery disease.  Limitations: Air/bowel gas.  Examination Guidelines: A complete evaluation includes B-mode imaging, spectral doppler, color doppler, and power doppler as needed of all accessible portions of each vessel. Bilateral testing is considered an integral part of a complete examination. Limited examinations for reoccurring indications may be performed as noted.  Duplex Findings: +--------------------+--------+--------+------+ Mesenteric          PSV cm/sEDV cm/sPlaque +--------------------+--------+--------+------+ Aorta Prox             96                  +--------------------+--------+--------+------+ Celiac Artery Origin  186                  +--------------------+--------+--------+------+ SMA Proximal          301                  +--------------------+--------+--------+------+    +------------------+--------+--------+-------+  Right Renal ArteryPSV cm/sEDV cm/sComment +------------------+--------+--------+-------+ Origin             85.00   23.00          +------------------+--------+--------+-------+ Proximal           56.30   10.10          +------------------+--------+--------+-------+ Mid                64.00   10.10          +------------------+--------+--------+-------+ Distal             53.00   11.80          +------------------+--------+--------+-------+ Hilum              38.00   11.00          +------------------+--------+--------+-------+ +-----------------+--------+--------+-------+ Left Renal ArteryPSV cm/sEDV cm/sComment +-----------------+--------+--------+-------+ Origin            72.00   11.00          +-----------------+--------+--------+-------+ Proximal          67.00   14.40          +-----------------+--------+--------+-------+ MId               71.80   12.60          +-----------------+--------+--------+-------+ Distal            58.00    9.57          +-----------------+--------+--------+-------+ Hilum             31.00    7.00          +-----------------+--------+--------+-------+  RT Kidney Length 11.00  LT Kidney Length 12.10 +------------+--------+--------+----+-----------+-----+----+--+ Right KidneyPSV cm/sEDV cm/s RI Left Kidney PSV EDV RI +------------+--------+--------+----+-----------+-----+----+--+ Upper Pole   27.50    8.60  0.69Upper Pole 28.30       +------------+--------+--------+----+-----------+-----+----+--+ Mid           26.0     0            Mid    [phone]   +------------+--------+--------+----+-----------+-----+----+--+ Lower Pole    21.0     0        Lower Pole [phone]   +------------+--------+--------+----+-----------+-----+----+--+ +------------+----++-----------+----+ Right Kidney    Left Kidney     +------------+----++-----------+----+  Cortex      16/7Cortex     12/4 +------------+----++-----------+----+ FINAL INTERPRETATION: Renal:  Right: No evidence of right renal artery stenosis. Abnormal right        Resistive Index. Diffuse increased echogenicity of the renal        parenchyma. Left:  No evidence of left renal artery stenosis. Abnormal left        Resisitve Index. Diffuse increased echogenicity of the renal        parenchyma. 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  *See table(s) above for measurements and observations.  Diagnosing physician: Fabienne Bruns  Electronically signed by Fabienne Bruns on 07/13/2017 at 8:00:25 PM. Final        Scheduled Meds: . amiodarone  200 mg Oral Daily  . cloNIDine  0.1 mg Transdermal Weekly  . cloNIDine  0.1 mg Oral BID  . ezetimibe  5 mg Oral Daily  . feeding supplement (ENSURE ENLIVE)  237 mL Oral BID BM  . furosemide  80 mg Intravenous BID  . guaiFENesin  600 mg Oral BID  . insulin aspart  0-5 Units Subcutaneous QHS  . insulin aspart  0-9 Units Subcutaneous TID WC  . ipratropium-albuterol  3 mL Nebulization TID  . sodium chloride flush  3 mL Intravenous Q12H   Continuous Infusions: . sodium chloride 10 mL/hr (07/12/17 1730)  . cefTRIAXone (ROCEPHIN)  IV Stopped (07/13/17 1805)  . doxycycline (VIBRAMYCIN) IV 100 mg (07/14/17 0848)     LOS: 3 days     Marcellus Scott, MD, FACP, [facility]. [facility] Pager [phone]  If 7PM-7AM, please contact night-coverage [email or web address] Password [facility] 07/14/2017, 10:24 AM

## 2017-07-14 NOTE — Progress Notes (Signed)
Patient resting peacefully at this time.  No complaints of pain.  Safety and comfort measures maintained, call bell remains within reach. Will continue to monitor.

## 2017-07-14 NOTE — Progress Notes (Signed)
PT Cancellation Note  Patient Details Name: [name] MRN: [id number] DOB: 23-Apr-1941   Cancelled Treatment:    Reason Eval/Treat Not Completed: Other (comment)(Pt vomiting and feeling poorly. Nurse agreed to HOLD. )   Amadeo Garnet Devon Pretty 07/14/2017, 10:50 AM  [facility] [phone] (pager)

## 2017-07-14 NOTE — Care Management Note (Signed)
Case Management Note  Patient Details  Name: [name] MRN: [id number] Date of Birth: 07-02-1940  Subjective/Objective:   From home, presents with  acute respiratory failure with hypoxia, decompensated CHF, pneumonia versus pulmonary hemorrhage, acute on chronic kidney disease, acute blood loss anemia.  Unfortunately while waiting in the ED, finger got caught in wheelchair causing partial amputation of distal fifth digit and underwent urgent ORIF by hand surgery.  Cardiology and nephrology consulting.                    Action/Plan: NCM will follow for transition of care.  Expected Discharge Date:  07/18/17               Expected Discharge Plan:     In-House Referral:     Discharge planning Services  CM Consult  Post Acute Care Choice:    Choice offered to:     DME Arranged:    DME Agency:     HH Arranged:    HH Agency:     Status of Service:  In process, will continue to follow  If discussed at Long Length of Stay Meetings, dates discussed:    Additional Comments:  Leone Haven, RN 07/14/2017, 3:38 PM

## 2017-07-14 NOTE — Progress Notes (Signed)
Progress Note  Patient Name: [name] Date of Encounter: 07/14/2017  Primary Cardiologist: Lesleigh Noe, MD   Subjective   Intermittently has problems gagging on thick sputum  - he just vomited. Reports dyspnea is mild. Still on hi-flow O2 8l [place] No anginal chest discomfort. Maintains excellent UO despite creatinine 7.5-7.8. Some partial chest xray improvement. Remains anemic, Hgb 7.5 after transfusion. INR from 5.4 to 3.4 after vit K  Inpatient Medications    Scheduled Meds: . amiodarone  200 mg Oral Daily  . cloNIDine  0.1 mg Transdermal Weekly  . cloNIDine  0.1 mg Oral BID  . ezetimibe  5 mg Oral Daily  . feeding supplement (ENSURE ENLIVE)  237 mL Oral BID BM  . furosemide  80 mg Intravenous BID  . guaiFENesin  600 mg Oral BID  . insulin aspart  0-5 Units Subcutaneous QHS  . insulin aspart  0-9 Units Subcutaneous TID WC  . ipratropium-albuterol  3 mL Nebulization TID  . sodium chloride flush  3 mL Intravenous Q12H   Continuous Infusions: . sodium chloride 10 mL/hr (07/12/17 1730)  . cefTRIAXone (ROCEPHIN)  IV 1 g (07/14/17 1711)  . doxycycline (VIBRAMYCIN) IV Stopped (07/14/17 1048)   PRN Meds: sodium chloride, acetaminophen **OR** acetaminophen, HYDROcodone-acetaminophen, levalbuterol, ondansetron **OR** ondansetron (ZOFRAN) IV, sodium chloride, sodium chloride flush   Vital Signs    Vitals:   07/14/17 0745 07/14/17 1128 07/14/17 1326 07/14/17 1640  BP: 133/63 (!) 154/64 (!) 144/72 (!) 173/71  Pulse: 66 68 70 80  Resp: 17 (!) 22 (!) 22 (!) 23  Temp: 99.3 F (37.4 C) 98.1 F (36.7 C)  98.1 F (36.7 C)  TempSrc: Oral Oral  Oral  SpO2: 90% 92% 97% 90%  Weight:      Height:        Intake/Output Summary (Last 24 hours) at 07/14/2017 1727 Last data filed at 07/14/2017 1645 Gross per 24 hour  Intake 443 ml  Output 2825 ml  Net -2382 ml   Filed Weights   07/12/17 0104 07/13/17 0107 07/14/17 0117  Weight: 166 lb 14.2 oz (75.7 kg) 175 lb 11.3  oz (79.7 kg) 173 lb 4.5 oz (78.6 kg)    Telemetry    NSR, no atrial fibrillation is seen - Personally Reviewed  ECG    No new tracing - Personally Reviewed  Physical Exam  Pale, tachypneic GEN: No acute distress.   Neck: No JVD Cardiac: RRR, no murmurs, rubs, or gallops.  Respiratory: Clear to auscultation bilaterally. GI: Soft, nontender, non-distended  MS: No edema; No deformity. Neuro:  Nonfocal  Psych: Normal affect   Labs    Chemistry Recent Labs  Lab 07/12/17 0338 07/13/17 0417 07/13/17 1129 07/14/17 0449  NA 140 137  --  134*  K 4.7 4.8  --  4.9  CL 111 108  --  105  CO2 15* 15*  --  14*  GLUCOSE 154* 125*  --  131*  BUN 72* 78*  --  84*  CREATININE 7.32* 7.52*  --  7.79*  CALCIUM 7.8* 8.1*  --  8.3*  PROT 5.4*  --  5.6*  --   ALBUMIN 2.1* 2.0* 2.1* 2.0*  AST 28  --  35  --   ALT 19  --  21  --   ALKPHOS 83  --  95  --   BILITOT 1.1  --  2.0*  --   GFRNONAA 6* 6*  --  6*  GFRAA 7*  7*  --  7*  ANIONGAP 14 14  --  15     Hematology Recent Labs  Lab 07/13/17 0417 07/13/17 1129 07/13/17 1535 07/14/17 0449  WBC 20.9*  --  18.1* 19.1*  RBC 2.06*  --  2.50* 2.44*  HGB 6.4* 7.5* 7.6* 7.5*  HCT 19.3* 22.4* 23.0* 22.5*  MCV 93.7  --  92.0 92.2  MCH 31.1  --  30.4 30.7  MCHC 33.2  --  33.0 33.3  RDW 15.0  --  15.1 15.3  PLT 342  --  324 348    Cardiac Enzymes Recent Labs  Lab Jul 26, 2017 2242 07/12/17 0338 07/12/17 0937  TROPONINI 0.11* 0.13* 0.13*    Recent Labs  Lab 07-26-2017 1712  TROPIPOC 0.12*     BNP Recent Labs  Lab 07/26/2017 1726  BNP 2,166.2*     DDimer No results for input(s): DDIMER in the last 168 hours.   Radiology    Dg Chest Port 1 View  Result Date: 07/14/2017 CLINICAL DATA:  Hypoxia EXAM: PORTABLE CHEST 1 VIEW COMPARISON:  07/26/17 and September 23, 2016 FINDINGS: There is extensive interstitial edema throughout the lungs diffusely. There is airspace opacity throughout portions of the left mid and lower lung  zones as well as in the right base. Note that there has been partial clearing of airspace opacity from the right mid lower lung zones. There are minimal pleural effusions bilaterally. There is cardiomegaly with pulmonary venous hypertension. Patient is status post coronary artery bypass grafting. There is aortic atherosclerosis. No evident adenopathy. There is extensive degenerative change in both shoulders, more severe on the left than on the right, stable. IMPRESSION: Pulmonary vascular congestion. Minimal pleural effusions bilaterally. Areas of airspace opacity bilaterally, stable on the left with partial clearing on the right. There is extensive underlying interstitial edema, stable. The present changes may be due entirely to congestive heart failure with partial clearing of alveolar edema on the right. There may also be a degree of superimposed pneumonia, currently more severe on the left than on the right. Both congestive heart failure and pneumonia may be present concurrently. Patient is status post coronary artery bypass grafting. There is aortic atherosclerosis. Aortic Atherosclerosis (ICD10-I70.0). Electronically Signed   By: Lowella Grip III M.D.   On: 07/14/2017 09:33   Vas US Renal Artery Duplex  Result Date: 07/13/2017 ABDOMINAL VISCERAL Other Indication: Hypertension with known advanced chronic kidney disease. High Risk Factors: Coronary artery disease.  Limitations: Air/bowel gas.  Examination Guidelines: A complete evaluation includes B-mode imaging, spectral doppler, color doppler, and power doppler as needed of all accessible portions of each vessel. Bilateral testing is considered an integral part of a complete examination. Limited examinations for reoccurring indications may be performed as noted.  Duplex Findings: +--------------------+--------+--------+------+ Mesenteric          PSV cm/sEDV cm/sPlaque +--------------------+--------+--------+------+ Aorta Prox             96                   +--------------------+--------+--------+------+ Celiac Artery Origin  186                  +--------------------+--------+--------+------+ SMA Proximal          301                  +--------------------+--------+--------+------+   +------------------+--------+--------+-------+ Right Renal ArteryPSV cm/sEDV cm/sComment +------------------+--------+--------+-------+ Origin  85.00   23.00          +------------------+--------+--------+-------+ Proximal           56.30   10.10          +------------------+--------+--------+-------+ Mid                64.00   10.10          +------------------+--------+--------+-------+ Distal             53.00   11.80          +------------------+--------+--------+-------+ [place]um              38.00   11.00          +------------------+--------+--------+-------+ +-----------------+--------+--------+-------+ Left Renal ArteryPSV cm/sEDV cm/sComment +-----------------+--------+--------+-------+ Origin            72.00   11.00          +-----------------+--------+--------+-------+ Proximal          67.00   14.40          +-----------------+--------+--------+-------+ MId               71.80   12.60          +-----------------+--------+--------+-------+ Distal            58.00    9.57          +-----------------+--------+--------+-------+ Hilum             31.00    7.00          +-----------------+--------+--------+-------+  RT Kidney Length 11.00  LT Kidney Length 12.10 +------------+--------+--------+----+-----------+-----+----+--+ Right KidneyPSV cm/sEDV cm/s RI Left Kidney PSV EDV RI +------------+--------+--------+----+-----------+-----+----+--+ Upper Pole   27.50    8.60  0.69Upper Pole 28.30       +------------+--------+--------+----+-----------+-----+----+--+ Mid           26.0     0            Mid    28.005.49    +------------+--------+--------+----+-----------+-----+----+--+ Lower Pole    21.0     0        Lower Pole 21.103.57   +------------+--------+--------+----+-----------+-----+----+--+ +------------+----++-----------+----+ Right Kidney    Left Kidney     +------------+----++-----------+----+ Cortex      16/7Cortex     12/4 +------------+----++-----------+----+ FINAL INTERPRETATION: Renal:  Right: No evidence of right renal artery stenosis. Abnormal right        Resistive Index. Diffuse increased echogenicity of the renal        parenchyma. Left:  No evidence of left renal artery stenosis. Abnormal left        Resisitve Index. Diffuse increased echogenicity of the renal        parenchyma. 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  *See table(s) above for measurements and observations.  Diagnosing physician: Fabienne Bruns  Electronically signed by Fabienne Bruns on 07/13/2017 at 8:00:25 PM.   Final    Cardiac Studies   ECHO 07/13/2017  - Left ventricle: The cavity size was mildly dilated. There was   mild concentric hypertrophy. Systolic function was mildly   reduced. The estimated ejection fraction was in the range of 45%   to 50%. Hypokinesis of the basal-midinferolateral and inferior   myocardium. Features are consistent with a pseudonormal left   ventricular filling pattern, with concomitant abnormal relaxation   and increased filling pressure (grade 2 diastolic dysfunction). - Ventricular septum: Septal motion showed paradox. These changes   are consistent with a post-thoracotomy  state. - Mitral valve: There was mild to moderate regurgitation. - Left atrium: The atrium was mildly dilated. - Right ventricle: Systolic function was moderately reduced. - Right atrium: The atrium was mildly dilated. - Tricuspid valve: There was moderate regurgitation directed   centrally. - Pulmonary arteries: Systolic pressure was moderately increased.   PA peak  pressure: 64 mm Hg (S).  Impressions:  - Re-evaluation of the 2017 echo shows that there was subtle   inferior and infeolateral hypokinesis at that time. There is   however definite interval LV dilation and worsening of overall LV   systolic function.  Results for ZALAN, SHIDLER (MRN [id number]) as of 07/14/2017 17:29  Ref. Range 07/12/2017 16:49  ANCA Proteinase 3 Latest Ref Range: 0.0 - 3.5 U/mL <3.5  ASO Latest Ref Range: 0.0 - 200.0 IU/mL <20.0  ds DNA Ab Latest Ref Range: 0 - 9 IU/mL 2  GBM Ab Latest Ref Range: 0 - 20 units 3  Myeloperoxidase Abs Latest Ref Range: 0.0 - 9.0 U/mL <9.0    Patient Profile     77 y.o. male coronary artery disease and remote bypass surgery, chronic diastolic heart failure, chronic kidney disease with recent progressive worsening, PAD status post bilateral iliac artery percutaneous revascularization, paroxysmal atrial fibrillation on chronic warfarin, admitted on February 11 with severe epistaxis, anemia (hemoglobin as low as 6.6), hypoxia and bilateral lung infiltrates, excessive hypoprothrombinemia (INR peaked at 5.4), substantial worsening of renal failure with creatinine up to 7.8.  Assessment & Plan    1. CHF: There is some suggestion of subjective improvement in dyspnea as well as partial improvement in the chest x-ray infiltrates.  Clearly his hypoxia is at least partly related to acute pulmonary edema.  Thankfully he continues to have excellent diuresis in face of advancing renal failure.  Continue diuretics.  Echo shows LVEF around 45%. 2. CAD: Has not been a complaint.  Although there is a slight reduction in global left ventricular function, there are no new regional wall motion abnormalities.  Suspect LV dilation and decline in LV function are related to increased demand. 3. AFib: No recurrence while in the hospital.  Chronically on amiodarone and warfarin. 4. Anticoagulation/excessive hypoprothrombinemia: Supratherapeutic on arrival,  improving after vitamin K ,with INR down to 3.4. 5. Anemia: Even after 2 units of PRBC his hemoglobin is quite low at 7.5.  Ideally would try to keep hemoglobin over 8 in the setting of acute heart failure. 6. ARF: Renal failure has been steadily progressing for a couple of months, seems to have had a pattern of contrast-induced nephrotoxicity, but has been continuing to worsening despite the absence of new contrast exposure.  No evidence of renal artery stenosis on ultrasound, but increased resistive indices are consistent with advanced parenchymal disease.  Thankfully despite severe elevation in BUN and creatinine he is nonoliguric and responding well to diuretics.  Creatinine has plateaued and hopefully will soon see it improve. 7. HTN: He remains hypertensive, but blood pressure is substantially better.  Avoid rapid drops in systolic blood pressure in the setting of acute renal insufficiency.  For questions or updates, please contact [place] Please consult [email or web address] for contact info under Cardiology/STEMI.      Signed, Edward Klein, MD  07/14/2017, 5:27 PM

## 2017-07-14 NOTE — Progress Notes (Signed)
S:Had some SOB earlier this morning and gagging on sputum but better now O:BP (!) 154/64 (BP Location: Left Leg)   Pulse 68   Temp 98.1 F (36.7 C) (Oral)   Resp (!) 22   Ht 5\' 8"  (1.727 m)   Wt 78.6 kg (173 lb 4.5 oz)   SpO2 92%   BMI 26.35 kg/m   Intake/Output Summary (Last 24 hours) at 07/14/2017 1219 Last data filed at 07/14/2017 1115 Gross per 24 hour  Intake 443 ml  Output 2875 ml  Net -2432 ml   Intake/Output: I/O last 3 completed shifts: In: 1648.7 [P.O.:380; I.V.:53; Blood:715.7; IV Piggyback:500] Out: 3475 [Urine:3475]  Intake/Output this shift:  Total I/O In: 3 [I.V.:3] Out: 750 [Urine:750] Weight change: -1.1 kg (-6.8 oz) Gen: NAD CVS: no rub Resp: bibasilar crackles Abd: benign Ext: 2+ edema  Recent Labs  Lab 07/25/2017 1640 07/12/17 0338 07/13/17 0417 07/13/17 1129 07/14/17 0449  NA 136 140 137  --  134*  K 4.4 4.7 4.8  --  4.9  CL 107 111 108  --  105  CO2 16* 15* 15*  --  14*  GLUCOSE 182* 154* 125*  --  131*  BUN 70* 72* 78*  --  84*  CREATININE 7.28* 7.32* 7.52*  --  7.79*  ALBUMIN  --  2.1* 2.0* 2.1* 2.0*  CALCIUM 8.2* 7.8* 8.1*  --  8.3*  PHOS  --  5.6* 5.8*  --  6.9*  AST  --  28  --  35  --   ALT  --  19  --  21  --    Liver Function Tests: Recent Labs  Lab 07/12/17 0338 07/13/17 0417 07/13/17 1129 07/14/17 0449  AST 28  --  35  --   ALT 19  --  21  --   ALKPHOS 83  --  95  --   BILITOT 1.1  --  2.0*  --   PROT 5.4*  --  5.6*  --   ALBUMIN 2.1* 2.0* 2.1* 2.0*   No results for input(s): LIPASE, AMYLASE in the last 168 hours. No results for input(s): AMMONIA in the last 168 hours. CBC: Recent Labs  Lab 07/12/17 0338 07/12/17 0937 07/13/17 0417 07/13/17 1129 07/13/17 1535 07/14/17 0449  WBC 18.2* 19.7* 20.9*  --  18.1* 19.1*  HGB 6.4* 7.1* 6.4* 7.5* 7.6* 7.5*  HCT 19.5* 21.6* 19.3* 22.4* 23.0* 22.5*  MCV 93.8 93.5 93.7  --  92.0 92.2  PLT 326 365 342  --  324 348   Cardiac Enzymes: Recent Labs  Lab 07/21/2017 2242  07/12/17 0338 07/12/17 0937  TROPONINI 0.11* 0.13* 0.13*   CBG: Recent Labs  Lab 07/13/17 0738 07/13/17 1204 07/13/17 1619 07/13/17 2109 07/14/17 1127  GLUCAP 110* 150* 115* 116* 135*    Iron Studies:  Recent Labs    07/08/2017 2242  IRON 21*  TIBC 234*  FERRITIN 770*   Studies/Results: Dg Chest Port 1 View  Result Date: 07/14/2017 CLINICAL DATA:  Hypoxia EXAM: PORTABLE CHEST 1 VIEW COMPARISON:  July 11, 2017 and September 23, 2016 FINDINGS: There is extensive interstitial edema throughout the lungs diffusely. There is airspace opacity throughout portions of the left mid and lower lung zones as well as in the right base. Note that there has been partial clearing of airspace opacity from the right mid lower lung zones. There are minimal pleural effusions bilaterally. There is cardiomegaly with pulmonary venous hypertension. Patient is status post coronary artery bypass  grafting. There is aortic atherosclerosis. No evident adenopathy. There is extensive degenerative change in both shoulders, more severe on the left than on the right, stable. IMPRESSION: Pulmonary vascular congestion. Minimal pleural effusions bilaterally. Areas of airspace opacity bilaterally, stable on the left with partial clearing on the right. There is extensive underlying interstitial edema, stable. The present changes may be due entirely to congestive heart failure with partial clearing of alveolar edema on the right. There may also be a degree of superimposed pneumonia, currently more severe on the left than on the right. Both congestive heart failure and pneumonia may be present concurrently. Patient is status post coronary artery bypass grafting. There is aortic atherosclerosis. Aortic Atherosclerosis (ICD10-I70.0). Electronically Signed   By: Bretta Bang III M.D.   On: 07/14/2017 09:33   Vas US Renal Artery Duplex  Result Date: 07/13/2017 ABDOMINAL VISCERAL Other Indication: Hypertension with known advanced  chronic kidney disease. High Risk Factors: Coronary artery disease.  Limitations: Air/bowel gas.  Examination Guidelines: A complete evaluation includes B-mode imaging, spectral doppler, color doppler, and power doppler as needed of all accessible portions of each vessel. Bilateral testing is considered an integral part of a complete examination. Limited examinations for reoccurring indications may be performed as noted.  Duplex Findings: +--------------------+--------+--------+------+ Mesenteric          PSV cm/sEDV cm/sPlaque +--------------------+--------+--------+------+ Aorta Prox             96                  +--------------------+--------+--------+------+ Celiac Artery Origin  186                  +--------------------+--------+--------+------+ SMA Proximal          301                  +--------------------+--------+--------+------+   +------------------+--------+--------+-------+ Right Renal ArteryPSV cm/sEDV cm/sComment +------------------+--------+--------+-------+ Origin             85.00   23.00          +------------------+--------+--------+-------+ Proximal           56.30   10.10          +------------------+--------+--------+-------+ Mid                64.00   10.10          +------------------+--------+--------+-------+ Distal             53.00   11.80          +------------------+--------+--------+-------+ Hilum              38.00   11.00          +------------------+--------+--------+-------+ +-----------------+--------+--------+-------+ Left Renal ArteryPSV cm/sEDV cm/sComment +-----------------+--------+--------+-------+ Origin            72.00   11.00          +-----------------+--------+--------+-------+ Proximal          67.00   14.40          +-----------------+--------+--------+-------+ MId               71.80   12.60          +-----------------+--------+--------+-------+ Distal            58.00    9.57           +-----------------+--------+--------+-------+ [place]             31.00    7.00          +-----------------+--------+--------+-------+  RT Kidney Length 11.00  LT Kidney Length 12.10 +------------+--------+--------+----+-----------+-----+----+--+ Right KidneyPSV cm/sEDV cm/s RI Left Kidney PSV EDV RI +------------+--------+--------+----+-----------+-----+----+--+ Upper Pole   27.50    8.60  0.69Upper Pole 28.30       +------------+--------+--------+----+-----------+-----+----+--+ Mid           26.0     0            Mid    28.005.49   +------------+--------+--------+----+-----------+-----+----+--+ Lower Pole    21.0     0        Lower [place] 21.103.57   +------------+--------+--------+----+-----------+-----+----+--+ +------------+----++-----------+----+ Right Kidney    Left Kidney     +------------+----++-----------+----+ Cortex      16/7Cortex     12/4 +------------+----++-----------+----+ FINAL INTERPRETATION: Renal:  Right: No evidence of right renal artery stenosis. Abnormal right        Resistive Index. Diffuse increased echogenicity of the renal        parenchyma. Left:  No evidence of left renal artery stenosis. Abnormal left        Resisitve Index. Diffuse increased echogenicity of the renal        parenchyma. 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  *See table(s) above for measurements and observations.  Diagnosing physician: Fabienne Bruns  Electronically signed by Fabienne Bruns on 07/13/2017 at 8:00:25 PM.  Final  . amiodarone  200 mg Oral Daily  . cloNIDine  0.1 mg Transdermal Weekly  . cloNIDine  0.1 mg Oral BID  . ezetimibe  5 mg Oral Daily  . feeding supplement (ENSURE ENLIVE)  237 mL Oral BID BM  . furosemide  80 mg Intravenous BID  . guaiFENesin  600 mg Oral BID  . insulin aspart  0-5 Units Subcutaneous QHS  . insulin aspart  0-9 Units Subcutaneous TID WC  . ipratropium-albuterol  3 mL Nebulization TID  . sodium  chloride flush  3 mL Intravenous Q12H    BMET    Component Value Date/Time   NA 134 (L) 07/14/2017 0449   NA 136 05/25/2017 1028   K 4.9 07/14/2017 0449   CL 105 07/14/2017 0449   CO2 14 (L) 07/14/2017 0449   GLUCOSE 131 (H) 07/14/2017 0449   BUN 84 (H) 07/14/2017 0449   BUN 42 (H) 05/25/2017 1028   CREATININE 7.79 (H) 07/14/2017 0449   CREATININE 2.64 (H) 11/01/2016 0838   CALCIUM 8.3 (L) 07/14/2017 0449   GFRNONAA 6 (L) 07/14/2017 0449   GFRNONAA 23 (L) 11/01/2016 0838   GFRAA 7 (L) 07/14/2017 0449   GFRAA 26 (L) 11/01/2016 0838   CBC    Component Value Date/Time   WBC 19.1 (H) 07/14/2017 0449   RBC 2.44 (L) 07/14/2017 0449   HGB 7.5 (L) 07/14/2017 0449   HGB 13.4 03/17/2010 1538   HCT 22.5 (L) 07/14/2017 0449   HCT 39.3 03/17/2010 1538   PLT 348 07/14/2017 0449   PLT 205 03/17/2010 1538   MCV 92.2 07/14/2017 0449   MCV 92.2 03/17/2010 1538   MCH 30.7 07/14/2017 0449   MCHC 33.3 07/14/2017 0449   RDW 15.3 07/14/2017 0449   RDW 12.6 03/17/2010 1538   LYMPHSABS 1,066 11/01/2016 0838   LYMPHSABS 1.3 03/17/2010 1538   MONOABS 1,066 (H) 11/01/2016 0838   MONOABS 1.0 (H) 03/17/2010 1538   EOSABS 410 11/01/2016 0838   EOSABS 0.9 (H) 03/17/2010 1538   BASOSABS 0 11/01/2016 0838   BASOSABS 0.0 03/17/2010 1538    Assessment/Plan: 1. Progressive CKD- accelerated  rise in Scr over the last 6 months and more recent accelerated HTN. DDx RAS (has history of ASCVD), atheroembolic disease (although denies any embolic changes to his toes following lower ext angiogram), acute GN/RPGN. Will order renal artery duplex and some serologies. His primary Nephrologist, Dr. Hyman Hopes was kind enough to speak with Mr and Mrs. Casimiro Needle regarding his worsening renal function and Mr. Dealmeida was clear that he was not interested in dialysis and understood the consequences but is willing to have other studies to be done to see if this is reversible. He is currently without uremic symptoms. 1. All  serologies and complement levels were normal, no evidence of acute GN 2. Remains firm in decision not to pursue RRT even temporarily. 3. Renal artery duplex negative for significant RAS 4. Continue with conservative treatment and if he progresses further will consult hospice services. 2. Epistaxis- possibly related to accelerated HTN. ENT consulted 3. Accelerated HTN- as above. He has multiple drug intolerances. Continue with his outpatient regimen and agree with starting catapress patch. Negative renal artery duplex 07/13/17. 4. ABLA- likely due to epistaxis although will r/o acute GN 5. Acute on chronic diastolic CHF- restart lasix and follow.  ECHO by Cardiology today.on hold for now but may need to resume for volume 6. CAP- on rocephin and azithromycin 7. A fib on coumadin- currently on hold due to epistaxis and ABLA 8. Elevated troponin- Cardiology has been consulted.  9. 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  10. Disposition- if pt's renal function continues to deteriorate, will need to consult hospice services.   Irena Cords, MD [employer] [phone]

## 2017-07-15 DIAGNOSIS — N189 Chronic kidney disease, unspecified: Secondary | ICD-10-CM

## 2017-07-15 DIAGNOSIS — I5043 Acute on chronic combined systolic (congestive) and diastolic (congestive) heart failure: Secondary | ICD-10-CM

## 2017-07-15 DIAGNOSIS — J189 Pneumonia, unspecified organism: Secondary | ICD-10-CM

## 2017-07-15 LAB — RENAL FUNCTION PANEL
Albumin: 2.1 g/dL — ABNORMAL LOW (ref 3.5–5.0)
Anion gap: 18 — ABNORMAL HIGH (ref 5–15)
BUN: 98 mg/dL — ABNORMAL HIGH (ref 6–20)
CHLORIDE: 104 mmol/L (ref 101–111)
CO2: 13 mmol/L — AB (ref 22–32)
CREATININE: 8.49 mg/dL — AB (ref 0.61–1.24)
Calcium: 8.6 mg/dL — ABNORMAL LOW (ref 8.9–10.3)
GFR calc non Af Amer: 5 mL/min — ABNORMAL LOW (ref >60)
GFR, EST AFRICAN AMERICAN: 6 mL/min — AB (ref >60)
Glucose, Bld: 132 mg/dL — ABNORMAL HIGH (ref 65–99)
POTASSIUM: 4.8 mmol/L (ref 3.5–5.1)
Phosphorus: 7.9 mg/dL — ABNORMAL HIGH (ref 2.5–4.6)
Sodium: 135 mmol/L (ref 135–145)

## 2017-07-15 LAB — CBC
HEMATOCRIT: 24.6 % — AB (ref 39.0–52.0)
Hemoglobin: 8.1 g/dL — ABNORMAL LOW (ref 13.0–17.0)
MCH: 30.6 pg (ref 26.0–34.0)
MCHC: 32.9 g/dL (ref 30.0–36.0)
MCV: 92.8 fL (ref 78.0–100.0)
Platelets: 393 10*3/uL (ref 150–400)
RBC: 2.65 MIL/uL — AB (ref 4.22–5.81)
RDW: 15.2 % (ref 11.5–15.5)
WBC: 20.6 10*3/uL — ABNORMAL HIGH (ref 4.0–10.5)

## 2017-07-15 LAB — GLUCOSE, CAPILLARY
GLUCOSE-CAPILLARY: 125 mg/dL — AB (ref 65–99)
GLUCOSE-CAPILLARY: 126 mg/dL — AB (ref 65–99)
GLUCOSE-CAPILLARY: 140 mg/dL — AB (ref 65–99)

## 2017-07-15 LAB — PROTIME-INR
INR: 2.02
Prothrombin Time: 22.7 seconds — ABNORMAL HIGH (ref 11.4–15.2)

## 2017-07-15 LAB — ANTINUCLEAR ANTIBODIES, IFA: ANTINUCLEAR ANTIBODIES, IFA: POSITIVE — AB

## 2017-07-15 LAB — FANA STAINING PATTERNS

## 2017-07-15 MED ORDER — FUROSEMIDE 10 MG/ML IJ SOLN
80.0000 mg | Freq: Every day | INTRAMUSCULAR | Status: DC
Start: 2017-07-16 — End: 2017-07-17
  Administered 2017-07-16: 80 mg via INTRAVENOUS
  Filled 2017-07-15: qty 8

## 2017-07-15 MED ORDER — BISOPROLOL FUMARATE 5 MG PO TABS
5.0000 mg | ORAL_TABLET | Freq: Every day | ORAL | Status: DC
  Administered 2017-07-15 – 2017-07-16 (×2): 5 mg via ORAL
  Filled 2017-07-15 (×2): qty 1

## 2017-07-15 NOTE — Progress Notes (Signed)
Patient alert and oriented.  Complained of nausea Zofran given per prn order.  Patient switched from HFNC to Venturi mask on 10 LPM oxygen at 45% FIO2.  Oxygen saturations from 80's to mid 90's.  Reminded to take deep breaths.  Doxycycline continues to infuse per order.  No adverse effects noted from antibiotic therapy.  Safety and comfort measures maintained.  Call bell within reach.  Will continue to monitor.  Wife visiting earlier this evening.

## 2017-07-15 NOTE — Progress Notes (Signed)
Patient alert and pleasant.  Patient remains on 10 lpm oxygen via venturi-mask 45% FIO2.  Desaturations were decreased tonight by using this venturi mask, patient tolerated the mask well.  External catheter remains intact draining clear yellow urine.  Safety and comfort measures maintained.  Call bell within reach.  No voiced complaints of pain, patient rested comfortably this shift.  No signs or symptosm of respiratory distress.

## 2017-07-15 NOTE — Progress Notes (Signed)
S: continuing to have anorexia and nausea/vomiting today. O:BP 136/73   Pulse 78   Temp 98 F (36.7 C) (Oral)   Resp 19   Ht 5\' 8"  (1.727 m)   Wt 76.8 kg (169 lb 5 oz)   SpO2 96%   BMI 25.74 kg/m   Intake/Output Summary (Last 24 hours) at 07/15/2017 1505 Last data filed at 07/15/2017 0815 Gross per 24 hour  Intake 370 ml  Output 2050 ml  Net -1680 ml   Intake/Output: I/O last 3 completed shifts: In: 813 [P.O.:300; I.V.:13; IV Piggyback:500] Out: 2825 [Urine:2825]  Intake/Output this shift:  Total I/O In: -  Out: 1350 [Urine:1350] Weight change: -0.6 kg (-5.2 oz) Gen: NAD CVS: IRR IRR, no rub Resp: bibasilar crackles Abd: benign Ext: 1+ edema  Recent Labs  Lab 2017/07/26 1640 07/12/17 0338 07/13/17 0417 07/13/17 1129 07/14/17 0449 07/15/17 0638  NA 136 140 137  --  134* 135  K 4.4 4.7 4.8  --  4.9 4.8  CL 107 111 108  --  105 104  CO2 16* 15* 15*  --  14* 13*  GLUCOSE 182* 154* 125*  --  131* 132*  BUN 70* 72* 78*  --  84* 98*  CREATININE 7.28* 7.32* 7.52*  --  7.79* 8.49*  ALBUMIN  --  2.1* 2.0* 2.1* 2.0* 2.1*  CALCIUM 8.2* 7.8* 8.1*  --  8.3* 8.6*  PHOS  --  5.6* 5.8*  --  6.9* 7.9*  AST  --  28  --  35  --   --   ALT  --  19  --  21  --   --    Liver Function Tests: Recent Labs  Lab 07/12/17 0338  07/13/17 1129 07/14/17 0449 07/15/17 0638  AST 28  --  35  --   --   ALT 19  --  21  --   --   ALKPHOS 83  --  95  --   --   BILITOT 1.1  --  2.0*  --   --   PROT 5.4*  --  5.6*  --   --   ALBUMIN 2.1*   < > 2.1* 2.0* 2.1*   < > = values in this interval not displayed.   No results for input(s): LIPASE, AMYLASE in the last 168 hours. No results for input(s): AMMONIA in the last 168 hours. CBC: Recent Labs  Lab 07/12/17 0937 07/13/17 0417  07/13/17 1535 07/14/17 0449 07/15/17 0638  WBC 19.7* 20.9*  --  18.1* 19.1* 20.6*  HGB 7.1* 6.4*   < > 7.6* 7.5* 8.1*  HCT 21.6* 19.3*   < > 23.0* 22.5* 24.6*  MCV 93.5 93.7  --  92.0 92.2 92.8  PLT 365 342   --  324 348 393   < > = values in this interval not displayed.   Cardiac Enzymes: Recent Labs  Lab 07-26-2017 2242 07/12/17 0338 07/12/17 0937  TROPONINI 0.11* 0.13* 0.13*   CBG: Recent Labs  Lab 07/13/17 2109 07/14/17 1127 07/14/17 1637 07/15/17 0812 07/15/17 0951  GLUCAP 116* 135* 121* 125* 126*    Iron Studies: No results for input(s): IRON, TIBC, TRANSFERRIN, FERRITIN in the last 72 hours. Studies/Results: Dg Chest Port 1 View  Result Date: 07/14/2017 CLINICAL DATA:  Hypoxia EXAM: PORTABLE CHEST 1 VIEW COMPARISON:  07-26-17 and September 23, 2016 FINDINGS: There is extensive interstitial edema throughout the lungs diffusely. There is airspace opacity throughout portions of the left  mid and lower lung zones as well as in the right base. Note that there has been partial clearing of airspace opacity from the right mid lower lung zones. There are minimal pleural effusions bilaterally. There is cardiomegaly with pulmonary venous hypertension. Patient is status post coronary artery bypass grafting. There is aortic atherosclerosis. No evident adenopathy. There is extensive degenerative change in both shoulders, more severe on the left than on the right, stable. IMPRESSION: Pulmonary vascular congestion. Minimal pleural effusions bilaterally. Areas of airspace opacity bilaterally, stable on the left with partial clearing on the right. There is extensive underlying interstitial edema, stable. The present changes may be due entirely to congestive heart failure with partial clearing of alveolar edema on the right. There may also be a degree of superimposed pneumonia, currently more severe on the left than on the right. Both congestive heart failure and pneumonia may be present concurrently. Patient is status post coronary artery bypass grafting. There is aortic atherosclerosis. Aortic Atherosclerosis (ICD10-I70.0). Electronically Signed   By: Bretta Bang III M.D.   On: 07/14/2017 09:33    . amiodarone  200 mg Oral Daily  . bisoprolol  5 mg Oral Daily  . cloNIDine  0.1 mg Transdermal Weekly  . cloNIDine  0.1 mg Oral BID  . ezetimibe  5 mg Oral Daily  . feeding supplement (ENSURE ENLIVE)  237 mL Oral BID BM  . furosemide  80 mg Intravenous BID  . guaiFENesin  600 mg Oral BID  . insulin aspart  0-5 Units Subcutaneous QHS  . insulin aspart  0-9 Units Subcutaneous TID WC  . ipratropium-albuterol  3 mL Nebulization TID  . sodium chloride flush  3 mL Intravenous Q12H    BMET    Component Value Date/Time   NA 135 07/15/2017 0638   NA 136 05/25/2017 1028   K 4.8 07/15/2017 0638   CL 104 07/15/2017 0638   CO2 13 (L) 07/15/2017 0638   GLUCOSE 132 (H) 07/15/2017 0638   BUN 98 (H) 07/15/2017 0638   BUN 42 (H) 05/25/2017 1028   CREATININE 8.49 (H) 07/15/2017 0638   CREATININE 2.64 (H) 11/01/2016 0838   CALCIUM 8.6 (L) 07/15/2017 0638   GFRNONAA 5 (L) 07/15/2017 0638   GFRNONAA 23 (L) 11/01/2016 0838   GFRAA 6 (L) 07/15/2017 0638   GFRAA 26 (L) 11/01/2016 0838   CBC    Component Value Date/Time   WBC 20.6 (H) 07/15/2017 0638   RBC 2.65 (L) 07/15/2017 0638   HGB 8.1 (L) 07/15/2017 0638   HGB 13.4 03/17/2010 1538   HCT 24.6 (L) 07/15/2017 0638   HCT 39.3 03/17/2010 1538   PLT 393 07/15/2017 0638   PLT 205 03/17/2010 1538   MCV 92.8 07/15/2017 0638   MCV 92.2 03/17/2010 1538   MCH 30.6 07/15/2017 0638   MCHC 32.9 07/15/2017 0638   RDW 15.2 07/15/2017 0638   RDW 12.6 03/17/2010 1538   LYMPHSABS 1,066 11/01/2016 0838   LYMPHSABS 1.3 03/17/2010 1538   MONOABS 1,066 (H) 11/01/2016 0838   MONOABS 1.0 (H) 03/17/2010 1538   EOSABS 410 11/01/2016 0838   EOSABS 0.9 (H) 03/17/2010 1538   BASOSABS 0 11/01/2016 0838   BASOSABS 0.0 03/17/2010 1538     Assessment/Plan: 1. Progressive CKD- accelerated rise in Scr over the last 6 months and more recent accelerated HTN. 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 Will order renal artery duplex  and some serologies. His primary Nephrologist, Dr. Hyman Hopes was kind enough to speak with Mr and Mrs. Casimiro Needle regarding his worsening renal function and Mr. Dugar was clear that he was not interested in dialysis and understood the consequences but is willing to have other studies to be done to see if this is reversible. He is currently without uremic symptoms. 1. All serologies and complement levels were normal, no evidence of acute GN 2. Remains firm in decision not to pursue RRT even temporarily. 3. Renal artery duplex negative for significant RAS 4. Continue with conservative treatment and consult hospice services. 2. Epistaxis- possibly related to accelerated HTN. ENT consulted 3. Accelerated HTN- as above. He has multiple drug intolerances. Continue with his outpatient regimen and agree with starting catapress patch.Negativerenal artery duplex 07/13/17. 4. ABLA- likely due to epistaxis although will r/o acute GN 5. Acute on chronic diastolic CHF-continuelasixbut decrease frequency and follow.  6. CAP- on rocephin and azithromycin 7. A fib on coumadin- currently on hold due to epistaxis and ABLA 8. Elevated troponin- Cardiology has been consulted.  9. 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  10. Disposition- will need to consult hospice services given his worsening renal function and the development of uremic symptoms.   Irena Cords, MD [employer] [phone]

## 2017-07-15 NOTE — Progress Notes (Signed)
Palliative Medicine consult noted. Due to high referral volume, there may be a delay seeing this patient. Please call the Palliative Medicine Team office at [phone] if recommendations are needed in the interim.  Thank you for inviting [place] to see this patient.  Margret Chance Mikailah Morel, RN, BSN, [facility] Palliative Medicine Team 07/15/2017 2:01 PM Office [phone]

## 2017-07-15 NOTE — Progress Notes (Signed)
Progress Note  Patient Name: [name] Date of Encounter: 07/15/2017  Primary Cardiologist: Lesleigh Noe, MD   Subjective   Still very dyspneic. Coughing gray sputum, very tenacious, not hemoptoic. Fair UO, although volume drifting lower. Creat has worsened further. In atrial fibrillation since yesterday PM.  Inpatient Medications    Scheduled Meds: . amiodarone  200 mg Oral Daily  . cloNIDine  0.1 mg Transdermal Weekly  . cloNIDine  0.1 mg Oral BID  . ezetimibe  5 mg Oral Daily  . feeding supplement (ENSURE ENLIVE)  237 mL Oral BID BM  . furosemide  80 mg Intravenous BID  . guaiFENesin  600 mg Oral BID  . insulin aspart  0-5 Units Subcutaneous QHS  . insulin aspart  0-9 Units Subcutaneous TID WC  . ipratropium-albuterol  3 mL Nebulization TID  . sodium chloride flush  3 mL Intravenous Q12H   Continuous Infusions: . sodium chloride 10 mL/hr (07/12/17 1730)  . cefTRIAXone (ROCEPHIN)  IV Stopped (07/14/17 1741)  . doxycycline (VIBRAMYCIN) IV Stopped (07/14/17 2335)   PRN Meds: sodium chloride, acetaminophen **OR** acetaminophen, bisacodyl, HYDROcodone-acetaminophen, levalbuterol, ondansetron **OR** ondansetron (ZOFRAN) IV, sodium chloride, sodium chloride flush   Vital Signs    Vitals:   07/15/17 0300 07/15/17 0356 07/15/17 0759 07/15/17 0816  BP: (!) 180/83 (!) 164/88    Pulse: (!) 116 (!) 103 (!) 101   Resp: 20 19 (!) 21   Temp: 99.6 F (37.6 C)   98 F (36.7 C)  TempSrc: Oral   Oral  SpO2: 92% (!) 87% 94%   Weight:      Height:        Intake/Output Summary (Last 24 hours) at 07/15/2017 0856 Last data filed at 07/15/2017 0815 Gross per 24 hour  Intake 373 ml  Output 2800 ml  Net -2427 ml   Filed Weights   07/14/17 0117 07/15/17 0047 07/15/17 0114  Weight: 173 lb 4.5 oz (78.6 kg) 171 lb 15.3 oz (78 kg) 169 lb 5 oz (76.8 kg)    Telemetry    atrial fibrillation with mild RVR - Personally Reviewed  ECG    atrial fibrillation, RVR,  nonspecific IVCD (QRS 124 ms, only slightly broader tan in NSR), nonspecific ST changes, mildly prolonged QT 487 ms). Pending  Physical Exam  Tachypneic, but able to hold a conversation in uninterrupted sentences GEN: No acute distress.   Neck: No JVD Cardiac: irregular, 2/6 systolic ejection murmur RSB, no diastolic murmurs, rubs, or gallops.  Respiratory: diminished posterior bases, but otherwise clear to auscultation bilaterally. GI: Soft, nontender, non-distended  MS: No edema; No deformity. Neuro:  Nonfocal  Psych: Normal affect   Labs    Chemistry Recent Labs  Lab 07/12/17 0338 07/13/17 0417 07/13/17 1129 07/14/17 0449 07/15/17 0638  NA 140 137  --  134* 135  K 4.7 4.8  --  4.9 4.8  CL 111 108  --  105 104  CO2 15* 15*  --  14* 13*  GLUCOSE 154* 125*  --  131* 132*  BUN 72* 78*  --  84* 98*  CREATININE 7.32* 7.52*  --  7.79* 8.49*  CALCIUM 7.8* 8.1*  --  8.3* 8.6*  PROT 5.4*  --  5.6*  --   --   ALBUMIN 2.1* 2.0* 2.1* 2.0* 2.1*  AST 28  --  35  --   --   ALT 19  --  21  --   --   ALKPHOS 83  --  95  --   --   BILITOT 1.1  --  2.0*  --   --   GFRNONAA 6* 6*  --  6* 5*  GFRAA 7* 7*  --  7* 6*  ANIONGAP 14 14  --  15 18*     Hematology Recent Labs  Lab 07/13/17 1535 07/14/17 0449 07/15/17 0638  WBC 18.1* 19.1* 20.6*  RBC 2.50* 2.44* 2.65*  HGB 7.6* 7.5* 8.1*  HCT 23.0* 22.5* 24.6*  MCV 92.0 92.2 92.8  MCH 30.4 30.7 30.6  MCHC 33.0 33.3 32.9  RDW 15.1 15.3 15.2  PLT 324 348 393    Cardiac Enzymes Recent Labs  Lab 07/04/2017 2242 07/12/17 0338 07/12/17 0937  TROPONINI 0.11* 0.13* 0.13*    Recent Labs  Lab 07/23/2017 1712  TROPIPOC 0.12*     BNP Recent Labs  Lab 07/08/2017 1726  BNP 2,166.2*     DDimer No results for input(s): DDIMER in the last 168 hours.   Radiology    Dg Chest Port 1 View  Result Date: 07/14/2017 CLINICAL DATA:  Hypoxia EXAM: PORTABLE CHEST 1 VIEW COMPARISON:  July 11, 2017 and September 23, 2016 FINDINGS: There is  extensive interstitial edema throughout the lungs diffusely. There is airspace opacity throughout portions of the left mid and lower lung zones as well as in the right base. Note that there has been partial clearing of airspace opacity from the right mid lower lung zones. There are minimal pleural effusions bilaterally. There is cardiomegaly with pulmonary venous hypertension. Patient is status post coronary artery bypass grafting. There is aortic atherosclerosis. No evident adenopathy. There is extensive degenerative change in both shoulders, more severe on the left than on the right, stable. IMPRESSION: Pulmonary vascular congestion. Minimal pleural effusions bilaterally. Areas of airspace opacity bilaterally, stable on the left with partial clearing on the right. There is extensive underlying interstitial edema, stable. The present changes may be due entirely to congestive heart failure with partial clearing of alveolar edema on the right. There may also be a degree of superimposed pneumonia, currently more severe on the left than on the right. Both congestive heart failure and pneumonia may be present concurrently. Patient is status post coronary artery bypass grafting. There is aortic atherosclerosis. Aortic Atherosclerosis (ICD10-I70.0). Electronically Signed   By: Bretta Bang III M.D.   On: 07/14/2017 09:33   Vas US Renal Artery Duplex  Result Date: 07/13/2017  FINAL INTERPRETATION: Renal:  Right: No evidence of right renal artery stenosis. Abnormal right        Resistive Index. Diffuse increased echogenicity of the renal        parenchyma. Left:  No evidence of left renal artery stenosis. Abnormal left        Resisitve Index. Diffuse increased echogenicity of the renal        parenchyma. 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    Cardiac Studies   ECHO 07/13/2017  - Left ventricle: The cavity size was mildly dilated. There was mild concentric hypertrophy. Systolic  function was mildly reduced. The estimated ejection fraction was in the range of 45% to 50%. Hypokinesis of the basal-midinferolateral and inferior myocardium. Features are consistent with a pseudonormal left ventricular filling pattern, with concomitant abnormal relaxation and increased filling pressure (grade 2 diastolic dysfunction). - Ventricular septum: Septal motion showed paradox. These changes are consistent with a post-thoracotomy state. - Mitral valve: There was mild to moderate regurgitation. - Left atrium: The atrium was mildly  dilated. - Right ventricle: Systolic function was moderately reduced. - Right atrium: The atrium was mildly dilated. - Tricuspid valve: There was moderate regurgitation directed centrally. - Pulmonary arteries: Systolic pressure was moderately increased. PA peak pressure: 64 mm Hg (S).  Impressions:  - Re-evaluation of the 2017 echo shows that there was subtle inferior and infeolateral hypokinesis at that time. There is however definite interval LV dilation and worsening of overall LV systolic function.  Patient Profile     77 y.o. male with coronary artery disease and remote bypass surgery, chronic diastolic heart failure, chronic kidney disease with recent progressive worsening, PAD status post bilateral iliac artery percutaneous revascularization, paroxysmal atrial fibrillation on chronic warfarin, admitted on February 11 with severe epistaxis, anemia (hemoglobin as low as 6.6), hypoxia and bilateral lung infiltrates, excessive hypoprothrombinemia (INR peaked at 5.4), progressive worsening of renal failure with creatinine >8 (declines even temporary HD). Echo shows mild worsening of LV function, EF 45%.  Assessment & Plan    1. CHF: on yesterday's CXR there was some suggestion of  partial improvement in the chest x-ray infiltrates.  Clearly his hypoxia is at least partly related to acute pulmonary edema.  Thankfully he  continues to have excellent diuresis in face of advancing renal failure.  Continue diuretics - almost back to admission weight.  Echo shows LVEF around 45%. 2. CAD: Angina has not been a complaint.  Although there is a slight reduction in global left ventricular function, there are no new regional wall motion abnormalities.  Suspect LV dilation and decline in LV function are related to increased demand (anemia, HTN, volume overload) 3. AFib: Appears to be in atrial fibrillation since yesterday PM. Confirm with ECG. Chronically on amiodarone (for about one year) and warfarin. INR 2.0 today, likely to continue decreasing due to vitamin K. Consider IV heparin. Epistaxis seems to have stopped. 4. Anemia: Hgb stable/slightly better at 8.1. Keep >8, for CHF. 6. ARF: unfortunately, renal parameters continue to deteriorate. We have a fighting chance as long as he still has a good urine output, but I doubt this will last much longer. Prognosis is grim without HD. He understands this. 7. HTN: He remains hypertensive, but blood pressure is substantially better than on arrival to the hospital.  Avoid rapid drops in systolic blood pressure in the setting of acute renal insufficiency. He reports a history of metoprolol allergy (rash and swelling of legs). Will switch to bisoprolol - hopefully not a class effect.   For questions or updates, please contact [facility] Please consult [email or web address] for contact info under Cardiology/STEMI.      Signed, Thurmon Fair, MD  07/15/2017, 8:56 AM

## 2017-07-15 NOTE — Progress Notes (Signed)
PROGRESS NOTE   [name]  OAC:[id number]    DOB: 10/20/1940    DOA: 07/08/2017  PCP: Lupita Raider, MD   I have briefly reviewed patients previous medical records in [facility].  Brief Narrative:  77 year old male with PMH of HTN, DM, PAD, CAD status post CABG (Dr. Verdis Prime, Cardiology), stage III CKD (baseline creatinine 1.2-2.5, Dr. Elvis Coil. Nephrology), atrial fibrillation on Coumadin, chronic diastolic CHF, sent to ED from ENT office 07/06/2017 due to dyspnea/hypoxia, anemia and chest x-ray showed multifocal airspace disease concerning for pneumonia versus hemorrhage.  Admitted to stepdown for acute respiratory failure with hypoxia, decompensated CHF, pneumonia versus pulmonary hemorrhage, acute on chronic kidney disease, acute blood loss anemia.  Unfortunately while waiting in the ED, finger got caught in wheelchair causing partial amputation of distal fifth digit and underwent urgent ORIF by hand surgery.  Cardiology and nephrology consulting.  Patient rapidly deteriorating.  After discussing with Cardiology and Nephrology, consulted Palliative Care Team to assist with goals and transition to hospice.   Assessment & Plan:   Active Problems:   Essential hypertension   Hyperlipidemia   CKD (chronic kidney disease), stage IV (HCC)   Type 2 diabetes mellitus with vascular disease (HCC)   Atrial fibrillation (HCC)   Chronic diastolic CHF (congestive heart failure) (HCC)   Anemia   CAP (community acquired pneumonia)   Epistaxis   Acute renal failure (HCC)   Hypoxia   1. Acute respiratory failure with hypoxia: Multifactorial related to decompensated CHF, community-acquired pneumonia and?  Pulmonary hemorrhage in the context of supratherapeutic INR and considering vasculitis.  Treated with IV ceftriaxone, doxycycline, IV Lasix initiated.  This seemed to have improved initially but again worsened.  Had to be on Ventimask last night.  Deteriorating.  Poor overall prognosis.   Discussed in detail with Dr. Coladonato/Nephrology and Dr. Croitoru/Cardiology and agree that patient is most appropriate for palliative care consultation and transitioning to hospice.  Palliative care consulted and I discussed with Dr. Phillips Odor for an early visit. 2. Community-acquired pneumonia: IV ceftriaxone and doxycycline.  Flu panel PCR and RSV panel negative.  Blood cultures x2: Negative to date. 3. Acute on chronic combined systolic & diastolic CHF: Clinically and radiologically volume overloaded. Concerned that diuretics will worsen renal function.  The patient has clarified to more than one physician that he does not wish to go on dialysis despite being aware of risks of deterioration and even death.  IV Lasix 80 mg twice daily.  Intake output, daily weights.  Cardiology input appreciated.  -1.5 L since admission and put out 1.4 L yesterday.  Creatinine has rapidly increased to 8.49.  Chest x-ray 2/14 reviewed.  Some improvement on the right.  Likely combination of pneumonia and CHF.  Poor prognosis. 4. Acute on stage III chronic kidney disease: As per nephrology, accelerated rise in serum creatinine over the last 6 months and more recent HTN.  Renal artery duplex negative for significant RAS.  Vasculitic workup negative as per nephrology.  Patient has clarified that he does not wish to pursue dialysis even a temporary and I confirmed this with him as well.  Creatinine has now increased to 8.5.  Patient now having nausea, possibly uremic.  Nephrology following.  Palliative care consultation. 5. Paroxysmal atrial fibrillation: Continue amiodarone.  Holding Coumadin due to supratherapeutic INR and bleeding risks.  Cardiology following.  Was in sinus rhythm until evening of 2/14 and is reverted to A. fib.  Not starting IV heparin due to  increasing risk of bleeding and overall poor prognosis and considering transitioning to hospice. 6. Supratherapeutic INR: INR had increased from 3.16 on admission to 5.38  on 2/13.  Status post vitamin K 5 mg PO on 2/13 and INR down to 2.02 7. Accelerated hypertension: Difficult to control blood pressure.  Multiple drug allergies.  Continue clonidine.  Negative renal artery duplex 2/13.   8. CAD status post CABG/troponin elevation: Possibly due to demand ischemia from acute illness, acute on chronic kidney disease.  No chest pain or EKG changes.  Not candidate for angiography due to recent severe bleeding and acute renal failure.  2D echo 2/13: LVEF 45-50%, worse than 2017, grade 2 diastolic dysfunction. 9. Epistaxis: Evaluated in office by ENT without clear etiology.  Coagulopathy.  Reversed with vitamin K and monitor closely.  No further epistaxis. 10. Acute blood loss anemia: Likely from epistaxis but nephrology concerned regarding acute GN.  Status post 2 units PRBC.  Hemoglobin up to 7.5 but monitor closely and transfuse to keep hemoglobin greater than 7.  Hemoglobin up to 8. 11. Traumatic right ring finger laceration without tendon involvement & right small finger open distal phalanx fracture: Status post open debridement of right small finger and ORIF 07/23/2017 by Dr. Orlan Leavens who recommended follow-up in office in 7-10 days for wound check, suture removal, therapy evaluation. 12. Leukocytosis: Follow CBCs.  Steadily increasing.   DVT prophylaxis: SCDs. Code Status: DNR. Family Communication: I discussed in detail with patient's spouse.  Updated care and answered questions.  Advised her that patient is rapidly deteriorating, critically ill and has poor prognosis.  They are agreeable to palliative care consultation and are open to going home with hospice. Disposition: To be determined.  If renal functions continued to deteriorate, will need palliative input.   Consultants:  Cardiology Nephrology Hand surgery Palliative care team  Procedures:  None  Antimicrobials:  IV ceftriaxone and doxycycline.   Subjective: Overnight events noted.  Had to be placed on  Ventimask.  Reports ongoing nausea.  2 episodes of nonbloody emesis since yesterday.  Dry mouth.  Dyspnea better.  No nosebleeds.  No pain reported.  ROS: As above  Objective:  Vitals:   07/15/17 0300 07/15/17 0356 07/15/17 0759 07/15/17 0816  BP: (!) 180/83 (!) 164/88    Pulse: (!) 116 (!) 103 (!) 101   Resp: 20 19 (!) 21   Temp: 99.6 F (37.6 C)   98 F (36.7 C)  TempSrc: Oral   Oral  SpO2: 92% (!) 87% 94%   Weight:      Height:        Examination:  General exam: Elderly male, moderately built and nourished, lying propped up in bed without distress.  Ill looking. Respiratory system: Clear to auscultation anteriorly.  Decreased breath sounds in the basis with basal crackles.  No wheezing or rhonchi. Cardiovascular system: S1 & S2 heard, irregularly irregular.  JVD +.  Systolic ejection murmur 2/6 at apex.  1+ pitting bilateral leg edema, decreased compared to yesterday.  Telemetry personally reviewed: A. fib in the 100s. Gastrointestinal system: Abdomen is nondistended, soft and nontender. No organomegaly or masses felt. Normal bowel sounds heard.  Stable without change. Central nervous system: Alert and oriented. No focal neurological deficits.  Stable without change. Extremities: Symmetric 5 x 5 power.  Right lateral 2 fingers.  Dressing clean and dry.  Stable without change. Skin: No rashes, lesions or ulcers Psychiatry: Judgement and insight appear normal. Mood & affect appropriate.  Data Reviewed: I have personally reviewed following labs and imaging studies  CBC: Recent Labs  Lab 07/12/17 0937 07/13/17 0417 07/13/17 1129 07/13/17 1535 07/14/17 0449 07/15/17 0638  WBC 19.7* 20.9*  --  18.1* 19.1* 20.6*  HGB 7.1* 6.4* 7.5* 7.6* 7.5* 8.1*  HCT 21.6* 19.3* 22.4* 23.0* 22.5* 24.6*  MCV 93.5 93.7  --  92.0 92.2 92.8  PLT 365 342  --  324 348 393   Basic Metabolic Panel: Recent Labs  Lab July 15, 2017 1640 07/12/17 0338 07/13/17 0417 07/14/17 0449 07/15/17 0638    NA 136 140 137 134* 135  K 4.4 4.7 4.8 4.9 4.8  CL 107 111 108 105 104  CO2 16* 15* 15* 14* 13*  GLUCOSE 182* 154* 125* 131* 132*  BUN 70* 72* 78* 84* 98*  CREATININE 7.28* 7.32* 7.52* 7.79* 8.49*  CALCIUM 8.2* 7.8* 8.1* 8.3* 8.6*  MG  --  1.9  --   --   --   PHOS  --  5.6* 5.8* 6.9* 7.9*   Liver Function Tests: Recent Labs  Lab 07/12/17 0338 07/13/17 0417 07/13/17 1129 07/14/17 0449 07/15/17 0638  AST 28  --  35  --   --   ALT 19  --  21  --   --   ALKPHOS 83  --  95  --   --   BILITOT 1.1  --  2.0*  --   --   PROT 5.4*  --  5.6*  --   --   ALBUMIN 2.1* 2.0* 2.1* 2.0* 2.1*   Coagulation Profile: Recent Labs  Lab 07-15-2017 1640 07/12/17 0338 07/13/17 1129 07/14/17 0449 07/15/17 0638  INR 3.16 3.73 5.38* 3.38 2.02   Cardiac Enzymes: Recent Labs  Lab 07-15-17 2242 07/12/17 0338 07/12/17 0937  TROPONINI 0.11* 0.13* 0.13*   HbA1C: No results for input(s): HGBA1C in the last 72 hours. CBG: Recent Labs  Lab 07/13/17 2109 07/14/17 1127 07/14/17 1637 07/15/17 0812 07/15/17 0951  GLUCAP 116* 135* 121* 125* 126*    Recent Results (from the past 240 hour(s))  Blood Culture (routine x 2)     Status: None (Preliminary result)   Collection Time: 2017/07/15  5:27 PM  Result Value Ref Range Status   Specimen Description BLOOD SITE NOT SPECIFIED  Final   Special Requests   Final    BOTTLES DRAWN AEROBIC AND ANAEROBIC Blood Culture adequate volume   Culture   Final    NO GROWTH 3 DAYS Performed at [facility], [address] [place], [place] [address]    Report Status PENDING  Incomplete  Respiratory Panel by PCR     Status: None   Collection Time: 07/15/17  9:16 PM  Result Value Ref Range Status   Adenovirus NOT DETECTED NOT DETECTED Final   Coronavirus 229E NOT DETECTED NOT DETECTED Final   Coronavirus HKU1 NOT DETECTED NOT DETECTED Final   Coronavirus NL63 NOT DETECTED NOT DETECTED Final   Coronavirus OC43 NOT DETECTED NOT DETECTED Final    Metapneumovirus NOT DETECTED NOT DETECTED Final   Rhinovirus / Enterovirus NOT DETECTED NOT DETECTED Final   Influenza A NOT DETECTED NOT DETECTED Final   Influenza A H1 NOT DETECTED NOT DETECTED Final   Influenza A H1 2009 NOT DETECTED NOT DETECTED Final   Influenza A H3 NOT DETECTED NOT DETECTED Final   Influenza B NOT DETECTED NOT DETECTED Final   Parainfluenza Virus 1 NOT DETECTED NOT DETECTED Final   Parainfluenza Virus 2 NOT  DETECTED NOT DETECTED Final   Parainfluenza Virus 3 NOT DETECTED NOT DETECTED Final   Parainfluenza Virus 4 NOT DETECTED NOT DETECTED Final   Respiratory Syncytial Virus NOT DETECTED NOT DETECTED Final   Bordetella pertussis NOT DETECTED NOT DETECTED Final   Chlamydophila pneumoniae NOT DETECTED NOT DETECTED Final   Mycoplasma pneumoniae NOT DETECTED NOT DETECTED Final    Comment: Performed at [facility], [address], [place], [place] [address]  Urine Culture     Status: None   Collection Time: 08-10-2017 10:42 PM  Result Value Ref Range Status   Specimen Description URINE, CLEAN CATCH  Final   Special Requests Normal  Final   Culture   Final    NO GROWTH Performed at [facility], [address], [place], [place] [address]    Report Status 07/13/2017 FINAL  Final  MRSA PCR Screening     Status: None   Collection Time: 07/12/17  1:00 AM  Result Value Ref Range Status   MRSA by PCR NEGATIVE NEGATIVE Final    Comment:        The GeneXpert MRSA Assay (FDA approved for NASAL specimens only), is one component of a comprehensive MRSA colonization surveillance program. It is not intended to diagnose MRSA infection nor to guide or monitor treatment for MRSA infections. Performed at [facility], [address], [place], [place] [address]   Culture, blood (Routine X 2) w Reflex to ID Panel     Status: None (Preliminary result)   Collection Time: 07/12/17  4:13 AM  Result Value Ref Range Status   Specimen Description BLOOD RIGHT ARM   Final   Special Requests IN PEDIATRIC BOTTLE Blood Culture adequate volume  Final   Culture   Final    NO GROWTH 2 DAYS Performed at [facility], [address] [place], [place] [address]    Report Status PENDING  Incomplete         Radiology Studies: Dg Chest Port 1 View  Result Date: 07/14/2017 CLINICAL DATA:  Hypoxia EXAM: PORTABLE CHEST 1 VIEW COMPARISON:  2017/08/10 and September 23, 2016 FINDINGS: There is extensive interstitial edema throughout the lungs diffusely. There is airspace opacity throughout portions of the left mid and lower lung zones as well as in the right base. Note that there has been partial clearing of airspace opacity from the right mid lower lung zones. There are minimal pleural effusions bilaterally. There is cardiomegaly with pulmonary venous hypertension. Patient is status post coronary artery bypass grafting. There is aortic atherosclerosis. No evident adenopathy. There is extensive degenerative change in both shoulders, more severe on the left than on the right, stable. IMPRESSION: Pulmonary vascular congestion. Minimal pleural effusions bilaterally. Areas of airspace opacity bilaterally, stable on the left with partial clearing on the right. There is extensive underlying interstitial edema, stable. The present changes may be due entirely to congestive heart failure with partial clearing of alveolar edema on the right. There may also be a degree of superimposed pneumonia, currently more severe on the left than on the right. Both congestive heart failure and pneumonia may be present concurrently. Patient is status post coronary artery bypass grafting. There is aortic atherosclerosis. Aortic Atherosclerosis (ICD10-I70.0). Electronically Signed   By: Bretta Bang III M.D.   On: 07/14/2017 09:33   Vas US Renal Artery Duplex  Result Date: 07/13/2017 ABDOMINAL VISCERAL Other Indication: Hypertension with known advanced chronic kidney disease. High Risk  Factors: Coronary  artery disease.  Limitations: Air/bowel gas.  Examination Guidelines: A complete evaluation includes B-mode imaging, spectral doppler, color doppler, and power doppler as needed of all accessible portions of each vessel. Bilateral testing is considered an integral part of a complete examination. Limited examinations for reoccurring indications may be performed as noted.  Duplex Findings: +--------------------+--------+--------+------+ Mesenteric          PSV cm/sEDV cm/sPlaque +--------------------+--------+--------+------+ Aorta Prox             96                  +--------------------+--------+--------+------+ Celiac Artery Origin  186                  +--------------------+--------+--------+------+ SMA Proximal          301                  +--------------------+--------+--------+------+   +------------------+--------+--------+-------+ Right Renal ArteryPSV cm/sEDV cm/sComment +------------------+--------+--------+-------+ Origin             85.00   23.00          +------------------+--------+--------+-------+ Proximal           56.30   10.10          +------------------+--------+--------+-------+ Mid                64.00   10.10          +------------------+--------+--------+-------+ Distal             53.00   11.80          +------------------+--------+--------+-------+ Hilum              38.00   11.00          +------------------+--------+--------+-------+ +-----------------+--------+--------+-------+ Left Renal ArteryPSV cm/sEDV cm/sComment +-----------------+--------+--------+-------+ Origin            72.00   11.00          +-----------------+--------+--------+-------+ Proximal          67.00   14.40          +-----------------+--------+--------+-------+ MId               71.80   12.60          +-----------------+--------+--------+-------+ Distal            58.00    9.57           +-----------------+--------+--------+-------+ Hilum             31.00    7.00          +-----------------+--------+--------+-------+  RT Kidney Length 11.00  LT Kidney Length 12.10 +------------+--------+--------+----+-----------+-----+----+--+ Right KidneyPSV cm/sEDV cm/s RI Left Kidney PSV EDV RI +------------+--------+--------+----+-----------+-----+----+--+ Upper Pole   27.50    8.60  0.69Upper Pole 28.30       +------------+--------+--------+----+-----------+-----+----+--+ Mid           26.0     0            Mid    [phone]   +------------+--------+--------+----+-----------+-----+----+--+ Lower Pole    21.0     0        Lower Pole [phone]   +------------+--------+--------+----+-----------+-----+----+--+ +------------+----++-----------+----+ Right Kidney    Left Kidney     +------------+----++-----------+----+ Cortex      16/7Cortex     12/4 +------------+----++-----------+----+ FINAL INTERPRETATION: Renal:  Right: No evidence of right renal artery stenosis. Abnormal right        Resistive Index. Diffuse increased echogenicity of the  renal        parenchyma. Left:  No evidence of left renal artery stenosis. Abnormal left        Resisitve Index. Diffuse increased echogenicity of the renal        parenchyma. 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  *See table(s) above for measurements and observations.  Diagnosing physician: Fabienne Bruns  Electronically signed by Fabienne Bruns on 07/13/2017 at 8:00:25 PM. Final        Scheduled Meds: . amiodarone  200 mg Oral Daily  . bisoprolol  5 mg Oral Daily  . cloNIDine  0.1 mg Transdermal Weekly  . cloNIDine  0.1 mg Oral BID  . ezetimibe  5 mg Oral Daily  . feeding supplement (ENSURE ENLIVE)  237 mL Oral BID BM  . furosemide  80 mg Intravenous BID  . guaiFENesin  600 mg Oral BID  . insulin aspart  0-5 Units Subcutaneous QHS  . insulin aspart  0-9 Units Subcutaneous TID WC  .  ipratropium-albuterol  3 mL Nebulization TID  . sodium chloride flush  3 mL Intravenous Q12H   Continuous Infusions: . sodium chloride 10 mL/hr (07/12/17 1730)  . cefTRIAXone (ROCEPHIN)  IV Stopped (07/14/17 1741)  . doxycycline (VIBRAMYCIN) IV 100 mg (07/15/17 1004)     LOS: 4 days     Marcellus Scott, MD, FACP, [facility]. [facility] Pager [phone]  If 7PM-7AM, please contact night-coverage [email or web address] Password [facility] 07/15/2017, 12:37 PM

## 2017-07-15 NOTE — Progress Notes (Signed)
Occupational Therapy Treatment Patient Details Name: [name] MRN: [id number] DOB: [birth year] Today's Date: 07/15/2017    History of present illness [name] is a 77 y.o. male with medical history significant of a.fib on coumadin, CAD, chronic diastolic CHF, status post CABG , CK D stage IV, DM 2,  arterial disease.  Presented with one-week history of nasal congestion intermittent fevers and shortness of breath that has ben progressive, as well as epistaxis. Reports only mild cough. He have been coughing up some blood in AM's but that he feels related to his nose bleeds.  Also with fever.  Pt hypoxic in ED as well.  Pt somehow got finger caught in wheelchair in the ED and underwent I & D of 4th and 5th digits with  5th nail bed repair adn ORIF distal phalanx fx.    OT comments  [name] admitted with above presents to acute OT making slow progress towards appropriate goals due to medical issues since eval. He will continue to benefit from acute OT with follow up at SNF now due to prolonged hospitalization and increased generalized weakness with increased need for O2 support.   Follow Up Recommendations  SNF;Supervision/Assistance - 24 hour    Equipment Recommendations  Other (comment)(TBD at next venue)       Precautions / Restrictions Precautions Precautions: Fall Precaution Comments: watch O2 sats Restrictions Weight Bearing Restrictions: No RUE Weight Bearing: Weight bear through elbow only Other Position/Activity Restrictions: no formal orders but pt has R 5th finger fx s/p ORIF so WB through elbow only       Mobility Bed Mobility Overal bed mobility: Needs Assistance Bed Mobility: Supine to Sit     Supine to sit: HOB elevated;Min guard     General bed mobility comments: A little assist to come to EOB and incr time needed. Once at EOB, pt assisted with washing himself as he had vomited earlier and gown had not been changed.  [name] was also  changed.  Transfers Overall transfer level: Needs assistance Equipment used: Rolling walker (2 wheeled) Transfers: Sit to/from Stand Sit to Stand: Min guard;Min assist         General transfer comment: Needed steadying assist.     Balance Overall balance assessment: Needs assistance Sitting-balance support: No upper extremity supported;Feet supported Sitting balance-Leahy Scale: Good     Standing balance support: No upper extremity supported;During functional activity Standing balance-Leahy Scale: Fair Standing balance comment: can stand statically without difficulty                           ADL either performed or assessed with clinical judgement   ADL Overall ADL's : Needs assistance/impaired     Grooming: Wash/dry face;Oral care                 Lower Body Dressing Details (indicate cue type and reason): Able to reach down and adjust socks while sitting EOB Toilet Transfer: Minimal assistance;Ambulation;RW Toilet Transfer Details (indicate cue type and reason): Bed>15 feet out door                 Vision Patient Visual Report: No change from baseline            Cognition Arousal/Alertness: Awake/alert Behavior During Therapy: WFL for tasks assessed/performed Overall Cognitive Status: Within Functional Limits for tasks assessed  Exercises Exercises: Other exercises;General Lower Extremity;General Upper Extremity General Exercises - Upper Extremity Shoulder Flexion: AROM;Both;5 reps;Seated General Exercises - Lower Extremity Ankle Circles/Pumps: AROM;Both;10 reps;Supine Quad Sets: AROM;Both;10 reps;Supine Heel Slides: AROM;Both;10 reps;Supine Other Exercises Other Exercises: Pt's RUE without edema today, pt can move digits1-3 without issues and can oppose 2 and 3 digits with thumb      General Comments HR 104-113 bpm, BP 177/87 initially and up to 186/77 at end of treatment.       Pertinent Vitals/ Pain       Pain Assessment: Faces Faces Pain Scale: Hurts a little bit Pain Location: LEs Pain Descriptors / Indicators: Discomfort Pain Intervention(s): Limited activity within patient's tolerance;Monitored during session;Repositioned         Frequency  Min 2X/week        Progress Toward Goals  OT Goals(current goals can now be found in the care plan section)  Progress towards OT goals: Progressing toward goals  Acute Rehab OT Goals Patient Stated Goal: to go home  Plan Discharge plan needs to be updated    Co-evaluation    PT/OT/SLP Co-Evaluation/Treatment: Yes Reason for Co-Treatment: Complexity of the patient's impairments (multi-system involvement);To address functional/ADL transfers PT goals addressed during session: Mobility/safety with mobility OT goals addressed during session: ADL's and self-care;Strengthening/ROM      AM-PAC PT "6 Clicks" Daily Activity     Outcome Measure   Help from another person eating meals?: A Little Help from another person taking care of personal grooming?: A Little Help from another person toileting, which includes using toliet, bedpan, or urinal?: A Lot Help from another person bathing (including washing, rinsing, drying)?: A Lot Help from another person to put on and taking off regular upper body clothing?: A Little Help from another person to put on and taking off regular lower body clothing?: A Lot 6 Click Score: 15    End of Session Equipment Utilized During Treatment: Oxygen(8 liters at rest, 10 with activity on HFNC)  OT Visit Diagnosis: Unsteadiness on feet (R26.81);Muscle weakness (generalized) (M62.81);Pain Pain - Right/Left: (both) Pain - part of body: Leg   Activity Tolerance (limited to minimal ADLs and 15 feet ambulation)   Patient Left in chair;with call bell/phone within reach   Nurse Communication Mobility status        Time: 6294-7654 OT Time Calculation (min): 26  min  Charges: OT General Charges $OT Visit: 1 Visit OT Treatments $Self Care/Home Management : 8-22 mins  Ignacia Palma, OTR/L [phone] 07/15/2017

## 2017-07-15 NOTE — Care Management Important Message (Signed)
Important Message  Patient Details  Name: [name] MRN: [id number] Date of Birth: [birth year]   Medicare Important Message Given:  Yes    Senovia Gauer 07/15/2017, 12:06 PM

## 2017-07-15 NOTE — Progress Notes (Deleted)
Patient is alert, oriented, pleasant and hard of hearing.  Oxygen on 6 lpm via HFNC.  Saturations remained in the low to mid 90's this shift.  Left posterior hand PIV flushes well.  No signs or symptoms of infiltration noted.  Bed bath given, full linen change done, external catheter changed along with foley drainage bag.  Safety and comfort measures maintained, call bell within reach.  Patient had a medium brown stool.

## 2017-07-15 NOTE — Progress Notes (Signed)
Physical Therapy Treatment Patient Details Name: [name] MRN: [id number] DOB: 01/12/1941 Today's Date: 07/15/2017    History of Present Illness [name] is a 77 y.o. male with medical history significant of a.fib on coumadin, CAD, chronic diastolic CHF, status post CABG , CK D stage IV, DM 2,  arterial disease.  Presented with one-week history of nasal congestion intermittent fevers and shortness of breath that has ben progressive, as well as epistaxis. Reports only mild cough. He have been coughing up some blood in AM's but that he feels related to his nose bleeds.  Also with fever.  Pt hypoxic in ED as well.  Pt somehow got finger caught in wheelchair in the ED and underwent I & D of 4th and 5th digits with  5th nail bed repair adn ORIF distal phalanx fx.     PT Comments    Pt admitted with above diagnosis. Pt currently with functional limitations due to the deficits listed below (see PT Problem List). Pt was able to get OOB today but desats on 10LHFNC and once settled still needed 8LHFNC to keep sats >90%. REquiring much more assist to ambulate today with RW with max cues as well.  Pt was giving good effort today.  C/o bil LE pain.  Will continue to follow.   Pt will benefit from skilled PT to increase their independence and safety with mobility to allow discharge to the venue listed below.     Follow Up Recommendations  SNF;Supervision/Assistance - 24 hour     Equipment Recommendations  Rolling walker with 5" wheels;3in1 (PT)    Recommendations for Other Services       Precautions / Restrictions Precautions Precautions: Fall Precaution Comments: watch O2 sats Restrictions Weight Bearing Restrictions: No RUE Weight Bearing: Weight bear through elbow only Other Position/Activity Restrictions: no formal orders but pt has R 5th finger fx s/p ORIF so WB through elbow only    Mobility  Bed Mobility Overal bed mobility: Needs Assistance Bed Mobility: Supine to Sit      Supine to sit: HOB elevated;Min guard     General bed mobility comments: A little assist to come to EOB and incr time needed. Once at EOB, pt assisted with washing himself as he had vomited earlier and gown had not been changed.  [employer] was also changed.  Transfers Overall transfer level: Needs assistance Equipment used: Rolling walker (2 wheeled) Transfers: Sit to/from Stand Sit to Stand: Min guard;Min assist         General transfer comment: Needed steadying assist.   Ambulation/Gait Ambulation/Gait assistance: +2 safety/equipment;Mod assist;Min assist Ambulation Distance (Feet): 15 Feet Assistive device: Rolling walker (2 wheeled) Gait Pattern/deviations: Step-through pattern;Decreased stride length;Antalgic;Drifts right/left;Trunk flexed;Wide base of support   Gait velocity interpretation: Below normal speed for age/gender General Gait Details:   Pt needing incr cues to safety as he was not staying close to RW.  Pt needed support as well as he reports his LEs were hurting and weak.  DOE 4/4 with pt showing signs of fatigue after only a short distance with sats on 10L (had to incr from 8L) dropping down to 86% and took 3 min to return to >90%.  Left pt on 8L with sats at 92% on departure.  Pt in chair comfortable.    Stairs            Wheelchair Mobility    Modified Rankin (Stroke Patients Only)       Balance Overall balance assessment: Needs  assistance Sitting-balance support: No upper extremity supported;Feet supported Sitting balance-Leahy Scale: Good     Standing balance support: No upper extremity supported;During functional activity Standing balance-Leahy Scale: Fair Standing balance comment: can stand statically without difficulty                            Cognition Arousal/Alertness: Awake/alert Behavior During Therapy: WFL for tasks assessed/performed Overall Cognitive Status: Within Functional Limits for tasks assessed                                         Exercises General Exercises - Upper Extremity Shoulder Flexion: AROM;Both;5 reps;Seated General Exercises - Lower Extremity Ankle Circles/Pumps: AROM;Both;10 reps;Supine Quad Sets: AROM;Both;10 reps;Supine Heel Slides: AROM;Both;10 reps;Supine    General Comments General comments (skin integrity, edema, etc.): HR 104-113 bpm, BP 177/87 initially and up to 186/77 at end of treatment.        Pertinent Vitals/Pain Pain Assessment: Faces Faces Pain Scale: Hurts a little bit Pain Location: LEs Pain Descriptors / Indicators: Discomfort Pain Intervention(s): Limited activity within patient's tolerance;Monitored during session;Repositioned  See VS above  Home Living                      Prior Function            PT Goals (current goals can now be found in the care plan section) Acute Rehab PT Goals Patient Stated Goal: to go home Progress towards PT goals: Progressing toward goals    Frequency    Min 3X/week      PT Plan Discharge plan needs to be updated    Co-evaluation PT/OT/SLP Co-Evaluation/Treatment: Yes Reason for Co-Treatment: Complexity of the patient's impairments (multi-system involvement);To address functional/ADL transfers PT goals addressed during session: Mobility/safety with mobility        AM-PAC PT "6 Clicks" Daily Activity  Outcome Measure  Difficulty turning over in bed (including adjusting bedclothes, sheets and blankets)?: None Difficulty moving from lying on back to sitting on the side of the bed? : None Difficulty sitting down on and standing up from a chair with arms (e.g., wheelchair, bedside commode, etc,.)?: A Lot Help needed moving to and from a bed to chair (including a wheelchair)?: A Lot Help needed walking in hospital room?: A Lot Help needed climbing 3-5 steps with a railing? : Total 6 Click Score: 15    End of Session Equipment Utilized During Treatment: Gait  belt;Oxygen(8LHFNC) Activity Tolerance: Patient limited by fatigue Patient left: in chair;with call bell/phone within reach;with chair alarm set Nurse Communication: Mobility status PT Visit Diagnosis: Muscle weakness (generalized) (M62.81)     Time: 4098-1191 PT Time Calculation (min) (ACUTE ONLY): 23 min  Charges:  $Gait Training: 8-22 mins                    G Codes:       Joshia Kitchings,PT Acute Rehabilitation [phone] (pager)    Berline Lopes 07/15/2017, 1:19 PM

## 2017-07-16 DIAGNOSIS — I5032 Chronic diastolic (congestive) heart failure: Secondary | ICD-10-CM

## 2017-07-16 DIAGNOSIS — S68129A Partial traumatic metacarpophalangeal amputation of unspecified finger, initial encounter: Secondary | ICD-10-CM

## 2017-07-16 LAB — RENAL FUNCTION PANEL
ALBUMIN: 2 g/dL — AB (ref 3.5–5.0)
Anion gap: 18 — ABNORMAL HIGH (ref 5–15)
BUN: 114 mg/dL — ABNORMAL HIGH (ref 6–20)
CHLORIDE: 102 mmol/L (ref 101–111)
CO2: 15 mmol/L — ABNORMAL LOW (ref 22–32)
CREATININE: 8.8 mg/dL — AB (ref 0.61–1.24)
Calcium: 8.7 mg/dL — ABNORMAL LOW (ref 8.9–10.3)
GFR, EST AFRICAN AMERICAN: 6 mL/min — AB (ref >60)
GFR, EST NON AFRICAN AMERICAN: 5 mL/min — AB (ref >60)
Glucose, Bld: 139 mg/dL — ABNORMAL HIGH (ref 65–99)
PHOSPHORUS: 9 mg/dL — AB (ref 2.5–4.6)
Potassium: 5.4 mmol/L — ABNORMAL HIGH (ref 3.5–5.1)
Sodium: 135 mmol/L (ref 135–145)

## 2017-07-16 LAB — GLUCOSE, CAPILLARY
GLUCOSE-CAPILLARY: 97 mg/dL (ref 65–99)
GLUCOSE-CAPILLARY: 103 mg/dL — AB (ref 65–99)
GLUCOSE-CAPILLARY: 102 mg/dL — AB (ref 65–99)
Glucose-Capillary: 121 mg/dL — ABNORMAL HIGH (ref 65–99)
Glucose-Capillary: 114 mg/dL — ABNORMAL HIGH (ref 65–99)
Glucose-Capillary: 107 mg/dL — ABNORMAL HIGH (ref 65–99)
Glucose-Capillary: 134 mg/dL — ABNORMAL HIGH (ref 65–99)
Glucose-Capillary: 129 mg/dL — ABNORMAL HIGH (ref 65–99)

## 2017-07-16 LAB — PROTIME-INR
INR: 1.91
Prothrombin Time: 21.7 seconds — ABNORMAL HIGH (ref 11.4–15.2)

## 2017-07-16 LAB — CBC
HEMATOCRIT: 24.3 % — AB (ref 39.0–52.0)
Hemoglobin: 8 g/dL — ABNORMAL LOW (ref 13.0–17.0)
MCH: 30.5 pg (ref 26.0–34.0)
MCHC: 32.9 g/dL (ref 30.0–36.0)
MCV: 92.7 fL (ref 78.0–100.0)
PLATELETS: 397 10*3/uL (ref 150–400)
RBC: 2.62 MIL/uL — ABNORMAL LOW (ref 4.22–5.81)
RDW: 14.9 % (ref 11.5–15.5)
WBC: 18.7 10*3/uL — AB (ref 4.0–10.5)

## 2017-07-16 LAB — CULTURE, BLOOD (ROUTINE X 2)
CULTURE: NO GROWTH
SPECIAL REQUESTS: ADEQUATE

## 2017-07-16 MED ORDER — PHENOL 1.4 % MT LIQD
1.0000 | OROMUCOSAL | Status: DC | PRN
  Administered 2017-07-16: 1 via OROMUCOSAL
  Filled 2017-07-16: qty 177

## 2017-07-16 MED ORDER — PATIROMER SORBITEX CALCIUM 8.4 G PO PACK
16.8000 g | PACK | Freq: Every day | ORAL | Status: DC
  Administered 2017-07-16: 16.8 g via ORAL
  Filled 2017-07-16 (×2): qty 4

## 2017-07-16 MED ORDER — SODIUM BICARBONATE 650 MG PO TABS
650.0000 mg | ORAL_TABLET | Freq: Three times a day (TID) | ORAL | Status: DC
  Administered 2017-07-16 (×3): 650 mg via ORAL
  Filled 2017-07-16 (×3): qty 1

## 2017-07-16 MED ORDER — LORAZEPAM 2 MG/ML IJ SOLN
1.0000 mg | Freq: Once | INTRAMUSCULAR | Status: AC
  Administered 2017-07-16: 1 mg via INTRAVENOUS
  Filled 2017-07-16: qty 1

## 2017-07-16 NOTE — Progress Notes (Signed)
Chart reviewed - noted patient considering hospice transition. Creatinine declining and per Dr. Hadley Pen note, he is not interested in RRT. Agree given hemodynamically significant anemia requiring transfusion, not a candidate for anticoagulation for a-fib. INR 1.91 today. Will be available as needed, but sign-off for now. Call with questions.  Chrystie Nose, MD, [facility], FACP  [place]  [facility]  Medical Director of the [facility]  [facility] Diplomate of the American Board of Clinical Lipidology Attending Cardiologist  Direct Dial: [phone]  Fax: [phone]  Website:  [email or web address]

## 2017-07-16 NOTE — Progress Notes (Signed)
PROGRESS NOTE    [name]  XBJ:[id number] DOB: [birth year] DOA: 07-24-2017 PCP: Lupita Raider, MD   Brief Narrative:  The patient is a 77 year old male with PMH of HTN, DM, PAD, CAD status post CABG (Dr. Verdis Prime, Cardiology), stage III CKD (baseline creatinine 1.2-2.5, Dr. Elvis Coil. Nephrology), atrial fibrillation on Coumadin, chronic diastolic CHF, sent to ED from ENT office 07/24/2017 due to dyspnea/hypoxia, anemia and chest x-ray showed multifocal airspace disease concerning for pneumonia versus hemorrhage.  Admitted to stepdown for acute respiratory failure with hypoxia, decompensated CHF, pneumonia versus pulmonary hemorrhage, acute on chronic kidney disease, acute blood loss anemia.  Unfortunately while waiting in the ED, finger got caught in wheelchair causing partial amputation of distal fifth digit and underwent urgent ORIF by hand surgery.  Cardiology and nephrology consulting.  Patient rapidly deteriorating and becoming more uremic. Now not eating.  After discussing with Cardiology and Nephrology, consulted Palliative Care Team to assist with goals and transition to hospice.  Assessment & Plan:   Active Problems:   Essential hypertension   Hyperlipidemia   CKD (chronic kidney disease), stage IV (HCC)   Type 2 diabetes mellitus with vascular disease (HCC)   Atrial fibrillation (HCC)   Chronic diastolic CHF (congestive heart failure) (HCC)   Anemia   CAP (community acquired pneumonia)   Epistaxis   Acute renal failure (HCC)   Hypoxia  Acute Respiratory Failure with Hypoxia:  -Multifactorial related to decompensated CHF, community-acquired pneumonia and? Pulmonary hemorrhage in the context of supratherapeutic INR and considering vasculitis.   -Treated with IV ceftriaxone, doxycycline, IV Lasix initiated.  This seemed to have improved initially but again worsened.   -Had to be on Ventimask again last night.  Deteriorating.  Poor overall prognosis.  - -Discussed in  detail with Dr. Coladonato/Nephrology and Dr. Rennis Golden and agree that patient is most appropriate for palliative care consultation and transitioning to hospice.   -Palliative care consulted and I discussed with Eduard Roux for early visit in AM  Community-acquired pneumonia:  -IV ceftriaxone and doxycycline.   -Flu panel PCR and RSV panel negative.   -Blood cultures x2: Negative to date. 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  -Concerned that diuretics will worsen renal function.   -The patient has clarified to more than one physician that he does not wish to go on dialysis despite being aware of risks of deterioration and even death.  -C/w IV Lasix 80 mg daily given reduced output.   -Strict I's/O's, Daily weights.   Cardiology input appreciated. -Patient is -1.704 Liters  -Poor prognosis.  Acute on stage III chronic kidney disease:  -As per nephrology, accelerated rise in serum creatinine over the last 6 months and more recent HTN.   -Renal artery duplex negative for significant RAS.   -Vasculitic workup negative as per nephrology.   -Patient has adamantly clarified that he does not wish to pursue dialysis even a temporary and I confirmed this again with him today.   -Creatinine trending upward and worsening and now 8.80 ; BUN now 114 -Patient now having significant nausea and is uremic.  -Nephrology following.   -C/w Patiromer and Sodium Bicarb -Palliative care consultation and after my discussion with them they will see patient first thin in AM.  Paroxysmal Atrial Fibrillation:  -Continue amiodarone.   -Held Coumadin due to supratherapeutic INR and bleeding risks.   -Cardiology following.   -Was  in sinus rhythm until evening of 2/14 and is reverted to A. fib.   -Not starting IV heparin due to increasing risk of bleeding and overall poor prognosis and considering  transitioning to hospice after Palliative Discussion .  Supratherapeutic INR:  -INR had increased from 3.16 on admission to 5.38 on 2/13.   -Status post vitamin K 5 mg PO on 2/13  -INR now down to 1.91  Accelerated hypertension -Difficult to control blood pressure.   -Multiple drug allergies.   -Continue clonidine.   -Negative renal artery duplex 2/13.    CAD status post CABG/troponin elevation:  -Possibly due to demand ischemia from acute illness and acute on chronic kidney disease.   -No chest pain or EKG changes.  -Not candidate for angiography due to recent severe bleeding and acute renal failure.  -Recent 2D echo 2/13: LVEF 45-50%, worse than 2017, grade 2 diastolic dysfunction. -Cardiology Followed and appreciate further recommendations   Epistaxis:  -Evaluated in office by ENT without clear etiology.   Coagulopathy.   -Reversed with vitamin K and monitor closely.   -No further epistaxis.  Acute Blood Loss Anemia:  -Likely from epistaxis but nephrology concerned regarding acute GN.   -Status post 2 units PRBC.   -Hb/Hct went from 7.5/22.5 -> 8.1/24.6 -> 8.0/24.3 -Continue to Monitor for S/Sx of Bleeding -Repeat CBC in AM   Traumatic right ring finger laceration without tendon involvement & right small finger open distal phalanx fracture -Status post open debridement of right small finger and ORIF 07-31-17 by Dr. Orlan Leavens -Hand Surgery recommended follow-up in office in 7-10 days for wound check, suture removal, therapy evaluation. -If Patient goes Home with Hospice will not have Follow up  Leukocytosis -WBC went from 19.1 -> 20.6 -> 18.7 -Continue to Monitor S/Sx of Infection. On Abx -Repeat CBC in AM   Hyperkalemia -In the Setting of Renal Failure -C/w Patiomer 16.8 g po Daily -Repeat CMP in AM   DVT prophylaxis: SCDs Code Status: FULL CODE Family Communication: No family present at bedside  Disposition Plan: Anticipate likely transition to Home with  Hospice  Consultants:   Cardiology  Nephrology  Hand Surgery  Palliative Care Medicine    Procedures: Surigcal Repair ORIF 2017/07/31 ECHOCARDIOGRAM ------------------------------------------------------------------- Study Conclusions  - Left ventricle: The cavity size was mildly dilated. There was   mild concentric hypertrophy. Systolic function was mildly   reduced. The estimated ejection fraction was in the range of 45%   to 50%. Hypokinesis of the basal-midinferolateral and inferior   myocardium. Features are consistent with a pseudonormal left   ventricular filling pattern, with concomitant abnormal relaxation   and increased filling pressure (grade 2 diastolic dysfunction). - Ventricular septum: Septal motion showed paradox. These changes   are consistent with a post-thoracotomy state. - Mitral valve: There was mild to moderate regurgitation. - Left atrium: The atrium was mildly dilated. - Right ventricle: Systolic function was moderately reduced. - Right atrium: The atrium was mildly dilated. - Tricuspid valve: There was moderate regurgitation directed   centrally. - Pulmonary arteries: Systolic pressure was moderately increased.   PA peak pressure: 64 mm Hg (S).  Impressions:  - Re-evaluation of the 2017 echo shows that there was subtle   inferior and infeolateral hypokinesis at that time. There is   however definite interval LV dilation and worsening of overall LV   systolic function.   Antimicrobials:  Anti-infectives (From admission, onward)   Start     Dose/Rate Route Frequency Ordered Stop   07/12/17  1800  cefTRIAXone (ROCEPHIN) 1 g in sodium chloride 0.9 % 100 mL IVPB     1 g 200 mL/hr over 30 Minutes Intravenous Every 24 hours 07/12/17 0056 07/19/17 1759   07/12/17 0200  azithromycin (ZITHROMAX) 500 mg in sodium chloride 0.9 % 250 mL IVPB  Status:  Discontinued     500 mg 250 mL/hr over 60 Minutes Intravenous Every 24 hours 07/12/17 0056 07/12/17  0900   07/12/17 0045  vancomycin (VANCOCIN) 1,500 mg in sodium chloride 0.9 % 500 mL IVPB  Status:  Discontinued     1,500 mg 250 mL/hr over 120 Minutes Intravenous  Once 07/12/17 0039 07/12/17 0857   07/30/2017 2100  doxycycline (VIBRAMYCIN) 100 mg in sodium chloride 0.9 % 250 mL IVPB     100 mg 125 mL/hr over 120 Minutes Intravenous Every 12 hours 07/30/2017 2020     30-Jul-2017 1815  cefTRIAXone (ROCEPHIN) 1 g in sodium chloride 0.9 % 100 mL IVPB     1 g 200 mL/hr over 30 Minutes Intravenous  Once 07/30/2017 1808 07-30-17 2002   2017/07/30 1815  azithromycin (ZITHROMAX) 500 mg in sodium chloride 0.9 % 250 mL IVPB  Status:  Discontinued     500 mg 250 mL/hr over 60 Minutes Intravenous  Once 07-30-2017 1809 2017/07/30 2019     Subjective: Seen and examined and was not feeling well and was nauseous. No CP but did not want to eat. Did not want dialysis. Wanting to talk with Palliative Care.   Objective: Vitals:   07/16/17 0808 07/16/17 0826 07/16/17 1453 07/16/17 1926  BP:  (!) 146/89  (!) 147/75  Pulse:  85  80  Resp:  (!) 26  19  Temp:  98.2 F (36.8 C)  98.1 F (36.7 C)  TempSrc:  Oral  Oral  SpO2: 99% 99% 99% 92%  Weight:      Height:        Intake/Output Summary (Last 24 hours) at 07/16/2017 1933 Last data filed at 07/16/2017 0300 Gross per 24 hour  Intake 120 ml  Output 175 ml  Net -55 ml   Filed Weights   07/15/17 0114 07/16/17 0000 07/16/17 0348  Weight: 76.8 kg (169 lb 5 oz) 76.3 kg (168 lb 3.4 oz) 76.3 kg (168 lb 3.4 oz)   Examination: Physical Exam:  Constitutional: Elderly ill appearing Caucasian  NAD and appears calm and comfortable Eyes: Lids and conjunctivae normal, sclerae anicteric  ENMT: External Ears, Nose appear normal. Grossly normal hearing. Mucous membranes are moist.  Neck: Appears normal, supple, no cervical masses, normal ROM, no appreciable thyromegaly, no JVD Respiratory: Diminished to auscultation bilaterally, no wheezing, rales, rhonchi or crackles.  Wearing supplemental O2 Cardiovascular: Irregularly irregular, 2/6 Murmur. S1 and S2 auscultated. 1-2+ LE extremity edema. Abdomen: Soft, non-tender, ND. No masses palpated. No appreciable hepatosplenomegaly. Bowel sounds positive x4.  GU: Deferred. Musculoskeletal: No clubbing / cyanosis of digits/nails. No joint deformity upper and lower extremities.  Skin: No rashes, lesions, ulcers on a limited skin eval. No induration; Warm and dry.  Neurologic: CN 2-12 grossly intact with no focal deficits. Romberg sign and cerebellar reflexes not assessed.  Psychiatric: Normal judgment and insight. Alert and oriented x 3. Normal mood and appropriate affect.   Data Reviewed: I have personally reviewed following labs and imaging studies  CBC: Recent Labs  Lab 07/13/17 0417 07/13/17 1129 07/13/17 1535 07/14/17 0449 07/15/17 0638 07/16/17 0440  WBC 20.9*  --  18.1* 19.1* 20.6* 18.7*  HGB 6.4* 7.5*  7.6* 7.5* 8.1* 8.0*  HCT 19.3* 22.4* 23.0* 22.5* 24.6* 24.3*  MCV 93.7  --  92.0 92.2 92.8 92.7  PLT 342  --  324 348 393 397   Basic Metabolic Panel: Recent Labs  Lab 07/12/17 0338 07/13/17 0417 07/14/17 0449 07/15/17 0638 07/16/17 0440  NA 140 137 134* 135 135  K 4.7 4.8 4.9 4.8 5.4*  CL 111 108 105 104 102  CO2 15* 15* 14* 13* 15*  GLUCOSE 154* 125* 131* 132* 139*  BUN 72* 78* 84* 98* 114*  CREATININE 7.32* 7.52* 7.79* 8.49* 8.80*  CALCIUM 7.8* 8.1* 8.3* 8.6* 8.7*  MG 1.9  --   --   --   --   PHOS 5.6* 5.8* 6.9* 7.9* 9.0*   GFR: Estimated Creatinine Clearance: 6.8 mL/min (A) (by C-G formula based on SCr of 8.8 mg/dL (H)). Liver Function Tests: Recent Labs  Lab 07/12/17 0338 07/13/17 0417 07/13/17 1129 07/14/17 0449 07/15/17 0638 07/16/17 0440  AST 28  --  35  --   --   --   ALT 19  --  21  --   --   --   ALKPHOS 83  --  95  --   --   --   BILITOT 1.1  --  2.0*  --   --   --   PROT 5.4*  --  5.6*  --   --   --   ALBUMIN 2.1* 2.0* 2.1* 2.0* 2.1* 2.0*   No results for  input(s): LIPASE, AMYLASE in the last 168 hours. No results for input(s): AMMONIA in the last 168 hours. Coagulation Profile: Recent Labs  Lab 07/12/17 0338 07/13/17 1129 07/14/17 0449 07/15/17 0638 07/16/17 0440  INR 3.73 5.38* 3.38 2.02 1.91   Cardiac Enzymes: Recent Labs  Lab 07/13/17 2242 07/12/17 0338 07/12/17 0937  TROPONINI 0.11* 0.13* 0.13*   BNP (last 3 results) Recent Labs    05/18/17 1530  PROBNP 53,271*   HbA1C: No results for input(s): HGBA1C in the last 72 hours. CBG: Recent Labs  Lab 07/15/17 1606 07/15/17 1932 07/16/17 0734 07/16/17 1115 07/16/17 1613  GLUCAP 140* 97 134* 103* 102*   Lipid Profile: No results for input(s): CHOL, HDL, LDLCALC, TRIG, CHOLHDL, LDLDIRECT in the last 72 hours. Thyroid Function Tests: No results for input(s): TSH, T4TOTAL, FREET4, T3FREE, THYROIDAB in the last 72 hours. Anemia Panel: No results for input(s): VITAMINB12, FOLATE, FERRITIN, TIBC, IRON, RETICCTPCT in the last 72 hours. Sepsis Labs: Recent Labs  Lab 07-13-17 1748  LATICACIDVEN 1.03    Recent Results (from the past 240 hour(s))  Blood Culture (routine x 2)     Status: None   Collection Time: 07/13/17  5:27 PM  Result Value Ref Range Status   Specimen Description BLOOD SITE NOT SPECIFIED  Final   Special Requests   Final    BOTTLES DRAWN AEROBIC AND ANAEROBIC Blood Culture adequate volume   Culture   Final    NO GROWTH 5 DAYS Performed at [facility], [address], [place], [place] [address]    Report Status 07/16/2017 FINAL  Final  Respiratory Panel by PCR     Status: None   Collection Time: July 13, 2017  9:16 PM  Result Value Ref Range Status   Adenovirus NOT DETECTED NOT DETECTED Final   Coronavirus 229E NOT DETECTED NOT DETECTED Final   Coronavirus HKU1 NOT DETECTED NOT DETECTED Final   Coronavirus NL63 NOT DETECTED NOT DETECTED Final   Coronavirus OC43  NOT DETECTED NOT DETECTED Final   Metapneumovirus NOT DETECTED NOT DETECTED Final    Rhinovirus / Enterovirus NOT DETECTED NOT DETECTED Final   Influenza A NOT DETECTED NOT DETECTED Final   Influenza A H1 NOT DETECTED NOT DETECTED Final   Influenza A H1 2009 NOT DETECTED NOT DETECTED Final   Influenza A H3 NOT DETECTED NOT DETECTED Final   Influenza B NOT DETECTED NOT DETECTED Final   Parainfluenza Virus 1 NOT DETECTED NOT DETECTED Final   Parainfluenza Virus 2 NOT DETECTED NOT DETECTED Final   Parainfluenza Virus 3 NOT DETECTED NOT DETECTED Final   Parainfluenza Virus 4 NOT DETECTED NOT DETECTED Final   Respiratory Syncytial Virus NOT DETECTED NOT DETECTED Final   Bordetella pertussis NOT DETECTED NOT DETECTED Final   Chlamydophila pneumoniae NOT DETECTED NOT DETECTED Final   Mycoplasma pneumoniae NOT DETECTED NOT DETECTED Final    Comment: Performed at [facility], [address], [place], [place] [address]  Urine Culture     Status: None   Collection Time: 07/16/2017 10:42 PM  Result Value Ref Range Status   Specimen Description URINE, CLEAN CATCH  Final   Special Requests Normal  Final   Culture   Final    NO GROWTH Performed at [facility], [address] [place], [place] [address]    Report Status 07/13/2017 FINAL  Final  MRSA PCR Screening     Status: None   Collection Time: 07/12/17  1:00 AM  Result Value Ref Range Status   MRSA by PCR NEGATIVE NEGATIVE Final    Comment:        The GeneXpert MRSA Assay (FDA approved for NASAL specimens only), is one component of a comprehensive MRSA colonization surveillance program. It is not intended to diagnose MRSA infection nor to guide or monitor treatment for MRSA infections. Performed at [facility], [address] [place], [place] [address]   Culture, blood (Routine X 2) w Reflex to ID Panel     Status: None (Preliminary result)   Collection Time: 07/12/17  4:13 AM  Result Value Ref Range Status   Specimen Description BLOOD RIGHT ARM  Final   Special Requests IN PEDIATRIC BOTTLE  Blood Culture adequate volume  Final   Culture   Final    NO GROWTH 4 DAYS Performed at [facility], [address], [place], [place] [address]    Report Status PENDING  Incomplete    Radiology Studies: No results found.  Scheduled Meds: . amiodarone  200 mg Oral Daily  . bisoprolol  5 mg Oral Daily  . cloNIDine  0.1 mg Transdermal Weekly  . cloNIDine  0.1 mg Oral BID  . ezetimibe  5 mg Oral Daily  . feeding supplement (ENSURE ENLIVE)  237 mL Oral BID BM  . furosemide  80 mg Intravenous Daily  . guaiFENesin  600 mg Oral BID  . insulin aspart  0-5 Units Subcutaneous QHS  . insulin aspart  0-9 Units Subcutaneous TID WC  . ipratropium-albuterol  3 mL Nebulization TID  . patiromer  16.8 g Oral Daily  . sodium bicarbonate  650 mg Oral TID  . sodium chloride flush  3 mL Intravenous Q12H   Continuous Infusions: . sodium chloride 10 mL/hr (07/12/17 1730)  . cefTRIAXone (ROCEPHIN)  IV Stopped (07/16/17 1833)  . doxycycline (VIBRAMYCIN) IV Stopped (07/16/17 1022)    LOS: 5 days   Merlene Laughter, DO [facility] Pager [phone]  If  7PM-7AM, please contact night-coverage [email or web address] Password [facility] 07/16/2017, 7:33 PM

## 2017-07-16 NOTE — Progress Notes (Signed)
This evening upon assessing this patient.  A change in mental status was noted, patient was more agitated, restless and not redirectable.   Wife is present by his side and noticed the same as listed above.  [facility] notified and orders were placed.  Will continue to monitor patient's oxygen status, and mental status.  Safety and comfort will be monitored.  Bed alarm on.  Call bell within reach.

## 2017-07-16 NOTE — Progress Notes (Signed)
S: Not feeling well today O:BP (!) 146/89   Pulse 85   Temp 98.2 F (36.8 C) (Oral)   Resp (!) 26   Ht 5\' 8"  (1.727 m)   Wt 76.3 kg (168 lb 3.4 oz)   SpO2 99%   BMI 25.58 kg/m   Intake/Output Summary (Last 24 hours) at 07/16/2017 1147 Last data filed at 07/16/2017 0300 Gross per 24 hour  Intake 350 ml  Output 1325 ml  Net -975 ml   Intake/Output: I/O last 3 completed shifts: In: 970 [P.O.:420; IV Piggyback:550] Out: 2675 [Urine:2675]  Intake/Output this shift:  No intake/output data recorded. Weight change: -1.7 kg (-12 oz) Gen: NAd CVS:no rub Resp: decreased BS at bases Abd: +BS, soft, NT Ext: 2+ edema  Recent Labs  Lab 07/02/2017 1640 07/12/17 0338 07/13/17 0417 07/13/17 1129 07/14/17 0449 07/15/17 0638 07/16/17 0440  NA 136 140 137  --  134* 135 135  K 4.4 4.7 4.8  --  4.9 4.8 5.4*  CL 107 111 108  --  105 104 102  CO2 16* 15* 15*  --  14* 13* 15*  GLUCOSE 182* 154* 125*  --  131* 132* 139*  BUN 70* 72* 78*  --  84* 98* 114*  CREATININE 7.28* 7.32* 7.52*  --  7.79* 8.49* 8.80*  ALBUMIN  --  2.1* 2.0* 2.1* 2.0* 2.1* 2.0*  CALCIUM 8.2* 7.8* 8.1*  --  8.3* 8.6* 8.7*  PHOS  --  5.6* 5.8*  --  6.9* 7.9* 9.0*  AST  --  28  --  35  --   --   --   ALT  --  19  --  21  --   --   --    Liver Function Tests: Recent Labs  Lab 07/12/17 0338  07/13/17 1129 07/14/17 0449 07/15/17 0638 07/16/17 0440  AST 28  --  35  --   --   --   ALT 19  --  21  --   --   --   ALKPHOS 83  --  95  --   --   --   BILITOT 1.1  --  2.0*  --   --   --   PROT 5.4*  --  5.6*  --   --   --   ALBUMIN 2.1*   < > 2.1* 2.0* 2.1* 2.0*   < > = values in this interval not displayed.   No results for input(s): LIPASE, AMYLASE in the last 168 hours. No results for input(s): AMMONIA in the last 168 hours. CBC: Recent Labs  Lab 07/13/17 0417  07/13/17 1535 07/14/17 0449 07/15/17 0638 07/16/17 0440  WBC 20.9*  --  18.1* 19.1* 20.6* 18.7*  HGB 6.4*   < > 7.6* 7.5* 8.1* 8.0*  HCT 19.3*   <  > 23.0* 22.5* 24.6* 24.3*  MCV 93.7  --  92.0 92.2 92.8 92.7  PLT 342  --  324 348 393 397   < > = values in this interval not displayed.   Cardiac Enzymes: Recent Labs  Lab 07/22/2017 2242 07/12/17 0338 07/12/17 0937  TROPONINI 0.11* 0.13* 0.13*   CBG: Recent Labs  Lab 07/15/17 0812 07/15/17 0951 07/15/17 1606 07/16/17 0734 07/16/17 1115  GLUCAP 125* 126* 140* 134* 103*    Iron Studies: No results for input(s): IRON, TIBC, TRANSFERRIN, FERRITIN in the last 72 hours. Studies/Results: No results found. Marland Kitchen amiodarone  200 mg Oral Daily  . bisoprolol  5  mg Oral Daily  . cloNIDine  0.1 mg Transdermal Weekly  . cloNIDine  0.1 mg Oral BID  . ezetimibe  5 mg Oral Daily  . feeding supplement (ENSURE ENLIVE)  237 mL Oral BID BM  . furosemide  80 mg Intravenous Daily  . guaiFENesin  600 mg Oral BID  . insulin aspart  0-5 Units Subcutaneous QHS  . insulin aspart  0-9 Units Subcutaneous TID WC  . ipratropium-albuterol  3 mL Nebulization TID  . sodium chloride flush  3 mL Intravenous Q12H    BMET    Component Value Date/Time   NA 135 07/16/2017 0440   NA 136 05/25/2017 1028   K 5.4 (H) 07/16/2017 0440   CL 102 07/16/2017 0440   CO2 15 (L) 07/16/2017 0440   GLUCOSE 139 (H) 07/16/2017 0440   BUN 114 (H) 07/16/2017 0440   BUN 42 (H) 05/25/2017 1028   CREATININE 8.80 (H) 07/16/2017 0440   CREATININE 2.64 (H) 11/01/2016 0838   CALCIUM 8.7 (L) 07/16/2017 0440   GFRNONAA 5 (L) 07/16/2017 0440   GFRNONAA 23 (L) 11/01/2016 0838   GFRAA 6 (L) 07/16/2017 0440   GFRAA 26 (L) 11/01/2016 0838   CBC    Component Value Date/Time   WBC 18.7 (H) 07/16/2017 0440   RBC 2.62 (L) 07/16/2017 0440   HGB 8.0 (L) 07/16/2017 0440   HGB 13.4 03/17/2010 1538   HCT 24.3 (L) 07/16/2017 0440   HCT 39.3 03/17/2010 1538   PLT 397 07/16/2017 0440   PLT 205 03/17/2010 1538   MCV 92.7 07/16/2017 0440   MCV 92.2 03/17/2010 1538   MCH 30.5 07/16/2017 0440   MCHC 32.9 07/16/2017 0440   RDW 14.9  07/16/2017 0440   RDW 12.6 03/17/2010 1538   LYMPHSABS 1,066 11/01/2016 0838   LYMPHSABS 1.3 03/17/2010 1538   MONOABS 1,066 (H) 11/01/2016 0838   MONOABS 1.0 (H) 03/17/2010 1538   EOSABS 410 11/01/2016 0838   EOSABS 0.9 (H) 03/17/2010 1538   BASOSABS 0 11/01/2016 0838   BASOSABS 0.0 03/17/2010 1538    Assessment/Plan: 1. Progressive CKD- accelerated rise in Scr over the last 6 months and more recent accelerated HTN. DDx RAS (has history of ASCVD), atheroembolic disease (although denies any embolic changes to his toes following lower ext angiogram), acute GN/RPGN. Will order renal artery duplex and some serologies. His primary Nephrologist, Dr. Hyman Hopes was kind enough to speak with Mr and Mrs. Casimiro Needle regarding his worsening renal function and Mr. Sawatzky was clear that he was not interested in dialysis and understood the consequences but is willing to have other studies to be done to see if this is reversible. He is currently without uremic symptoms. 1. All serologies and complement levels were normal, no evidence of acute GN 2. Remains firm in decision not to pursue RRT even temporarily. 3. Renal artery duplex negative for significant RAS 4. Continue with conservative treatment and awaiting consult from hospice services. 2. Metabolic acidosis- due to #1 will add bicarb 3. Hyperkalemia- due to #1 will start valtessa and bicarb  4. Epistaxis- possibly related to accelerated HTN. ENT consulted 5. Accelerated HTN- as above. He has multiple drug intolerances. Continue with his outpatient regimen and agree with starting catapress patch.Negativerenal artery duplex 07/13/17. 6. ABLA- likely due to epistaxis although will r/o acute GN 7. Acute on chronic diastolic CHF-continuelasixbut decrease frequency and follow.  8. CAP- on rocephin and azithromycin 9. A fib on coumadin- currently on hold due to epistaxis and ABLA 10.  Elevated troponin- Cardiology has been consulted.  11. 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  12. Disposition- awaiting consult from hospice services given his worsening renal function and the development of uremic symptoms.    Irena Cords, MD [employer] [phone]

## 2017-07-17 ENCOUNTER — Inpatient Hospital Stay (HOSPITAL_COMMUNITY): Payer: Medicare Other

## 2017-07-17 DIAGNOSIS — R0602 Shortness of breath: Secondary | ICD-10-CM

## 2017-07-17 DIAGNOSIS — Z7189 Other specified counseling: Secondary | ICD-10-CM

## 2017-07-17 DIAGNOSIS — Z515 Encounter for palliative care: Secondary | ICD-10-CM

## 2017-07-17 DIAGNOSIS — S68129A Partial traumatic metacarpophalangeal amputation of unspecified finger, initial encounter: Secondary | ICD-10-CM

## 2017-07-17 LAB — RENAL FUNCTION PANEL
ALBUMIN: 2 g/dL — AB (ref 3.5–5.0)
Anion gap: 19 — ABNORMAL HIGH (ref 5–15)
BUN: 131 mg/dL — AB (ref 6–20)
CHLORIDE: 103 mmol/L (ref 101–111)
CO2: 13 mmol/L — AB (ref 22–32)
Calcium: 8.3 mg/dL — ABNORMAL LOW (ref 8.9–10.3)
Creatinine, Ser: 9.6 mg/dL — ABNORMAL HIGH (ref 0.61–1.24)
GFR calc Af Amer: 5 mL/min — ABNORMAL LOW (ref >60)
GFR calc non Af Amer: 5 mL/min — ABNORMAL LOW (ref >60)
GLUCOSE: 152 mg/dL — AB (ref 65–99)
POTASSIUM: 5.1 mmol/L (ref 3.5–5.1)
Phosphorus: 10.9 mg/dL — ABNORMAL HIGH (ref 2.5–4.6)
Sodium: 135 mmol/L (ref 135–145)

## 2017-07-17 LAB — CBC WITH DIFFERENTIAL/PLATELET
Basophils Absolute: 0.1 10*3/uL (ref 0.0–0.1)
Basophils Relative: 0 %
Eosinophils Absolute: 0 10*3/uL (ref 0.0–0.7)
Eosinophils Relative: 0 %
HEMATOCRIT: 24.7 % — AB (ref 39.0–52.0)
HEMOGLOBIN: 8 g/dL — AB (ref 13.0–17.0)
LYMPHS ABS: 0.7 10*3/uL (ref 0.7–4.0)
LYMPHS PCT: 4 %
MCH: 30.1 pg (ref 26.0–34.0)
MCHC: 32.4 g/dL (ref 30.0–36.0)
MCV: 92.9 fL (ref 78.0–100.0)
MONOS PCT: 3 %
Monocytes Absolute: 0.5 10*3/uL (ref 0.1–1.0)
NEUTROS ABS: 17.9 10*3/uL — AB (ref 1.7–7.7)
NEUTROS PCT: 93 %
Platelets: 358 10*3/uL (ref 150–400)
RBC: 2.66 MIL/uL — AB (ref 4.22–5.81)
RDW: 14.8 % (ref 11.5–15.5)
WBC: 19.1 10*3/uL — ABNORMAL HIGH (ref 4.0–10.5)

## 2017-07-17 LAB — CULTURE, BLOOD (ROUTINE X 2)
Culture: NO GROWTH
Special Requests: ADEQUATE

## 2017-07-17 LAB — MAGNESIUM: Magnesium: 2.1 mg/dL (ref 1.7–2.4)

## 2017-07-17 LAB — COMPREHENSIVE METABOLIC PANEL
ALT: 325 U/L — AB (ref 17–63)
ANION GAP: 18 — AB (ref 5–15)
AST: 561 U/L — ABNORMAL HIGH (ref 15–41)
Albumin: 2 g/dL — ABNORMAL LOW (ref 3.5–5.0)
Alkaline Phosphatase: 124 U/L (ref 38–126)
BILIRUBIN TOTAL: 2.5 mg/dL — AB (ref 0.3–1.2)
BUN: 132 mg/dL — ABNORMAL HIGH (ref 6–20)
CO2: 14 mmol/L — ABNORMAL LOW (ref 22–32)
CREATININE: 9.59 mg/dL — AB (ref 0.61–1.24)
Calcium: 8.3 mg/dL — ABNORMAL LOW (ref 8.9–10.3)
Chloride: 103 mmol/L (ref 101–111)
GFR calc non Af Amer: 5 mL/min — ABNORMAL LOW (ref >60)
GFR, EST AFRICAN AMERICAN: 5 mL/min — AB (ref >60)
Glucose, Bld: 155 mg/dL — ABNORMAL HIGH (ref 65–99)
Potassium: 5 mmol/L (ref 3.5–5.1)
Sodium: 135 mmol/L (ref 135–145)
TOTAL PROTEIN: 5.5 g/dL — AB (ref 6.5–8.1)

## 2017-07-17 LAB — PROTIME-INR
INR: 2.44
Prothrombin Time: 26.3 seconds — ABNORMAL HIGH (ref 11.4–15.2)

## 2017-07-17 LAB — PHOSPHORUS: PHOSPHORUS: 11 mg/dL — AB (ref 2.5–4.6)

## 2017-07-17 MED ORDER — HYDROMORPHONE HCL 1 MG/ML IJ SOLN
0.2500 mg | INTRAMUSCULAR | Status: DC | PRN
Start: 2017-07-17 — End: 2017-07-17

## 2017-07-17 MED ORDER — GLYCOPYRROLATE 0.2 MG/ML IJ SOLN
0.2000 mg | INTRAMUSCULAR | Status: DC | PRN
  Filled 2017-07-17: qty 1

## 2017-07-17 MED ORDER — HALOPERIDOL LACTATE 5 MG/ML IJ SOLN
2.5000 mg | Freq: Once | INTRAMUSCULAR | Status: AC
  Administered 2017-07-17: 2.5 mg via INTRAVENOUS
  Filled 2017-07-17: qty 1

## 2017-07-17 MED ORDER — HALOPERIDOL LACTATE 5 MG/ML IJ SOLN
5.0000 mg | Freq: Once | INTRAMUSCULAR | Status: AC
  Administered 2017-07-17: 5 mg via INTRAVENOUS
  Filled 2017-07-17: qty 1

## 2017-07-17 MED ORDER — MORPHINE SULFATE (PF) 4 MG/ML IV SOLN
2.0000 mg | Freq: Once | INTRAVENOUS | Status: DC
  Filled 2017-07-17: qty 1

## 2017-07-17 MED ORDER — GLYCOPYRROLATE 0.2 MG/ML IJ SOLN: 0.2000 mg | Freq: Three times a day (TID) | INTRAMUSCULAR | Status: DC

## 2017-07-17 MED ORDER — HALOPERIDOL LACTATE 5 MG/ML IJ SOLN: 1.0000 mg | Freq: Three times a day (TID) | INTRAMUSCULAR | Status: DC

## 2017-07-17 MED ORDER — HALOPERIDOL LACTATE 5 MG/ML IJ SOLN
2.0000 mg | Freq: Four times a day (QID) | INTRAMUSCULAR | Status: DC | PRN
  Filled 2017-07-17: qty 1

## 2017-07-18 ENCOUNTER — Ambulatory Visit (HOSPITAL_COMMUNITY): Payer: Medicare Other

## 2017-07-18 ENCOUNTER — Ambulatory Visit (HOSPITAL_COMMUNITY)
Admission: RE | Admit: 2017-07-18 | Payer: Medicare Other | Source: Ambulatory Visit | Attending: Cardiovascular Disease | Admitting: Cardiovascular Disease

## 2017-07-29 NOTE — Progress Notes (Signed)
Patient remains agitated.  Continuously attempting to removed nonrebreather and successfully doing so.  [facility] notified, haldol 5 mg iv order x 1.  Will monitor effectiveness and patients safety and comfort. Bed alarm remains on with call bell within reach.

## 2017-07-29 NOTE — Progress Notes (Signed)
Wife arrived and is at husband's side.  Blood pressure 99/41.  Resting peacefully at this time on left side.  Will continue to monitor.

## 2017-07-29 NOTE — Progress Notes (Signed)
Obvious deterioration in patient's condition noted.  Respiratory called to be made aware of change and [facility] also made aware.

## 2017-07-29 NOTE — Plan of Care (Signed)
Patient appears to be actively dying.  Family (wife and daughter) educated on signs and symptoms of imminent death.  Family was told to also read the booklet Gone From My Sight which they bought on their [place] app on their phone.

## 2017-07-29 NOTE — Progress Notes (Signed)
Patient continues to attempts to remove oxygen delivery system.  Bilateral mitts applied. Patient placed on 15 lpm oxygen via nonrebreather.  Will monitor oxygen saturations.

## 2017-07-29 NOTE — Progress Notes (Signed)
Patient O2 sats dropped to 76 now at 87. Paged Dr Marland Mcalpine to D/C CBG's and Morphine to ease work or breathing. Patient and family has palliative consult this morning and is a DNR.

## 2017-07-29 NOTE — Consult Note (Signed)
Consultation Note Date: 07-25-2017   Patient Name: [name]  DOB: [birth year]  MRN: [id number]  Age / Sex: 77 y.o., male  PCP: Mayra Neer, MD Referring Physician: Kerney Elbe, DO  Reason for Consultation: Establishing goals of care, Non pain symptom management, Pain control, Psychosocial/spiritual support and Terminal Care  HPI/Patient Profile: 77 y.o. male  with past medical history of atrial fib on anticoagulation, coronary artery disease status post CABG, diastolic heart failure, chronic kidney disease stage IV, diabetes type 2, admitted on 07/22/2017 with shortness of breath, increased INR.  Patient reports that he has had episodes of epistaxis as well as coughing up blood  Consult ordered for goals of care.   Clinical Assessment and Goals of Care: Met with patient, patient's wife, son, daughter.  Patient has had a sudden clinical decline overnight and appears to be trending towards end of life, actively dying.  He has increased work of breathing at rest and is wearing 100% nonrebreather.  No nonverbal signs and symptoms of pain such as grimacing.  Family is tearful but excepting that patient is nearing end of life.  They are having a daughter flying in from [place] who they hope he will be able to stay alive long enough to see her around 5 PM today  She is healthcare proxy is his wife, Ollen Rao.  They are making decisions together as a family    SUMMARY OF RECOMMENDATIONS   Confirmed DNR/DNI Do not advance O2 requirements to BiPAP; may continue with nonrebreather as long as he can tolerate this We will leave comfort med orders in place Patient is critically ill, unlikely to be able to be transported to residential hospice.  Anticipate hospital death within hours Continue antibiotics for now as wife is concerned that discontinuation of these medications may impact the patient's  prognosis; family is hopeful that patient can survive long enough to see his daughter who is expected to arrive this afternoon Anticipate full comfort care after this is been achieved Code Status/Advance Care Planning:  DNR    Symptom Management:   Pain: Since patient has a creatinine of 9.59, recommend using Dilaudid versus morphine; will start Dilaudid IV 0.25-0.5 every 2 hours as needed for pain  Dyspnea: Continue with targeted pulmonary treatments such as oxygen nebulizer treatments; patient is currently wearing 100% nonrebreather and tolerating this.  May continue with this for as long as he can tolerate this with the goal to transition to a nasal cannula once he has opioids on board  Secretions: Patient is volume overloaded.  We will start scheduled Robinul  Agitation: Patient has had an adverse reaction to benzodiazepines; will start scheduled Haldol for end-of-life delirium  Palliative Prophylaxis:   Aspiration, Delirium Protocol, Eye Care, Frequent Pain Assessment, Oral Care and Turn Reposition  Additional Recommendations (Limitations, Scope, Preferences):  Avoid Hospitalization, No Artificial Feeding, No Blood Transfusions, No Chemotherapy, No Diagnostics, No Glucose Monitoring, No Hemodialysis, No Lab Draws, No Radiation, No Surgical Procedures and No Tracheostomy  Psycho-social/Spiritual:   Desire for  further Chaplaincy support:no  Additional Recommendations: Funeral Planning/Counseling and Grief/Bereavement Support  Prognosis:   Hours - Days  Discharge Planning: Anticipated Hospital Death      Primary Diagnoses: Present on Admission: . Atrial fibrillation ([place]) . CKD (chronic kidney disease), stage IV ([place]) . Essential hypertension . Hyperlipidemia . Type 2 diabetes mellitus with vascular disease ([place]) . Chronic diastolic CHF (congestive heart failure) ([place]) . Anemia . CAP (community acquired pneumonia) . Epistaxis   I have reviewed the medical  record, interviewed the patient and family, and examined the patient. The following aspects are pertinent.  Past Medical History:  Diagnosis Date  . Alcohol abuse   . Arthritis    "just about qwhere now" (08/28/2015)  . Benign prostatic hyperplasia   . CAD (coronary artery disease) 2005   mLAD ~90%, D1/RI-70, mRCA 70% --> CABG 3: LIMA-LAD, SVG-D1/RI, SVG-dRCA  . HTN (hypertension)   . Hyperlipemia   . Iron deficiency anemia   . Myocardial infarction [facility]) 1995   s/p PTCA  . Obesity   . Peripheral vascular disease ([place])   . PONV (postoperative nausea and vomiting)   . Type II diabetes mellitus ([place])    Social History   Socioeconomic History  . Marital status: Married    Spouse name: None  . Number of children: None  . Years of education: None  . Highest education level: None  Social Needs  . Financial resource strain: None  . Food insecurity - worry: None  . Food insecurity - inability: None  . Transportation needs - medical: None  . Transportation needs - non-medical: None  Occupational History  . None  Tobacco Use  . Smoking status: Former Smoker    Packs/day: 1.50    Years: 35.00    Pack years: 52.50    Types: Cigarettes    Last attempt to quit: 01/08/1994    Years since quitting: 23.5  . Smokeless tobacco: Never Used  Substance and Sexual Activity  . Alcohol use: Yes    Alcohol/week: 16.8 oz    Types: 28 Cans of beer per week    Comment: 2 beers everyday.  . Drug use: No  . Sexual activity: Not Currently  Other Topics Concern  . None  Social History Narrative  . None   Family History  Problem Relation Age of Onset  . Heart attack Mother   . CAD Father    Scheduled Meds: . glycopyrrolate  0.2 mg Intravenous Q8H  . haloperidol lactate  1 mg Intravenous Q8H   Continuous Infusions: . cefTRIAXone (ROCEPHIN)  IV Stopped (07/16/17 1833)  . doxycycline (VIBRAMYCIN) IV Stopped (07/16/17 2339)   PRN Meds:.acetaminophen **OR** acetaminophen,  glycopyrrolate, haloperidol lactate, HYDROmorphone (DILAUDID) injection, levalbuterol, ondansetron **OR** ondansetron (ZOFRAN) IV, phenol, sodium chloride Medications Prior to Admission:  Prior to Admission medications   Medication Sig Start Date End Date Taking? Authorizing Provider  acetaminophen (TYLENOL) 325 MG tablet Take 2 tablets (650 mg total) by mouth every 4 (four) hours as needed for headache or mild pain. 09/28/16  Yes Kilroy, Doreene Burke, PA-C  amiodarone (PACERONE) 200 MG tablet Take 1 tablet (200 mg total) by mouth daily. 05/18/17  Yes Belva Crome, MD  aspirin EC 81 MG tablet Take 81 mg by mouth at bedtime.    Yes [provider]  cloNIDine (CATAPRES - DOSED IN MG/24 HR) 0.1 mg/24hr patch Place 0.1 mg onto the skin once a week. On Wednesday   Yes [provider]  Coenzyme Q10 (CO Q-10)  100 MG CAPS Take 100 mg by mouth daily.   Yes [provider]  ezetimibe (ZETIA) 10 MG tablet Take 5 mg by mouth daily.   Yes [provider]  ferrous sulfate 325 (65 FE) MG tablet Take 325 mg by mouth at bedtime.    Yes [provider]  ibuprofen (ADVIL,MOTRIN) 200 MG tablet Take 400 mg by mouth 2 (two) times daily as needed for mild pain.   Yes [provider]  nitroGLYCERIN (NITROSTAT) 0.4 MG SL tablet Place 0.4 mg under the tongue every 5 (five) minutes as needed for chest pain.   Yes [provider]  warfarin (COUMADIN) 2.5 MG tablet Take 1.25-2.5 mg by mouth See admin instructions. Take 1/2 tablet on Tuesday then take 1 tablet all the other days   Yes [provider]  docusate sodium (COLACE) 50 MG capsule Take 1 capsule (50 mg total) by mouth 2 (two) times daily. Patient not taking: Reported on 07/10/2017 01/20/17 01/15/18  Isaiah Serge, NP  furosemide (LASIX) 80 MG tablet Take 1 tablet (80 mg total) by mouth daily. Patient not taking: Reported on 07/01/2017 05/19/17 05/14/18  Belva Crome, MD  warfarin (COUMADIN) 2.5 MG tablet  TAKE AS DIRECTED BY COUMADIN CLINIC Patient not taking: Reported on 07/21/2017 05/30/17   Belva Crome, MD   Allergies  Allergen Reactions  . 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   . Other Swelling, Rash and Other (See Comments)    Pt states that he is allergic to all -mycins.   Reaction:  Leg swelling   . Statins Other (See Comments)    Reaction:  Muscle pain   . Hydralazine Dermatitis  . Metoprolol Swelling    Edema and redness in legs  . Fish Oil Hives    Pt reports no reaction to fish (only fish oil capsules)  . Norvasc [Amlodipine] Swelling and Rash    Reaction:  Leg swelling    Review of Systems  Unable to perform ROS: Acuity of condition    Physical Exam  Constitutional:  Acutely ill-appearing older man; transitioning towards end of life, unresponsive  HENT:  Head: Normocephalic and atraumatic.  Cardiovascular:  Anasarca A. fib  Pulmonary/Chest:  Increased work of breathing; wearing 100% nonrebreather  Abdominal: Soft.  Genitourinary:  Genitourinary Comments: Foley catheter  Neurological:  Unresponsive  Skin: Skin is warm and dry. There is pallor.  Psychiatric:  Unable to test  Nursing note and vitals reviewed.   Vital Signs: BP 117/62   Pulse 79   Temp (!) 97.5 F (36.4 C) (Axillary)   Resp (!) 31   Ht '5\' 8"'$  (1.727 m)   Wt 76.2 kg (167 lb 15.9 oz)   SpO2 95%   BMI 25.54 kg/m  Pain Assessment: No/denies pain   Pain Score: 0-No pain   SpO2: SpO2: 95 % O2 Device:SpO2: 95 % O2 Flow Rate: .O2 Flow Rate (L/min): 15 L/min  IO: Intake/output summary:   Intake/Output Summary (Last 24 hours) at 07/29/2017 1150 Last data filed at 07/16/2017 1800 Gross per 24 hour  Intake 100 ml  Output 750 ml  Net -650 ml    LBM: Last BM Date: 07/24/2017 Baseline Weight: Weight: 76.2 kg (168 lb) Most recent weight: Weight: 76.2 kg (167 lb 15.9 oz)     Palliative Assessment/Data:   Flowsheet Rows     Most  Recent Value  Intake Tab  Referral Department  Hospitalist  Unit at Time of Referral  Intermediate Care Unit  Palliative Care Primary Diagnosis  Sepsis/Infectious Disease  Date Notified  07/16/17  Reason for referral  Pain, Non-pain Symptom, End of Life Care Assistance, Psychosocial or Spiritual support  Date of Admission  07/16/2017  Date first seen by Palliative Care  08/05/17  # of days Palliative referral response time  1 Day(s)  # of days IP prior to Palliative referral  5  Clinical Assessment  Palliative Performance Scale Score  20%  Pain Max last 24 hours  Not able to report  Pain Min Last 24 hours  Not able to report  Dyspnea Max Last 24 Hours  Not able to report  Dyspnea Min Last 24 hours  Not able to report  Nausea Max Last 24 Hours  Not able to report  Nausea Min Last 24 Hours  Not able to report  Anxiety Max Last 24 Hours  Not able to report  Anxiety Min Last 24 Hours  Not able to report  Other Max Last 24 Hours  Not able to report  Psychosocial & Spiritual Assessment  Palliative Care Outcomes  Patient/Family meeting held?  Yes  Who was at the meeting?  Wife son and daughter  Palliative Care Outcomes  Improved pain interventions, Improved non-pain symptom therapy, Changed to focus on comfort, Provided psychosocial or spiritual support, Provided end of life care assistance, Clarified goals of care  Patient/Family wishes: Interventions discontinued/not started   Mechanical Ventilation, BiPAP, Vasopressors, NIPPV, Trach, PEG, Hemodialysis, Transfusion, Tube feedings/TPN, Other (Comment)      Time In: 0830 Time Out: 1000 Time Total: 90 min Greater than 50%  of this time was spent counseling and coordinating care related to the above assessment and plan. Staffed with Dr. Alfredia Ferguson  Signed by: Dory Horn, NP   Please contact Palliative Medicine Team phone at [phone] for questions and concerns.  For individual provider: See Shea Evans

## 2017-07-29 NOTE — Progress Notes (Signed)
Spoke to wife Abdimalik Mountford made her aware of physical changes noted in her husband, [name] decided to come in and sit by his side.  As stated earlier, [facility] notified of changes noted.  Will continue to monitor patient's condition and safety.

## 2017-07-29 NOTE — Progress Notes (Signed)
Robinul not given to patient. Patient deceased before it could be administered.  Gave Robinul to Joylene Draft with Pharmacy. She will return to stock.

## 2017-07-29 NOTE — Death Summary Note (Signed)
DEATH SUMMARY   Patient Details  Name: [name] MRN: [id number] DOB: 11-12-1940  Admission/Discharge Information   Admit Date:  2017/07/29  Date of Death: Date of Death: 2017/08/04  Time of Death: Time of Death: 25-Oct-1030  Length of Stay: 6  Referring Physician: Lupita Raider, MD   Reason(s) for Hospitalization  Shortness of Breath, Low Hemoglobin and Elevated INR  Diagnoses  Preliminary cause of death:    Cardiopulmonary Arrest from Acute Respiratory Failure with Hypoxia related to Decompensated Combined CHF and Community Acquired Pneumonia  Secondary Diagnoses (including complications and co-morbidities):   Acute Respiratory Failure with Hypoxia:  Community-Acquired Pneumonia:  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:   Acute on stage III chronic kidney disease and now with Uremia  Paroxysmal Atrial Fibrillation:  Supratherapeutic INR:  Accelerated hypertension  CAD status post CABG/troponin elevation:  Epistaxis:   Acute Blood Loss Anemia:  Traumatic right ring finger laceration without tendon involvement & right small finger open distal phalanx fracture  Leukocytosis  Hyperkalemia  Active Problems:   Essential hypertension   Hyperlipidemia   CKD (chronic kidney disease), stage IV (HCC)   Type 2 diabetes mellitus with vascular disease (HCC)   Atrial fibrillation (HCC)   Goals of care, counseling/discussion   Chronic diastolic CHF (congestive heart failure) (HCC)   Anemia   CAP (community acquired pneumonia)   Epistaxis   Acute renal failure (HCC)   Hypoxia   Palliative care by specialist   Brief Hospital Course (including significant findings, care, treatment, and services provided and events leading to death)  [name] is a 77 y.o. year old male with PMH of HTN, DM, PAD, CAD status post CABG (Dr. Verdis Prime, Cardiology), stage III CKD (baseline creatinine 1.2-2.5, Dr. Elvis Coil. Nephrology), Atrial  fibrillation on Coumadin, chronic systolic and diastolic CHF, sent to ED from ENT office 07/29/2017 due to dyspnea/hypoxia, anemia and chest x-ray showed multifocal airspace disease concerning for pneumonia versus hemorrhage. Admitted to stepdown for acute respiratory failure with hypoxia, decompensated CHF, pneumonia versus pulmonary hemorrhage, acute on chronic kidney disease, acute blood loss anemia. Unfortunately while waiting in the ED, finger got caught in wheelchair causing partial amputation of distal fifth digit and underwent urgent ORIF by hand surgery. Cardiology and Nephrology consulted given his CHF and AKI. Patient refused dialysis and started rapidly deteriorating becoming more uremic. He got to the point where he he stopped eating.After discussing with Cardiology andNephrology, consultedPalliativeCareTeam to assist with goals of care and transition to hospice.  Unfortunately patient deteriorated even more rapidly overnight and had mental status changes, restlessness, and had increased O2 requirements. This AM patient started desaturated further. Palliative evaluated and after discussion with the family was in the process of being transitioned to comfort care but patient deteriorated further and passed away at 10:32 AM. Family was at bedside when he passed away.   Pertinent Labs and Studies  Significant Diagnostic Studies Dg Chest 2 View  Result Date: 07/29/2017 CLINICAL DATA:  Hemoptysis which shortness-of-breath and fever. EXAM: CHEST  2 VIEW COMPARISON:  09/23/2016 FINDINGS: Sternotomy wires unchanged. Lungs are adequately inflated demonstrate patchy hazy airspace opacification over the mid to lower lungs likely due to multifocal pneumonia, although hemorrhage could also have this appearance. Mild blunting of the right costophrenic angle unchanged. Mild stable cardiomegaly. Calcified plaque over the aortic arch. Remainder of the exam is unchanged. IMPRESSION: Hazy multifocal airspace  process over the mid to lower lungs likely multifocal pneumonia, although hemorrhage could have this appearance.  Minimal stable blunting of the right costophrenic angle. Stable cardiomegaly. Electronically Signed   By: Elberta Fortis M.D.   On: 07/22/2017 12:40   Dg Chest Port 1 View  Result Date: 07/21/2017 CLINICAL DATA:  Shortness of breath. EXAM: PORTABLE CHEST 1 VIEW COMPARISON:  Chest x-ray dated July 14, 2017. FINDINGS: Stable cardiomegaly status post CABG. Prominent interstitial opacities throughout both lungs are similar to prior study. Worsening alveolar opacities in both mid to lower lungs. Increased small layering bilateral pleural effusions. No pneumothorax. No acute osseous abnormality. IMPRESSION: 1. Worsening airspace disease in both mid to lower lungs, which could reflect alveolar edema or multifocal pneumonia. 2. Increased small layering bilateral pleural effusions. Electronically Signed   By: Obie Dredge M.D.   On: 07/08/2017 09:31   Dg Chest Port 1 View  Result Date: 07/14/2017 CLINICAL DATA:  Hypoxia EXAM: PORTABLE CHEST 1 VIEW COMPARISON:  07/19/2017 and September 23, 2016 FINDINGS: There is extensive interstitial edema throughout the lungs diffusely. There is airspace opacity throughout portions of the left mid and lower lung zones as well as in the right base. Note that there has been partial clearing of airspace opacity from the right mid lower lung zones. There are minimal pleural effusions bilaterally. There is cardiomegaly with pulmonary venous hypertension. Patient is status post coronary artery bypass grafting. There is aortic atherosclerosis. No evident adenopathy. There is extensive degenerative change in both shoulders, more severe on the left than on the right, stable. IMPRESSION: Pulmonary vascular congestion. Minimal pleural effusions bilaterally. Areas of airspace opacity bilaterally, stable on the left with partial clearing on the right. There is extensive  underlying interstitial edema, stable. The present changes may be due entirely to congestive heart failure with partial clearing of alveolar edema on the right. There may also be a degree of superimposed pneumonia, currently more severe on the left than on the right. Both congestive heart failure and pneumonia may be present concurrently. Patient is status post coronary artery bypass grafting. There is aortic atherosclerosis. Aortic Atherosclerosis (ICD10-I70.0). Electronically Signed   By: Bretta Bang III M.D.   On: 07/14/2017 09:33   Dg Chest Port 1 View  Result Date: 07/23/2017 CLINICAL DATA:  Acute onset of hypoxia.  Evaluate for aspiration. EXAM: PORTABLE CHEST 1 VIEW COMPARISON:  Chest radiograph performed earlier today at 12:44 p.m. FINDINGS: There is significantly worsening bilateral airspace opacification. A small right pleural effusion is noted. This may reflect diffuse pneumonia or pulmonary edema. No pneumothorax is seen. The mediastinal silhouette is enlarged. The patient is status post median sternotomy, with evidence of prior CABG. No acute osseous abnormalities are seen. IMPRESSION: 1. Significantly worsening bilateral airspace opacification; this may reflect diffuse pneumonia or pulmonary edema. Small right pleural effusion noted. 2. Cardiomegaly. Electronically Signed   By: Roanna Raider M.D.   On: 07/20/2017 23:05   Dg Hand Complete Right  Result Date: 07/20/2017 CLINICAL DATA:  77 year old male with a history injury to hand. Lacerations. EXAM: RIGHT HAND - COMPLETE 3+ VIEW COMPARISON:  None. FINDINGS: Acute fracture of the distal aspect of the distal phalanx fifth digit right hand. Soft tissue disruption on the dorsal aspect of the fourth digit near the distal interphalangeal joint and the proximal interphalangeal joint. No radiopaque foreign body. Soft tissue injury involving the fifth digit of the right hand, with no radiopaque foreign body. Degenerative changes of the  interphalangeal joints. Extensive arterial calcifications of the right hand. IMPRESSION: Acute fracture of the distal phalanx of the fifth  digit right hand with associated soft tissue disruption/injury. Evidence of soft tissue injury on the dorsal aspect of the fourth digit, right hand, overlying the DIP and PIP. Extensive calcifications of the arteries of the right hand. Electronically Signed   By: Gilmer Mor D.O.   On: 07/16/2017 17:08   Vas US Renal Artery Duplex  Result Date: 07/13/2017 ABDOMINAL VISCERAL Other Indication: Hypertension with known advanced chronic kidney disease. High Risk Factors: Coronary artery disease.  Limitations: Air/bowel gas.  Examination Guidelines: A complete evaluation includes B-mode imaging, spectral doppler, color doppler, and power doppler as needed of all accessible portions of each vessel. Bilateral testing is considered an integral part of a complete examination. Limited examinations for reoccurring indications may be performed as noted.  Duplex Findings: +--------------------+--------+--------+------+ Mesenteric          PSV cm/sEDV cm/sPlaque +--------------------+--------+--------+------+ Aorta Prox             96                  +--------------------+--------+--------+------+ Celiac Artery Origin  186                  +--------------------+--------+--------+------+ SMA Proximal          301                  +--------------------+--------+--------+------+   +------------------+--------+--------+-------+ Right Renal ArteryPSV cm/sEDV cm/sComment +------------------+--------+--------+-------+ Origin             85.00   23.00          +------------------+--------+--------+-------+ Proximal           56.30   10.10          +------------------+--------+--------+-------+ Mid                64.00   10.10          +------------------+--------+--------+-------+ Distal             53.00   11.80           +------------------+--------+--------+-------+ Hilum              38.00   11.00          +------------------+--------+--------+-------+ +-----------------+--------+--------+-------+ Left Renal ArteryPSV cm/sEDV cm/sComment +-----------------+--------+--------+-------+ Origin            72.00   11.00          +-----------------+--------+--------+-------+ Proximal          67.00   14.40          +-----------------+--------+--------+-------+ MId               71.80   12.60          +-----------------+--------+--------+-------+ Distal            58.00    9.57          +-----------------+--------+--------+-------+ Hilum             31.00    7.00          +-----------------+--------+--------+-------+  RT Kidney Length 11.00  LT Kidney Length 12.10 +------------+--------+--------+----+-----------+-----+----+--+ Right KidneyPSV cm/sEDV cm/s RI Left Kidney PSV EDV RI +------------+--------+--------+----+-----------+-----+----+--+ Upper Pole   27.50    8.60  0.69Upper Pole 28.30       +------------+--------+--------+----+-----------+-----+----+--+ Mid           26.0     0            Mid    [phone]   +------------+--------+--------+----+-----------+-----+----+--+ Lower  Pole    21.0     0        Lower Pole [phone]   +------------+--------+--------+----+-----------+-----+----+--+ +------------+----++-----------+----+ Right Kidney    Left Kidney     +------------+----++-----------+----+ Cortex      16/7Cortex     12/4 +------------+----++-----------+----+ FINAL INTERPRETATION: Renal:  Right: No evidence of right renal artery stenosis. Abnormal right        Resistive Index. Diffuse increased echogenicity of the renal        parenchyma. Left:  No evidence of left renal artery stenosis. Abnormal left        Resisitve Index. Diffuse increased echogenicity of the renal        parenchyma. 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  *See table(s) above for measurements and observations.  Diagnosing physician: Fabienne Bruns  Electronically signed by Fabienne Bruns on 07/13/2017 at 8:00:25 PM.  ** Final **   Microbiology Recent Results (from the past 240 hour(s))  Blood Culture (routine x 2)     Status: None   Collection Time: 07/08/2017  5:27 PM  Result Value Ref Range Status   Specimen Description BLOOD SITE NOT SPECIFIED  Final   Special Requests   Final    BOTTLES DRAWN AEROBIC AND ANAEROBIC Blood Culture adequate volume   Culture   Final    NO GROWTH 5 DAYS Performed at [facility], [address], [place], [place] [address]    Report Status 07/16/2017 FINAL  Final  Respiratory Panel by PCR     Status: None   Collection Time: 07/13/2017  9:16 PM  Result Value Ref Range Status   Adenovirus NOT DETECTED NOT DETECTED Final   Coronavirus 229E NOT DETECTED NOT DETECTED Final   Coronavirus HKU1 NOT DETECTED NOT DETECTED Final   Coronavirus NL63 NOT DETECTED NOT DETECTED Final   Coronavirus OC43 NOT DETECTED NOT DETECTED Final   Metapneumovirus NOT DETECTED NOT DETECTED Final   Rhinovirus / Enterovirus NOT DETECTED NOT DETECTED Final   Influenza A NOT DETECTED NOT DETECTED Final   Influenza A H1 NOT DETECTED NOT DETECTED Final   Influenza A H1 2009 NOT DETECTED NOT DETECTED Final   Influenza A H3 NOT DETECTED NOT DETECTED Final   Influenza B NOT DETECTED NOT DETECTED Final   Parainfluenza Virus 1 NOT DETECTED NOT DETECTED Final   Parainfluenza Virus 2 NOT DETECTED NOT DETECTED Final   Parainfluenza Virus 3 NOT DETECTED NOT DETECTED Final   Parainfluenza Virus 4 NOT DETECTED NOT DETECTED Final   Respiratory Syncytial Virus NOT DETECTED NOT DETECTED Final   Bordetella pertussis NOT DETECTED NOT DETECTED Final   Chlamydophila pneumoniae NOT DETECTED NOT DETECTED Final   Mycoplasma pneumoniae NOT DETECTED NOT DETECTED Final    Comment: Performed at [facility], [address] [place],  [place] [address]  Urine Culture     Status: None   Collection Time: 07/04/2017 10:42 PM  Result Value Ref Range Status   Specimen Description URINE, CLEAN CATCH  Final   Special Requests Normal  Final   Culture   Final    NO GROWTH Performed at [facility], [address] [place], [place] [address]    Report Status 07/13/2017 FINAL  Final  MRSA PCR Screening     Status: None   Collection Time: 07/12/17  1:00 AM  Result Value Ref Range Status   MRSA by PCR NEGATIVE NEGATIVE Final    Comment:  The GeneXpert MRSA Assay (FDA approved for NASAL specimens only), is one component of a comprehensive MRSA colonization surveillance program. It is not intended to diagnose MRSA infection nor to guide or monitor treatment for MRSA infections. Performed at [facility], [address], [place], [place] [address]   Culture, blood (Routine X 2) w Reflex to ID Panel     Status: None (Preliminary result)   Collection Time: 07/12/17  4:13 AM  Result Value Ref Range Status   Specimen Description BLOOD RIGHT ARM  Final   Special Requests IN PEDIATRIC BOTTLE Blood Culture adequate volume  Final   Culture   Final    NO GROWTH 4 DAYS Performed at [facility], [address], [place], [place] [address]    Report Status PENDING  Incomplete   Consultants:   Cardiology  Nephrology  Hand Surgery  Palliative Care Medicine   Anti-infectives (From admission, onward)   Start     Dose/Rate Route Frequency Ordered Stop   07/12/17 1800  cefTRIAXone (ROCEPHIN) 1 g in sodium chloride 0.9 % 100 mL IVPB     1 g 200 mL/hr over 30 Minutes Intravenous Every 24 hours 07/12/17 0056 07/19/17 1759   07/12/17 0200  azithromycin (ZITHROMAX) 500 mg in sodium chloride 0.9 % 250 mL IVPB  Status:  Discontinued     500 mg 250 mL/hr over 60 Minutes Intravenous Every 24 hours 07/12/17 0056 07/12/17 0900   07/12/17 0045  vancomycin (VANCOCIN) 1,500 mg in sodium chloride 0.9 % 500 mL IVPB  Status:   Discontinued     1,500 mg 250 mL/hr over 120 Minutes Intravenous  Once 07/12/17 0039 07/12/17 0857   2017/07/13 2100  doxycycline (VIBRAMYCIN) 100 mg in sodium chloride 0.9 % 250 mL IVPB     100 mg 125 mL/hr over 120 Minutes Intravenous Every 12 hours Jul 13, 2017 2020     2017-07-13 1815  cefTRIAXone (ROCEPHIN) 1 g in sodium chloride 0.9 % 100 mL IVPB     1 g 200 mL/hr over 30 Minutes Intravenous  Once July 13, 2017 1808 2017-07-13 2002   July 13, 2017 1815  azithromycin (ZITHROMAX) 500 mg in sodium chloride 0.9 % 250 mL IVPB  Status:  Discontinued     500 mg 250 mL/hr over 60 Minutes Intravenous  Once 07-13-17 1809 07-13-2017 2017-09-21     DEATH EXAM Eyes: Pupils Fixed and Dilated. No Blink response to threat Heart: No Auscultated Heart Sounds after listening for over 1 minute; No pulses Lungs: No spontaneous respirations. No auscultated Lung sounds Skin: Cool and dry  Lab Basic Metabolic Panel: Recent Labs  Lab 07/12/17 0338 07/13/17 0417 07/14/17 0449 07/15/17 0638 07/16/17 0440 07/18/2017 0459  NA 140 137 134* 135 135 135  135  K 4.7 4.8 4.9 4.8 5.4* 5.0  5.1  CL 111 108 105 104 102 103  103  CO2 15* 15* 14* 13* 15* 14*  13*  GLUCOSE 154* 125* 131* 132* 139* 155*  152*  BUN 72* 78* 84* 98* 114* 132*  131*  CREATININE 7.32* 7.52* 7.79* 8.49* 8.80* 9.59*  9.60*  CALCIUM 7.8* 8.1* 8.3* 8.6* 8.7* 8.3*  8.3*  MG 1.9  --   --   --   --  2.1  PHOS 5.6* 5.8* 6.9* 7.9* 9.0* 11.0*  10.9*   Liver Function Tests: Recent Labs  Lab 07/12/17 0338  07/13/17 1129 07/14/17 0449 07/15/17 0638 07/16/17 0440 07/14/2017 0459  AST 28  --  35  --   --   --  561*  ALT 19  --  21  --   --   --  325*  ALKPHOS 83  --  95  --   --   --  124  BILITOT 1.1  --  2.0*  --   --   --  2.5*  PROT 5.4*  --  5.6*  --   --   --  5.5*  ALBUMIN 2.1*   < > 2.1* 2.0* 2.1* 2.0* 2.0*  2.0*   < > = values in this interval not displayed.   No results for input(s): LIPASE, AMYLASE in the last 168 hours. No results for  input(s): AMMONIA in the last 168 hours. CBC: Recent Labs  Lab 07/13/17 1535 07/14/17 0449 07/15/17 0638 07/16/17 0440 07/20/2017 0459  WBC 18.1* 19.1* 20.6* 18.7* 19.1*  NEUTROABS  --   --   --   --  17.9*  HGB 7.6* 7.5* 8.1* 8.0* 8.0*  HCT 23.0* 22.5* 24.6* 24.3* 24.7*  MCV 92.0 92.2 92.8 92.7 92.9  PLT 324 348 393 397 358   Cardiac Enzymes: Recent Labs  Lab 2017-08-01 2242 07/12/17 0338 07/12/17 0937  TROPONINI 0.11* 0.13* 0.13*   Sepsis Labs: Recent Labs  Lab 2017-08-01 1748  07/14/17 0449 07/15/17 0638 07/16/17 0440 07/01/2017 0459  WBC  --    < > 19.1* 20.6* 18.7* 19.1*  LATICACIDVEN 1.03  --   --   --   --   --    < > = values in this interval not displayed.    Procedures/Operations   Surigcal Repair ORIF 2017/08/01  ECHOCARDIOGRAM ------------------------------------------------------------------- Study Conclusions  - Left ventricle: The cavity size was mildly dilated. There was mild concentric hypertrophy. Systolic function was mildly reduced. The estimated ejection fraction was in the range of 45% to 50%. Hypokinesis of the basal-midinferolateral and inferior myocardium. Features are consistent with a pseudonormal left ventricular filling pattern, with concomitant abnormal relaxation and increased filling pressure (grade 2 diastolic dysfunction). - Ventricular septum: Septal motion showed paradox. These changes are consistent with a post-thoracotomy state. - Mitral valve: There was mild to moderate regurgitation. - Left atrium: The atrium was mildly dilated. - Right ventricle: Systolic function was moderately reduced. - Right atrium: The atrium was mildly dilated. - Tricuspid valve: There was moderate regurgitation directed centrally. - Pulmonary arteries: Systolic pressure was moderately increased. PA peak pressure: 64 mm Hg (S).  Impressions:  - Re-evaluation of the 2017 echo shows that there was subtle inferior and  infeolateral hypokinesis at that time. There is however definite interval LV dilation and worsening of overall LV systolic function.  Merlene Laughter, DO [facility]  07/25/2017, 12:16 PM

## 2017-07-29 DEATH — deceased

## 2017-09-21 ENCOUNTER — Ambulatory Visit: Payer: Medicare Other | Admitting: Interventional Cardiology

## 2018-03-08 IMAGING — DX DG CHEST 1V PORT
1 series · 1 of 1 positions shown · non-contrast
Comparison: Chest radiograph performed earlier today at [DATE] p.m.

CLINICAL DATA: Acute onset of hypoxia.  Evaluate for aspiration.

EXAM:
PORTABLE CHEST 1 VIEW

[chest ap]
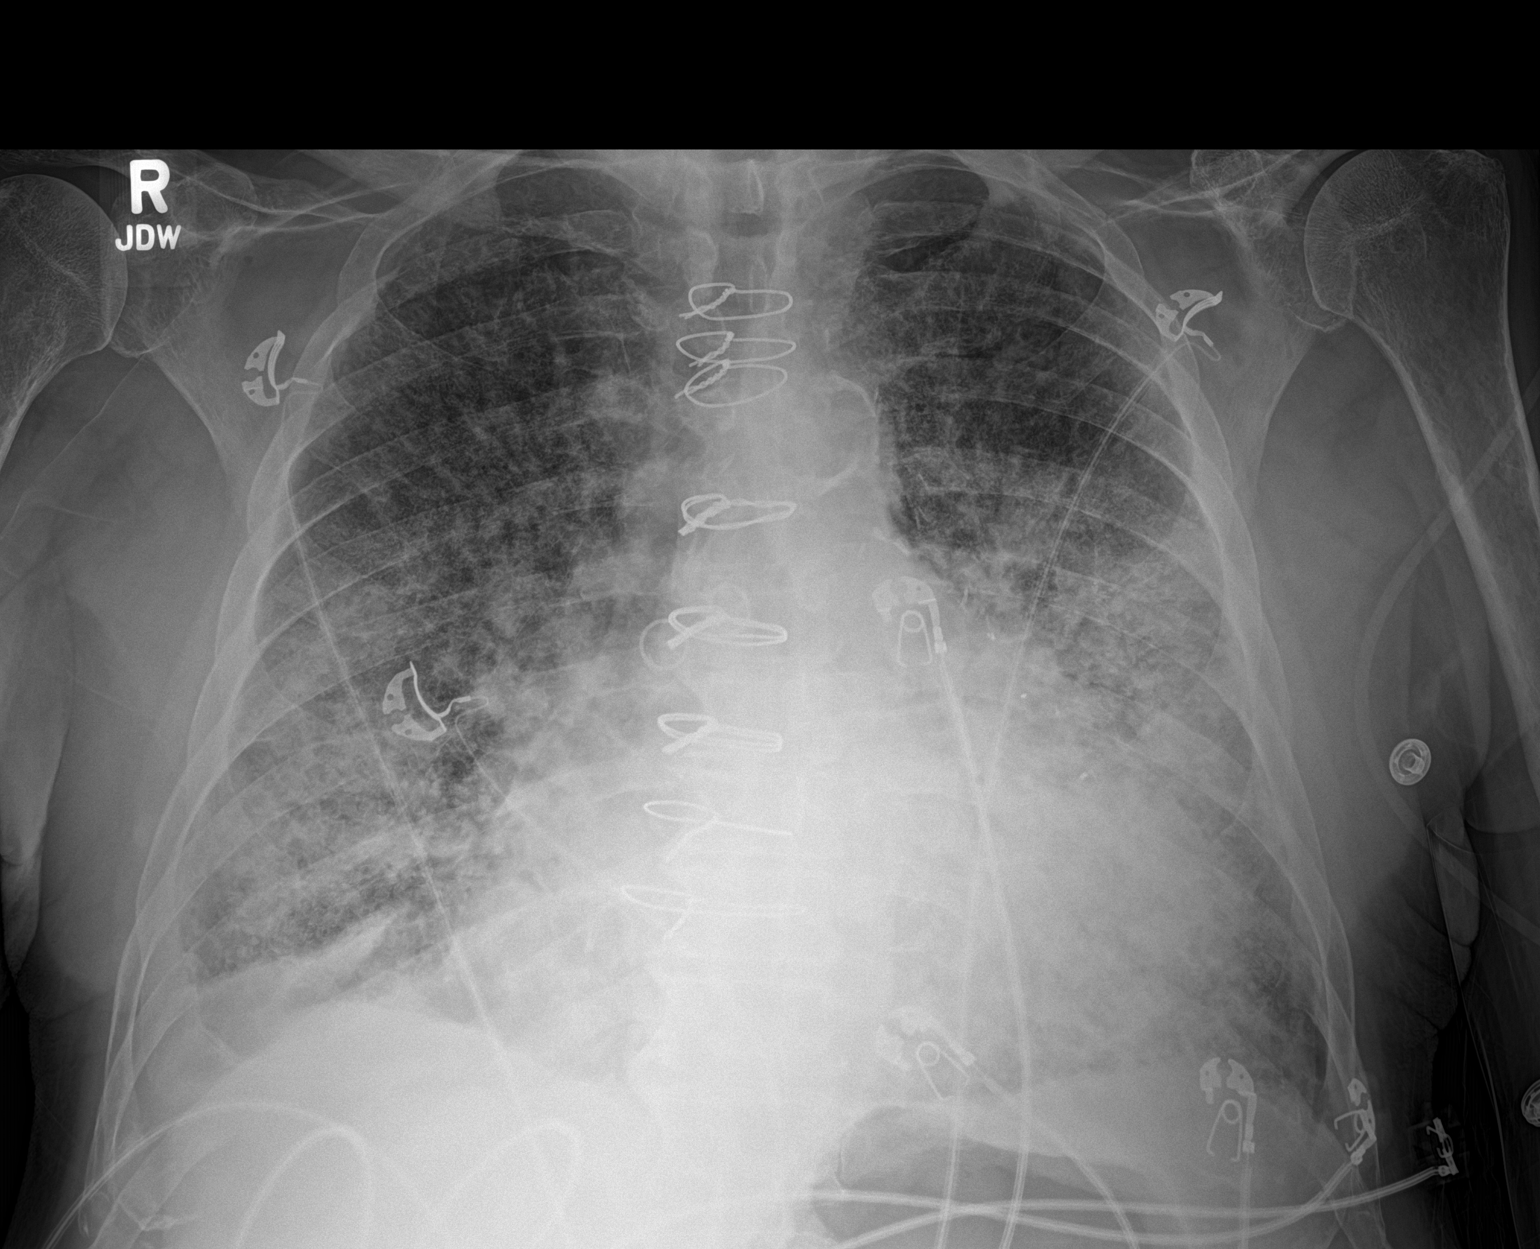

[1 of 1 positions shown; findings below may reference images not displayed]

FINDINGS: There is significantly worsening bilateral airspace opacification. A
small right pleural effusion is noted. This may reflect diffuse
pneumonia or pulmonary edema. No pneumothorax is seen.

The mediastinal silhouette is enlarged. The patient is status post
median sternotomy, with evidence of prior CABG. No acute osseous
abnormalities are seen.
IMPRESSION: 1. Significantly worsening bilateral airspace opacification; this
may reflect diffuse pneumonia or pulmonary edema. Small right
pleural effusion noted.
2. Cardiomegaly.

## 2018-03-08 IMAGING — CR DG CHEST 2V
2 series · 2 of 2 positions shown · non-contrast
Comparison: 09/23/2016

CLINICAL DATA: Hemoptysis which shortness-of-breath and fever.

EXAM:
CHEST  2 VIEW

[w chest pa]
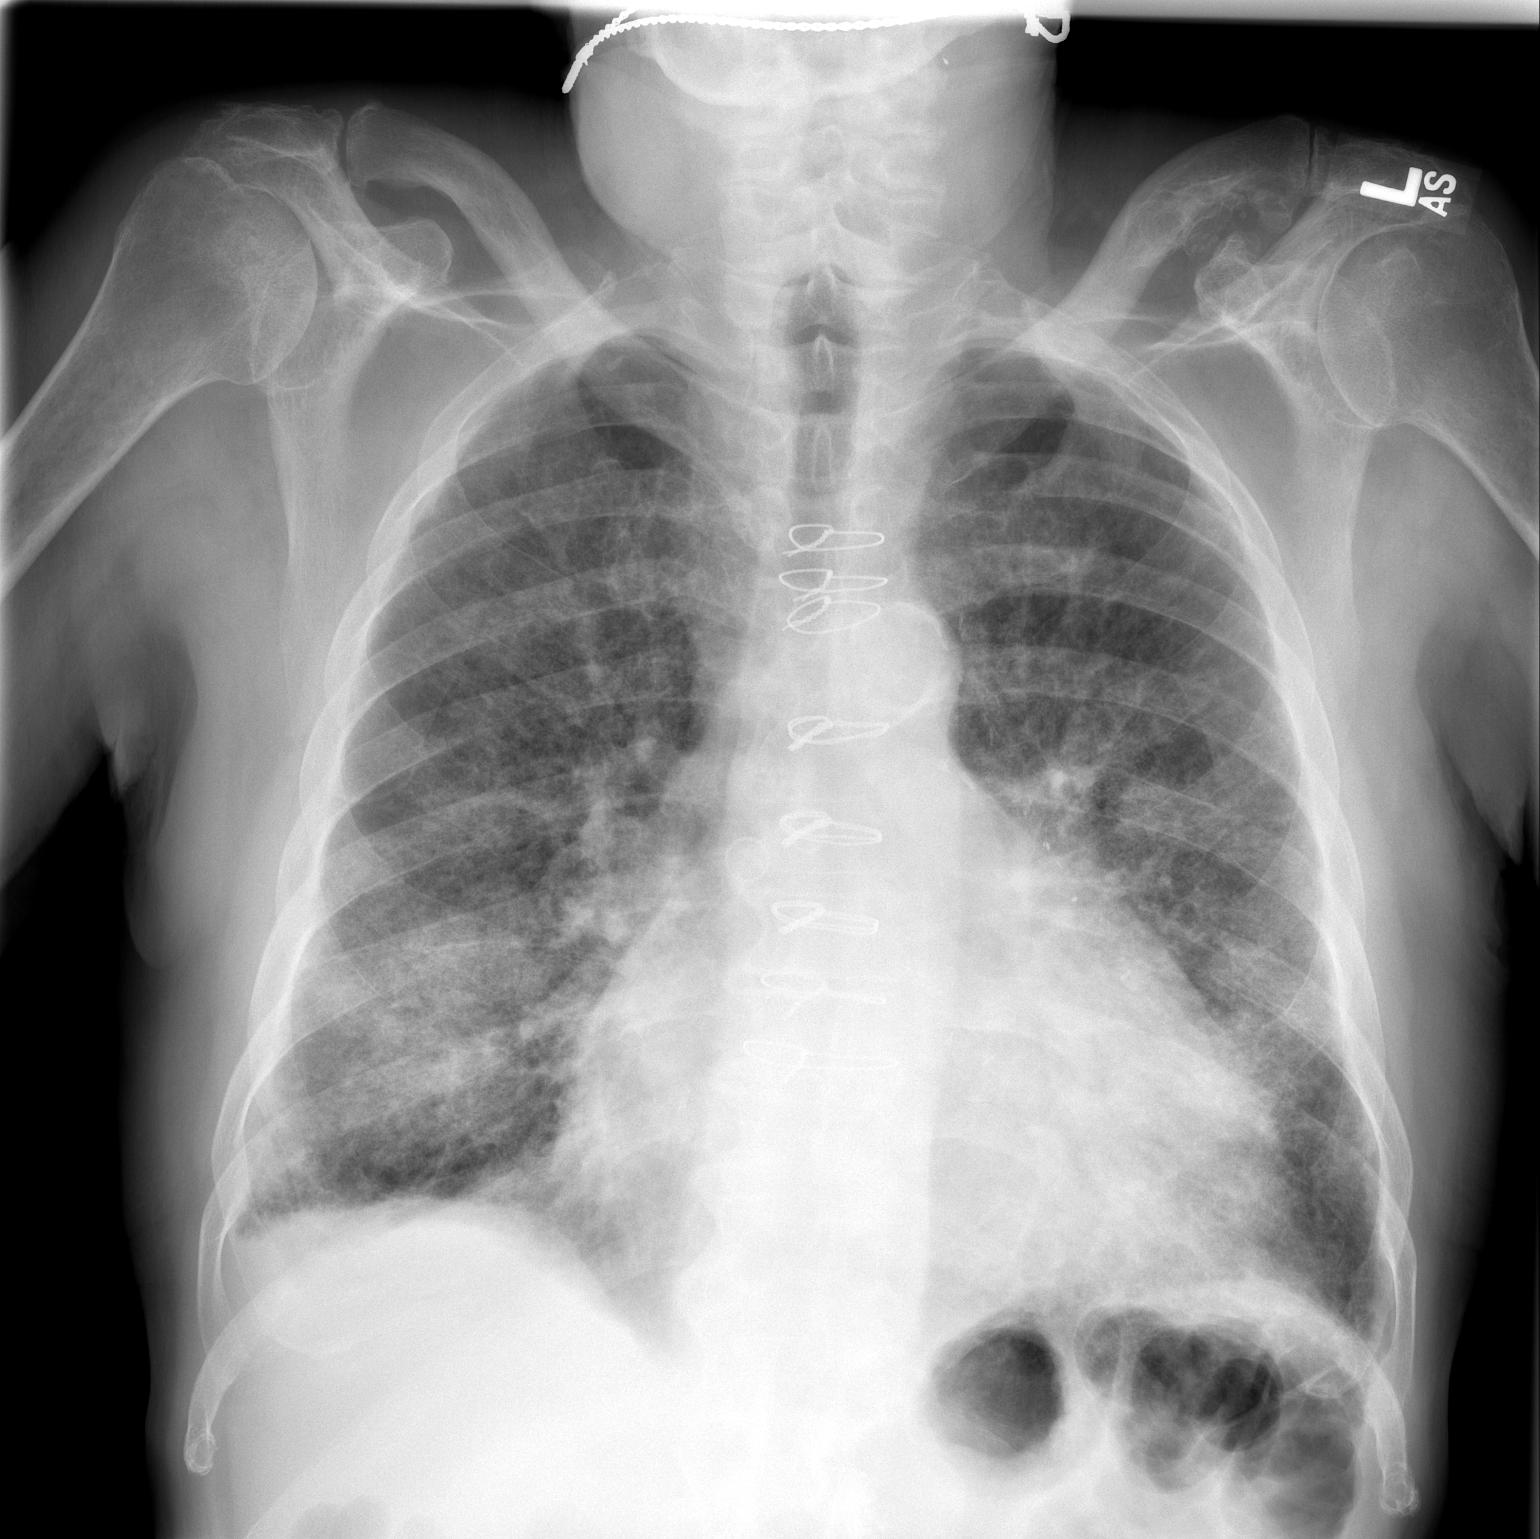

[w chest lat]
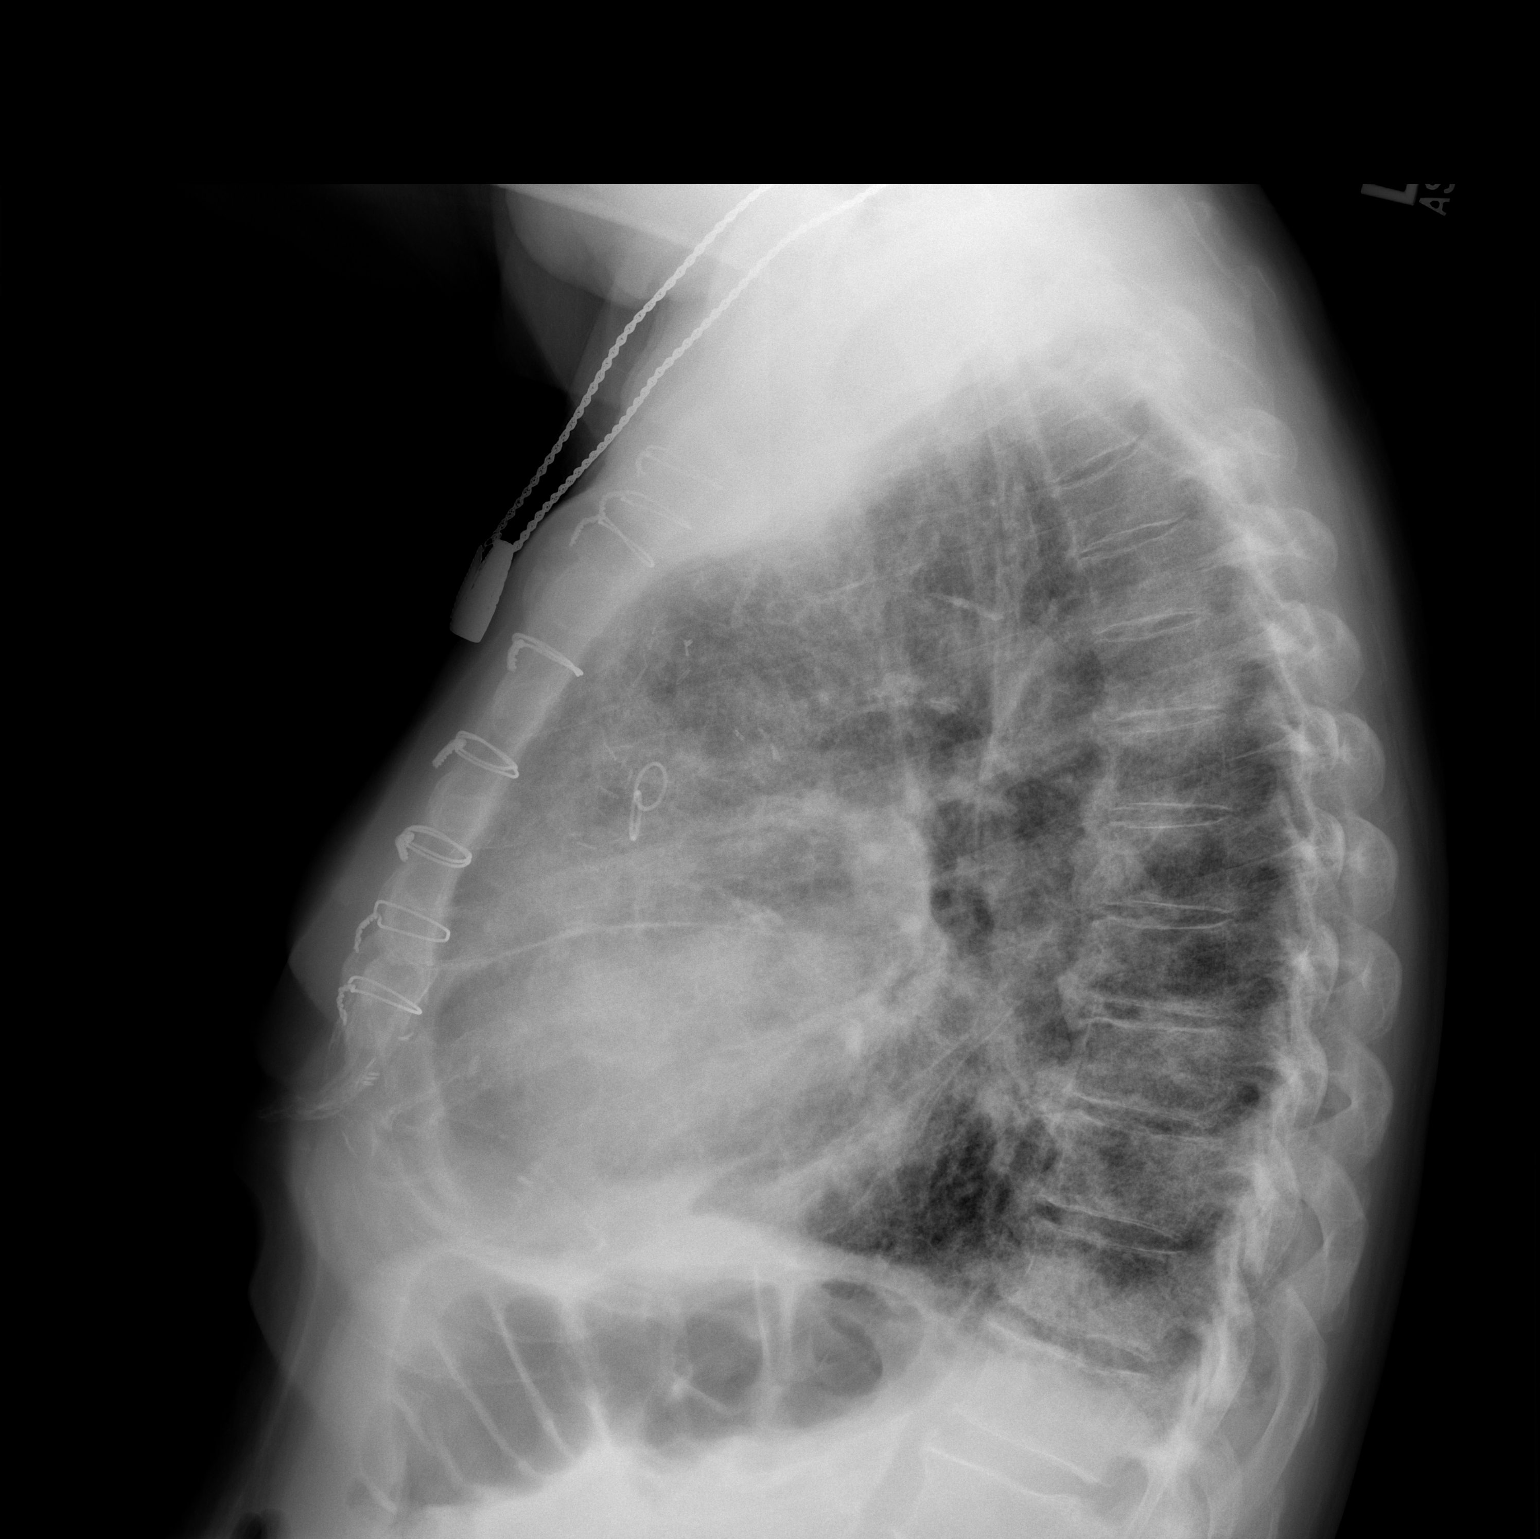

[2 of 2 positions shown; findings below may reference images not displayed]

FINDINGS: Sternotomy wires unchanged. Lungs are adequately inflated
demonstrate patchy hazy airspace opacification over the mid to lower
lungs likely due to multifocal pneumonia, although hemorrhage could
also have this appearance. Mild blunting of the right costophrenic
angle unchanged. Mild stable cardiomegaly. Calcified plaque over the
aortic arch. Remainder of the exam is unchanged.
IMPRESSION: Hazy multifocal airspace process over the mid to lower lungs likely
multifocal pneumonia, although hemorrhage could have this
appearance. Minimal stable blunting of the right costophrenic angle.

Stable cardiomegaly.

## 2018-03-11 IMAGING — DX DG CHEST 1V PORT
1 series · 1 of 1 positions shown · non-contrast
Comparison: July 11, 2017 and September 23, 2016

CLINICAL DATA: Hypoxia

EXAM:
PORTABLE CHEST 1 VIEW

[chest]
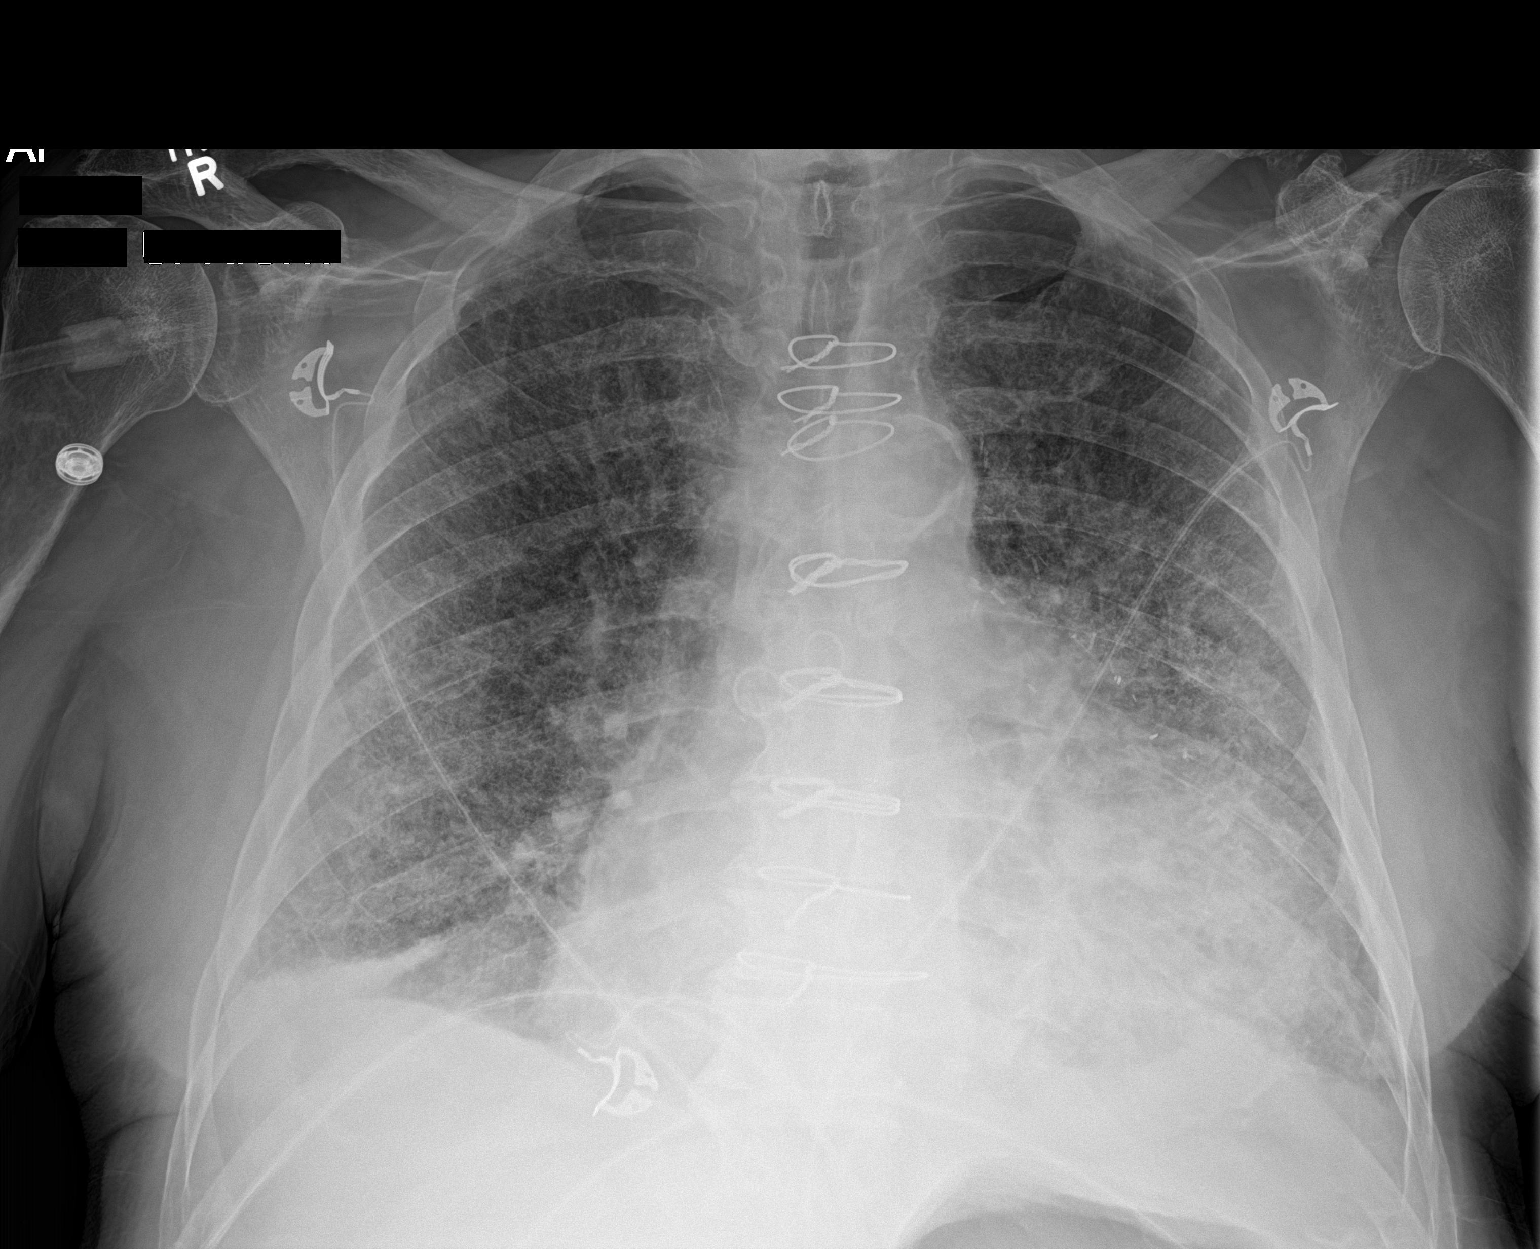

[1 of 1 positions shown; findings below may reference images not displayed]

FINDINGS: There is extensive interstitial edema throughout the lungs
diffusely. There is airspace opacity throughout portions of the left
mid and lower lung zones as well as in the right base. Note that
there has been partial clearing of airspace opacity from the right
mid lower lung zones. There are minimal pleural effusions
bilaterally. There is cardiomegaly with pulmonary venous
hypertension.

Patient is status post coronary artery bypass grafting. There is
aortic atherosclerosis. No evident adenopathy. There is extensive
degenerative change in both shoulders, more severe on the left than
on the right, stable.
IMPRESSION: Pulmonary vascular congestion. Minimal pleural effusions
bilaterally. Areas of airspace opacity bilaterally, stable on the
left with partial clearing on the right. There is extensive
underlying interstitial edema, stable. The present changes may be
due entirely to congestive heart failure with partial clearing of
alveolar edema on the right. There may also be a degree of
superimposed pneumonia, currently more severe on the left than on
the right. Both congestive heart failure and pneumonia may be
present concurrently.

Patient is status post coronary artery bypass grafting. There is
aortic atherosclerosis.

Aortic Atherosclerosis ([id number]).

## 2018-03-14 IMAGING — DX DG CHEST 1V PORT
1 series · 1 of 1 positions shown · non-contrast
Comparison: Chest x-ray dated July 14, 2017.

CLINICAL DATA: Shortness of breath.

EXAM:
PORTABLE CHEST 1 VIEW

[chest ap]
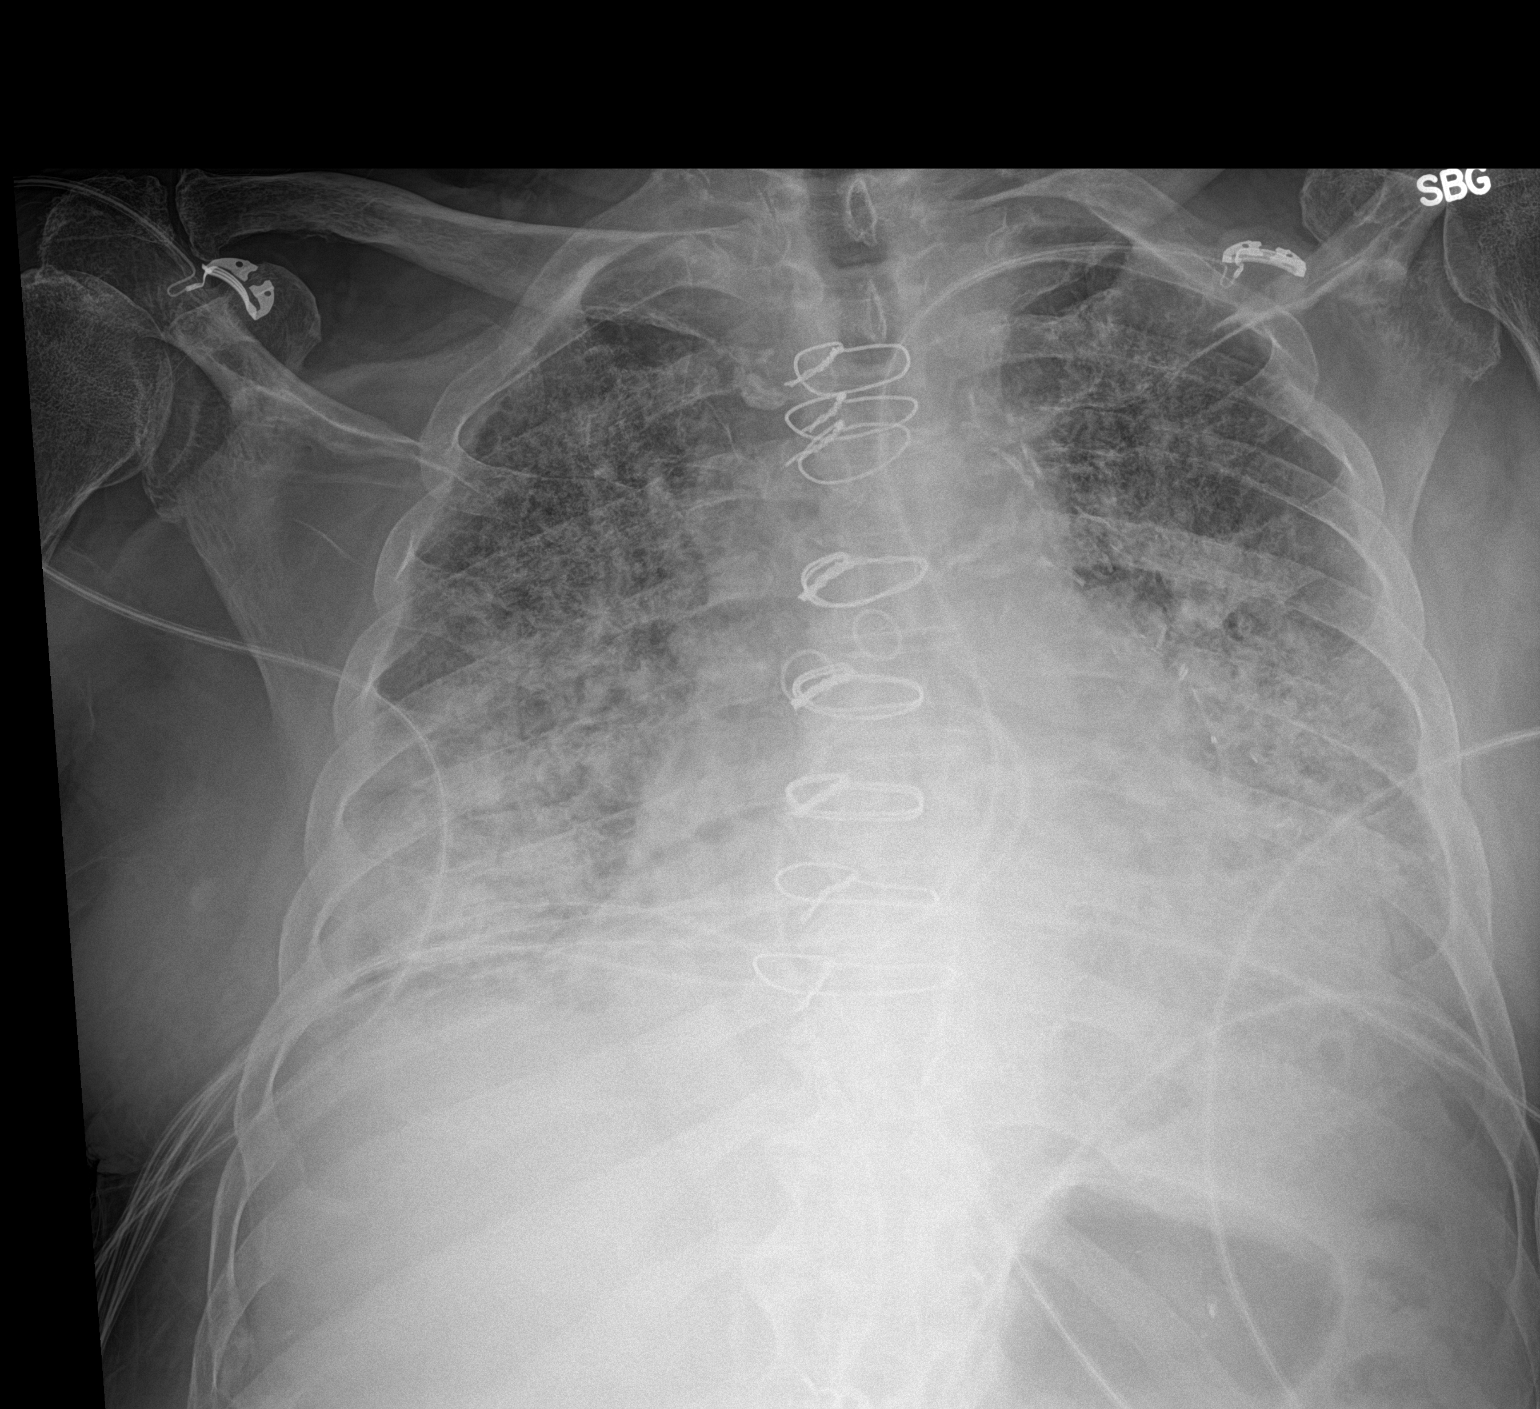

[1 of 1 positions shown; findings below may reference images not displayed]

FINDINGS: Stable cardiomegaly status post CABG. Prominent interstitial
opacities throughout both lungs are similar to prior study.
Worsening alveolar opacities in both mid to lower lungs. Increased
small layering bilateral pleural effusions. No pneumothorax. No
acute osseous abnormality.
IMPRESSION: 1. Worsening airspace disease in both mid to lower lungs, which
could reflect alveolar edema or multifocal pneumonia.
2. Increased small layering bilateral pleural effusions.
